# Patient Record
Sex: Male | Born: 1950
Health system: Southern US, Community
[De-identification: ages and names within clinical notes are randomized; demographics above are authoritative.]

## PROBLEM LIST (undated history)

## (undated) DIAGNOSIS — E119 Type 2 diabetes mellitus without complications: Secondary | ICD-10-CM

## (undated) DIAGNOSIS — N200 Calculus of kidney: Secondary | ICD-10-CM

## (undated) DIAGNOSIS — N189 Chronic kidney disease, unspecified: Secondary | ICD-10-CM

## (undated) DIAGNOSIS — K635 Polyp of colon: Secondary | ICD-10-CM

## (undated) DIAGNOSIS — E0590 Thyrotoxicosis, unspecified without thyrotoxic crisis or storm: Secondary | ICD-10-CM

## (undated) DIAGNOSIS — I10 Essential (primary) hypertension: Secondary | ICD-10-CM

## (undated) HISTORY — PX: CYSTOSCOPY: SUR368

## (undated) HISTORY — PX: LITHOTRIPSY: SUR834

## (undated) HISTORY — PX: COLON SURGERY: SHX602

## (undated) HISTORY — DX: Polyp of colon: K63.5

## (undated) HISTORY — DX: Calculus of kidney: N20.0

## (undated) HISTORY — PX: APPENDECTOMY: SHX54

---

## 1998-04-01 ENCOUNTER — Ambulatory Visit (HOSPITAL_COMMUNITY): Admission: RE | Admit: 1998-04-01 | Discharge: 1998-04-01 | Payer: Self-pay | Admitting: Urology

## 1998-04-01 ENCOUNTER — Encounter: Payer: Self-pay | Admitting: Urology

## 1998-04-03 ENCOUNTER — Ambulatory Visit (HOSPITAL_COMMUNITY): Admission: RE | Admit: 1998-04-03 | Discharge: 1998-04-03 | Payer: Self-pay | Admitting: Urology

## 2000-02-08 ENCOUNTER — Ambulatory Visit (HOSPITAL_COMMUNITY): Admission: RE | Admit: 2000-02-08 | Discharge: 2000-02-08 | Payer: Self-pay | Admitting: Endocrinology

## 2000-02-08 ENCOUNTER — Encounter: Payer: Self-pay | Admitting: Endocrinology

## 2000-11-23 ENCOUNTER — Encounter (INDEPENDENT_AMBULATORY_CARE_PROVIDER_SITE_OTHER): Payer: Self-pay | Admitting: Specialist

## 2000-11-23 ENCOUNTER — Ambulatory Visit (HOSPITAL_COMMUNITY): Admission: RE | Admit: 2000-11-23 | Discharge: 2000-11-23 | Payer: Self-pay | Admitting: *Deleted

## 2003-02-21 ENCOUNTER — Encounter: Admission: RE | Admit: 2003-02-21 | Discharge: 2003-05-22 | Payer: Self-pay | Admitting: *Deleted

## 2003-05-24 ENCOUNTER — Emergency Department (HOSPITAL_COMMUNITY): Admission: EM | Admit: 2003-05-24 | Discharge: 2003-05-24 | Payer: Self-pay | Admitting: Emergency Medicine

## 2004-07-13 ENCOUNTER — Ambulatory Visit (HOSPITAL_COMMUNITY): Admission: RE | Admit: 2004-07-13 | Discharge: 2004-07-13 | Payer: Self-pay | Admitting: *Deleted

## 2005-04-23 ENCOUNTER — Encounter: Admission: RE | Admit: 2005-04-23 | Discharge: 2005-06-20 | Payer: Self-pay | Admitting: *Deleted

## 2008-03-14 ENCOUNTER — Ambulatory Visit (HOSPITAL_COMMUNITY): Admission: RE | Admit: 2008-03-14 | Discharge: 2008-03-14 | Payer: Self-pay | Admitting: Urology

## 2008-03-21 ENCOUNTER — Ambulatory Visit (HOSPITAL_COMMUNITY): Admission: RE | Admit: 2008-03-21 | Discharge: 2008-03-21 | Payer: Self-pay | Admitting: Urology

## 2008-07-04 ENCOUNTER — Encounter: Admission: RE | Admit: 2008-07-04 | Discharge: 2008-07-04 | Payer: Self-pay | Admitting: Family Medicine

## 2008-11-21 DIAGNOSIS — E278 Other specified disorders of adrenal gland: Secondary | ICD-10-CM | POA: Insufficient documentation

## 2008-11-21 DIAGNOSIS — R809 Proteinuria, unspecified: Secondary | ICD-10-CM | POA: Insufficient documentation

## 2010-11-26 ENCOUNTER — Other Ambulatory Visit: Payer: Self-pay | Admitting: Internal Medicine

## 2010-11-26 DIAGNOSIS — D3500 Benign neoplasm of unspecified adrenal gland: Secondary | ICD-10-CM

## 2010-12-04 ENCOUNTER — Ambulatory Visit
Admission: RE | Admit: 2010-12-04 | Discharge: 2010-12-04 | Disposition: A | Discharge status: Home / Self Care | Payer: BC Managed Care – PPO | Source: Ambulatory Visit | Attending: Internal Medicine | Admitting: Internal Medicine

## 2010-12-04 DIAGNOSIS — D3500 Benign neoplasm of unspecified adrenal gland: Secondary | ICD-10-CM

## 2012-01-20 ENCOUNTER — Other Ambulatory Visit: Payer: Self-pay | Admitting: Internal Medicine

## 2012-01-20 DIAGNOSIS — IMO0002 Reserved for concepts with insufficient information to code with codable children: Secondary | ICD-10-CM

## 2012-01-25 ENCOUNTER — Other Ambulatory Visit: Payer: BC Managed Care – PPO

## 2012-01-26 ENCOUNTER — Ambulatory Visit
Admission: RE | Admit: 2012-01-26 | Discharge: 2012-01-26 | Disposition: A | Discharge status: Home / Self Care | Payer: BC Managed Care – PPO | Source: Ambulatory Visit | Attending: Internal Medicine | Admitting: Internal Medicine

## 2012-01-26 ENCOUNTER — Ambulatory Visit: Admission: RE | Admit: 2012-01-26 | Payer: BC Managed Care – PPO | Source: Ambulatory Visit

## 2012-01-26 DIAGNOSIS — IMO0002 Reserved for concepts with insufficient information to code with codable children: Secondary | ICD-10-CM

## 2012-03-22 ENCOUNTER — Other Ambulatory Visit: Payer: Self-pay | Admitting: Urology

## 2012-04-10 ENCOUNTER — Encounter (HOSPITAL_COMMUNITY): Payer: Self-pay | Admitting: Pharmacy Technician

## 2012-04-10 ENCOUNTER — Encounter (HOSPITAL_COMMUNITY): Payer: Self-pay | Admitting: *Deleted

## 2012-04-10 NOTE — Pre-Procedure Instructions (Addendum)
Asked to bring blue folder the day of the procedure,insurance card,I.D. driver's license,wear comfortable clothing and have a driver for the day. Asked not to take Advil,Motrin,Ibuprofen,Aleve or any NSAIDS, Aspirin, or Toradol for 72 hours prior to procedure,  No vitamins or herbal medications 7 days prior to procedure. Will hold Metformin the am of procedure Instructed to take laxative per doctor's office instructions and eat a light dinner the evening before procedure.   To arrive at 0645 for lithotripsy procedure.

## 2012-04-17 ENCOUNTER — Encounter (HOSPITAL_COMMUNITY)
Admission: RE | Disposition: A | Discharge status: Home / Self Care | Payer: Self-pay | Source: Ambulatory Visit | Attending: Urology

## 2012-04-17 ENCOUNTER — Ambulatory Visit (HOSPITAL_COMMUNITY): Payer: BC Managed Care – PPO

## 2012-04-17 ENCOUNTER — Encounter (HOSPITAL_COMMUNITY): Payer: Self-pay | Admitting: *Deleted

## 2012-04-17 ENCOUNTER — Ambulatory Visit (HOSPITAL_COMMUNITY)
Admission: RE | Admit: 2012-04-17 | Discharge: 2012-04-17 | Disposition: A | Discharge status: Home / Self Care | Payer: BC Managed Care – PPO | Source: Ambulatory Visit | Attending: Urology | Admitting: Urology

## 2012-04-17 DIAGNOSIS — N189 Chronic kidney disease, unspecified: Secondary | ICD-10-CM | POA: Insufficient documentation

## 2012-04-17 DIAGNOSIS — E119 Type 2 diabetes mellitus without complications: Secondary | ICD-10-CM | POA: Insufficient documentation

## 2012-04-17 DIAGNOSIS — I129 Hypertensive chronic kidney disease with stage 1 through stage 4 chronic kidney disease, or unspecified chronic kidney disease: Secondary | ICD-10-CM | POA: Insufficient documentation

## 2012-04-17 DIAGNOSIS — E0590 Thyrotoxicosis, unspecified without thyrotoxic crisis or storm: Secondary | ICD-10-CM | POA: Insufficient documentation

## 2012-04-17 DIAGNOSIS — Z79899 Other long term (current) drug therapy: Secondary | ICD-10-CM | POA: Insufficient documentation

## 2012-04-17 DIAGNOSIS — N200 Calculus of kidney: Secondary | ICD-10-CM | POA: Insufficient documentation

## 2012-04-17 HISTORY — DX: Type 2 diabetes mellitus without complications: E11.9

## 2012-04-17 HISTORY — DX: Thyrotoxicosis, unspecified without thyrotoxic crisis or storm: E05.90

## 2012-04-17 HISTORY — DX: Chronic kidney disease, unspecified: N18.9

## 2012-04-17 HISTORY — DX: Essential (primary) hypertension: I10

## 2012-04-17 SURGERY — LITHOTRIPSY, ESWL
Anesthesia: LOCAL | Laterality: Right

## 2012-04-17 MED ORDER — DEXTROSE-NACL 5-0.45 % IV SOLN
INTRAVENOUS | Status: DC
  Administered 2012-04-17: 08:00:00 via INTRAVENOUS

## 2012-04-17 MED ORDER — ACETAMINOPHEN 650 MG RE SUPP
650.0000 mg | RECTAL | Status: DC | PRN
  Filled 2012-04-17: qty 1

## 2012-04-17 MED ORDER — ACETAMINOPHEN 325 MG PO TABS: 650.0000 mg | ORAL_TABLET | ORAL | Status: DC | PRN

## 2012-04-17 MED ORDER — ACETAMINOPHEN 10 MG/ML IV SOLN: 1000.0000 mg | Freq: Once | INTRAVENOUS | Status: DC

## 2012-04-17 MED ORDER — ONDANSETRON HCL 4 MG/2ML IJ SOLN: 4.0000 mg | Freq: Four times a day (QID) | INTRAMUSCULAR | Status: DC | PRN

## 2012-04-17 MED ORDER — MORPHINE SULFATE 10 MG/ML IJ SOLN: 2.0000 mg | INTRAMUSCULAR | Status: DC | PRN

## 2012-04-17 MED ORDER — SODIUM CHLORIDE 0.9 % IJ SOLN: 3.0000 mL | Freq: Two times a day (BID) | INTRAMUSCULAR | Status: DC

## 2012-04-17 MED ORDER — SODIUM CHLORIDE 0.9 % IJ SOLN: 3.0000 mL | INTRAMUSCULAR | Status: DC | PRN

## 2012-04-17 MED ORDER — DIPHENHYDRAMINE HCL 25 MG PO CAPS
25.0000 mg | ORAL_CAPSULE | ORAL | Status: AC
  Administered 2012-04-17: 25 mg via ORAL
  Filled 2012-04-17: qty 1

## 2012-04-17 MED ORDER — OXYCODONE HCL 5 MG PO TABS: 5.0000 mg | ORAL_TABLET | ORAL | Status: DC | PRN

## 2012-04-17 MED ORDER — DIAZEPAM 5 MG PO TABS
10.0000 mg | ORAL_TABLET | ORAL | Status: AC
  Administered 2012-04-17: 10 mg via ORAL
  Filled 2012-04-17: qty 2

## 2012-04-17 MED ORDER — SODIUM CHLORIDE 0.9 % IV SOLN: 250.0000 mL | INTRAVENOUS | Status: DC | PRN

## 2012-04-17 NOTE — H&P (Signed)
  Urology History and Physical Exam  CC: bilateral renal calculi  HPI: 61 year old male presents at this time for lithotripsy management of enlarging right renal calculi. The patient recently presented with back pain. He has a prior history of urolithiasis. Compared to old films, his stones are larger bilaterally. As the right side stones are predominant, we will be treating the right-sided calculi first. He has an 11 x 14 and a 4 mm stone on the right, as well as left sided urolithiasis. He does have intermittent back pain which he attributes to the stones. He has had no gross hematuria or dysuria, no nausea or vomiting.  PMH: Past Medical History  Diagnosis Date  . Hypertension   . Chronic kidney disease   . Hyperthyroidism   . Diabetes mellitus without complication     does not check cbg daily    PSH: Past Surgical History  Procedure Date  . Appendectomy   . Cystoscopy   . Lithotripsy     twice  . Lithotripsy     Allergies: No Known Allergies  Medications: Prescriptions prior to admission  Medication Sig Dispense Refill  . atorvastatin (LIPITOR) 20 MG tablet Take 20 mg by mouth daily.      Marland Kitchen levothyroxine (SYNTHROID, LEVOTHROID) 100 MCG tablet Take 100 mcg by mouth daily.      Marland Kitchen lisinopril-hydrochlorothiazide (PRINZIDE,ZESTORETIC) 10-12.5 MG per tablet Take 1 tablet by mouth daily.      . metFORMIN (GLUCOPHAGE) 850 MG tablet Take 850 mg by mouth 2 (two) times daily with a meal.         Social History: History   Social History  . Marital Status: Married    Spouse Name: N/A    Number of Children: N/A  . Years of Education: N/A   Occupational History  . Not on file.   Social History Main Topics  . Smoking status: Current Every Day Smoker -- 1.0 packs/day  . Smokeless tobacco: Never Used  . Alcohol Use: No  . Drug Use: No  . Sexually Active:    Other Topics Concern  . Not on file   Social History Narrative  . No narrative on file    Family  History: History reviewed. No pertinent family history.  Review of Systems: Positive: back pain Negative:   A further 10 point review of systems was negative except what is listed in the HPI.  Physical Exam: @VITALS2 @ General: No acute distress.  Awake. Head:  Normocephalic.  Atraumatic. ENT:  EOMI.  Mucous membranes moist Neck:  Supple.  No lymphadenopathy. CV:  S1 present. S2 present. Regular rate. Pulmonary: Equal effort bilaterally.  Clear to auscultation bilaterally. Abdomen: Soft.  Non tender to palpation. Skin:  Normal turgor.  No visible rash. Extremity: No gross deformity of bilateral upper extremities.  No gross deformity of    bilateral lower extremities. Neurologic: Alert. Appropriate mood.    Studies:  No results found for this basename: HGB:2,WBC:2,PLT:2 in the last 72 hours  No results found for this basename: NA:2,K:2,CL:2,CO2:2,BUN:2,CREATININE:2,CALCIUM:2,MAGNESIUM:2,GFRNONAA:2,GFRAA:2 in the last 72 hours   No results found for this basename: PT:2,INR:2,APTT:2 in the last 72 hours   No components found with this basename: ABG:2    Assessment:  1. Enlarging right renal calculi  2. Enlarging left renal calculi  Plan: Lithotripsy of right renal calculi followed by contralateral procedure in the future.

## 2012-04-18 ENCOUNTER — Encounter (HOSPITAL_COMMUNITY): Payer: Self-pay

## 2012-04-21 ENCOUNTER — Emergency Department (HOSPITAL_COMMUNITY): Payer: BC Managed Care – PPO

## 2012-04-21 ENCOUNTER — Encounter (HOSPITAL_COMMUNITY): Payer: Self-pay | Admitting: *Deleted

## 2012-04-21 ENCOUNTER — Emergency Department (HOSPITAL_COMMUNITY)
Admission: EM | Admit: 2012-04-21 | Discharge: 2012-04-21 | Disposition: A | Discharge status: Home / Self Care | Payer: BC Managed Care – PPO | Attending: Emergency Medicine | Admitting: Emergency Medicine

## 2012-04-21 DIAGNOSIS — E119 Type 2 diabetes mellitus without complications: Secondary | ICD-10-CM | POA: Insufficient documentation

## 2012-04-21 DIAGNOSIS — E0590 Thyrotoxicosis, unspecified without thyrotoxic crisis or storm: Secondary | ICD-10-CM | POA: Insufficient documentation

## 2012-04-21 DIAGNOSIS — F17200 Nicotine dependence, unspecified, uncomplicated: Secondary | ICD-10-CM | POA: Insufficient documentation

## 2012-04-21 DIAGNOSIS — N200 Calculus of kidney: Secondary | ICD-10-CM | POA: Insufficient documentation

## 2012-04-21 DIAGNOSIS — N201 Calculus of ureter: Secondary | ICD-10-CM | POA: Insufficient documentation

## 2012-04-21 DIAGNOSIS — N1330 Unspecified hydronephrosis: Secondary | ICD-10-CM | POA: Insufficient documentation

## 2012-04-21 DIAGNOSIS — Z79899 Other long term (current) drug therapy: Secondary | ICD-10-CM | POA: Insufficient documentation

## 2012-04-21 DIAGNOSIS — I10 Essential (primary) hypertension: Secondary | ICD-10-CM | POA: Insufficient documentation

## 2012-04-21 DIAGNOSIS — N189 Chronic kidney disease, unspecified: Secondary | ICD-10-CM | POA: Insufficient documentation

## 2012-04-21 LAB — POCT I-STAT, CHEM 8
BUN: 19 mg/dL (ref 6–23)
Calcium, Ion: 1.22 mmol/L (ref 1.13–1.30)
Chloride: 100 mEq/L (ref 96–112)
Creatinine, Ser: 1.3 mg/dL (ref 0.50–1.35)
Sodium: 136 mEq/L (ref 135–145)

## 2012-04-21 MED ORDER — KETOROLAC TROMETHAMINE 30 MG/ML IJ SOLN
30.0000 mg | Freq: Once | INTRAMUSCULAR | Status: AC
  Administered 2012-04-21: 30 mg via INTRAVENOUS
  Filled 2012-04-21: qty 1

## 2012-04-21 MED ORDER — HYDROMORPHONE HCL PF 1 MG/ML IJ SOLN
1.0000 mg | Freq: Once | INTRAMUSCULAR | Status: AC
  Administered 2012-04-21: 1 mg via INTRAVENOUS
  Filled 2012-04-21: qty 1

## 2012-04-21 MED ORDER — PROMETHAZINE HCL 25 MG PO TABS: 25.0000 mg | ORAL_TABLET | Freq: Four times a day (QID) | ORAL | Status: DC | PRN

## 2012-04-21 MED ORDER — ONDANSETRON HCL 4 MG/2ML IJ SOLN
4.0000 mg | Freq: Once | INTRAMUSCULAR | Status: AC
  Administered 2012-04-21: 4 mg via INTRAVENOUS
  Filled 2012-04-21: qty 2

## 2012-04-21 MED ORDER — OXYCODONE-ACETAMINOPHEN 10-325 MG PO TABS: 1.0000 | ORAL_TABLET | ORAL | Status: DC | PRN

## 2012-04-21 NOTE — ED Notes (Signed)
Pt had lithotripsy on Monday; tonight since 7pm has had severe right lower abd pain; urinating very little; nausea

## 2012-04-21 NOTE — ED Provider Notes (Signed)
History     CSN: [id number]  Arrival date & time 04/21/12  0107   First MD Initiated Contact with Patient 04/21/12 0133      Chief Complaint  Patient presents with  . Nephrolithiasis    (Consider location/radiation/quality/duration/timing/severity/associated sxs/prior treatment) HPI Comments: 61 year old male with a history of recurrent kidney stones who presents approximately 3 days after undergoing lithotripsy for enlarging right renal calculi. He had an 11 x 14 mm kidney stone at that time. He states it is been pain-free for the last 2 days but approximately 4 hours ago he developed acute onset of right flank pain with radiation to the right lower quadrant which is intermittent, fluctuating, sharp, severe and associated with nausea vomiting and diaphoresis. This is similar to the kidney stones he's had in the past, he took several doses of pain medication Percocet prior to arrival without improvement.  He denies fevers, chills, cough, shortness of breath, rashes, swelling, dysuria, diarrhea, hematuria  The history is provided by the patient, a relative and medical records.    Past Medical History  Diagnosis Date  . Hypertension   . Chronic kidney disease   . Hyperthyroidism   . Diabetes mellitus without complication     does not check cbg daily    Past Surgical History  Procedure Date  . Appendectomy   . Cystoscopy   . Lithotripsy     twice  . Lithotripsy     No family history on file.  History  Substance Use Topics  . Smoking status: Current Every Day Smoker -- 1.0 packs/day  . Smokeless tobacco: Never Used  . Alcohol Use: No      Review of Systems  All other systems reviewed and are negative.    Allergies  Review of patient's allergies indicates no known allergies.  Home Medications   Current Outpatient Rx  Name Route Sig Dispense Refill  . ATORVASTATIN CALCIUM 20 MG PO TABS Oral Take 20 mg by mouth daily.    Marland Kitchen LEVOTHYROXINE SODIUM 100 MCG PO TABS  Oral Take 100 mcg by mouth daily.    Marland Kitchen LISINOPRIL-HYDROCHLOROTHIAZIDE 10-12.5 MG PO TABS Oral Take 1 tablet by mouth daily.    Marland Kitchen METFORMIN HCL 850 MG PO TABS Oral Take 850 mg by mouth 2 (two) times daily with a meal.    . OXYCODONE-ACETAMINOPHEN 5-325 MG PO TABS Oral Take 2 tablets by mouth every 4 (four) hours as needed. pain    . OXYCODONE-ACETAMINOPHEN 10-325 MG PO TABS Oral Take 1 tablet by mouth every 4 (four) hours as needed for pain. 30 tablet 0  . PROMETHAZINE HCL 25 MG PO TABS Oral Take 1 tablet (25 mg total) by mouth every 6 (six) hours as needed for nausea. 12 tablet 0    BP 188/87  Pulse 64  Temp 98.4 F (36.9 C) (Oral)  Resp 18  SpO2 94%  Physical Exam  Nursing note and vitals reviewed. Constitutional: He appears well-developed and well-nourished. No distress.  HENT:  Head: Normocephalic and atraumatic.  Mouth/Throat: Oropharynx is clear and moist. No oropharyngeal exudate.  Eyes: Conjunctivae normal and EOM are normal. Pupils are equal, round, and reactive to light. Right eye exhibits no discharge. Left eye exhibits no discharge. No scleral icterus.  Neck: Normal range of motion. Neck supple. No JVD present. No thyromegaly present.  Cardiovascular: Normal rate, regular rhythm, normal heart sounds and intact distal pulses.  Exam reveals no gallop and no friction rub.   No murmur heard. Pulmonary/Chest: Effort normal  and breath sounds normal. No respiratory distress. He has no wheezes. He has no rales.  Abdominal: Soft. Bowel sounds are normal. He exhibits no distension and no mass. There is no tenderness.       Soft and nontender abdomen without any focal tenderness to palpation. No peritoneal signs, normal bowel sounds, no guarding, no masses palpated. No hernias visualized or palpated  No CVA tenderness  Musculoskeletal: Normal range of motion. He exhibits no edema and no tenderness.  Lymphadenopathy:    He has no cervical adenopathy.  Neurological: He is alert.  Coordination normal.  Skin: Skin is warm and dry. No rash noted. No erythema.  Psychiatric: He has a normal mood and affect. His behavior is normal.    ED Course  Procedures (including critical care time)  Labs Reviewed  POCT I-STAT, CHEM 8 - Abnormal; Notable for the following:    Glucose, Bld 246 (*)     All other components within normal limits  URINALYSIS, ROUTINE W REFLEX MICROSCOPIC   Ct Abdomen Pelvis Wo Contrast  04/21/2012  *RADIOLOGY REPORT*  Clinical Data: Right lower abdominal pain and nausea.  CT ABDOMEN AND PELVIS WITHOUT CONTRAST  Technique:  Multidetector CT imaging of the abdomen and pelvis was performed following the standard protocol without intravenous contrast.  Comparison: KUB 04/17/2012 and CT abdomen 01/26/2012.  Findings: Dependent atelectasis is seen in the lung bases.  No pleural or pericardial effusion.  The patient has moderate right hydronephrosis with stranding about the right kidney and ureter due to a large stone in the distal right ureter.  The stone measures approximately 0.5 cm transverse by 3.0 cm cranial-caudal. Approximately five nonobstructing right renal stones are identified.  The largest is in the lower pole measuring 0.8 cm.  A nonobstructing stone in the upper pole left kidney measures 1.1 cm in diameter.  There is an unchanged left renal cyst.  The liver , gallbladder, spleen, right adrenal gland and pancreas appear normal.  Small left adrenal adenoma is unchanged.  Urinary bladder appears normal.  Seminal vesicles and prostate gland are unremarkable.  The stomach and small and large bowel are unremarkable.  No lymphadenopathy or fluid.  No focal bony abnormality  IMPRESSION: Moderate right hydronephrosis with extensive stranding about the right kidney and ureter due to a large distal ureteral stone.  The stone extends for approximately 3 cm cranial-caudal in the right ureter.  Multiple right and single left nonobstructing renal stones are described above.    Original Report Authenticated By: Holley Dexter, M.D.      1. Hydronephrosis, right   2. Ureterolithiasis       MDM  At this time the patient is well appearing though he does have mild hypertension, will check CT scan to see if he has obstructive uropathy, check renal function and urinalysis.  CT scan shows hydronephrosis and hydroureter on the right, renal function has been preserved and the patient feels better after medications. According to the CT scan there is a stone burden in the distal right ureter and after discussion with the urologist Dr. Vernie Ammons, patient appears stable for discharge to followup in the office. Oxycodone 10 mg with Tylenol given in place of the oxycodone 5 mg as the patient needs increased pain control.     Vida Roller, MD 04/21/12 [phone]

## 2012-05-24 ENCOUNTER — Other Ambulatory Visit: Payer: Self-pay | Admitting: Urology

## 2012-05-25 ENCOUNTER — Encounter (HOSPITAL_COMMUNITY): Payer: Self-pay | Admitting: *Deleted

## 2012-05-25 NOTE — Pre-Procedure Instructions (Signed)
Asked to bring blue folder the day of the procedure,insurance card,I.D. driver's license,wear comfortable clothing and have a driver for the day. Asked not to take Advil,Motrin,Ibuprofen,Aleve or any NSAIDS, Aspirin, or Toradol for 72 hours prior to procedure,  No vitamins or herbal medications 7 days prior to procedure. Instructed to take laxative per doctor's office instructions and eat a light dinner the evening before procedure.   To arrive at 0530  for lithotripsy procedure. 

## 2012-06-05 ENCOUNTER — Ambulatory Visit (HOSPITAL_COMMUNITY)
Admission: RE | Admit: 2012-06-05 | Discharge: 2012-06-05 | Disposition: A | Discharge status: Home / Self Care | Payer: BC Managed Care – PPO | Source: Ambulatory Visit | Attending: Urology | Admitting: Urology

## 2012-06-05 ENCOUNTER — Encounter (HOSPITAL_COMMUNITY)
Admission: RE | Disposition: A | Discharge status: Home / Self Care | Payer: Self-pay | Source: Ambulatory Visit | Attending: Urology

## 2012-06-05 ENCOUNTER — Encounter (HOSPITAL_COMMUNITY): Payer: Self-pay | Admitting: *Deleted

## 2012-06-05 ENCOUNTER — Ambulatory Visit (HOSPITAL_COMMUNITY): Payer: BC Managed Care – PPO

## 2012-06-05 DIAGNOSIS — F17200 Nicotine dependence, unspecified, uncomplicated: Secondary | ICD-10-CM | POA: Insufficient documentation

## 2012-06-05 DIAGNOSIS — E119 Type 2 diabetes mellitus without complications: Secondary | ICD-10-CM | POA: Insufficient documentation

## 2012-06-05 DIAGNOSIS — Z79899 Other long term (current) drug therapy: Secondary | ICD-10-CM | POA: Insufficient documentation

## 2012-06-05 DIAGNOSIS — I10 Essential (primary) hypertension: Secondary | ICD-10-CM | POA: Insufficient documentation

## 2012-06-05 DIAGNOSIS — N200 Calculus of kidney: Secondary | ICD-10-CM | POA: Insufficient documentation

## 2012-06-05 HISTORY — DX: Calculus of kidney: N20.0

## 2012-06-05 SURGERY — LITHOTRIPSY, ESWL
Anesthesia: LOCAL | Laterality: Left

## 2012-06-05 MED ORDER — OXYCODONE-ACETAMINOPHEN 5-325 MG PO TABS: 1.0000 | ORAL_TABLET | ORAL | Status: DC | PRN

## 2012-06-05 MED ORDER — DIPHENHYDRAMINE HCL 25 MG PO CAPS
25.0000 mg | ORAL_CAPSULE | ORAL | Status: AC
  Administered 2012-06-05: 25 mg via ORAL
  Filled 2012-06-05: qty 1

## 2012-06-05 MED ORDER — DEXTROSE-NACL 5-0.45 % IV SOLN
INTRAVENOUS | Status: DC
  Administered 2012-06-05: 07:00:00 via INTRAVENOUS

## 2012-06-05 MED ORDER — DIAZEPAM 5 MG PO TABS
10.0000 mg | ORAL_TABLET | ORAL | Status: AC
  Administered 2012-06-05: 10 mg via ORAL
  Filled 2012-06-05: qty 2

## 2012-06-05 MED ORDER — LEVOFLOXACIN 500 MG PO TABS
500.0000 mg | ORAL_TABLET | ORAL | Status: AC
  Administered 2012-06-05: 500 mg via ORAL
  Filled 2012-06-05: qty 1

## 2012-06-05 NOTE — Progress Notes (Signed)
Returned to short stay from [employer] truck. Pt is very sleepy but arousable. Lt flank bruised. And reddened.

## 2012-06-05 NOTE — H&P (Signed)
Urology History and Physical Exam  CC: Kidney stone  HPI: 61 year old male presents for lithotripsy of an enlarging left renal stone. He has a history of urolitiasis. He presented in September of 2013 with bilateral back pain. KUB revealed bilateral renal calculi which had enlarged since his last XR performed a year earlier. He had ESL of 2 right sided calculi on 10/28. He developed steinstrasse afterwards which was treated conservatively and resolved w/o surgical intervention. On the left, he has an 11 mm stone that has grown, and he desires ESL management of that as well.  PMH: Past Medical History  Diagnosis Date  . Hypertension   . Chronic kidney disease   . Hyperthyroidism   . Diabetes mellitus without complication     does not check cbg daily    PSH: Past Surgical History  Procedure Date  . Appendectomy   . Cystoscopy   . Lithotripsy     twice  . Lithotripsy     Allergies: No Known Allergies  Medications: Prescriptions prior to admission  Medication Sig Dispense Refill  . atorvastatin (LIPITOR) 20 MG tablet Take 20 mg by mouth daily.      Marland Kitchen levothyroxine (SYNTHROID, LEVOTHROID) 100 MCG tablet Take 100 mcg by mouth daily.      Marland Kitchen lisinopril-hydrochlorothiazide (PRINZIDE,ZESTORETIC) 10-12.5 MG per tablet Take 1 tablet by mouth daily.      . metFORMIN (GLUCOPHAGE) 850 MG tablet Take 850 mg by mouth 2 (two) times daily with a meal.      . oxyCODONE-acetaminophen (PERCOCET) 10-325 MG per tablet Take 1 tablet by mouth every 4 (four) hours as needed for pain.  30 tablet  0  . oxyCODONE-acetaminophen (PERCOCET/ROXICET) 5-325 MG per tablet Take 2 tablets by mouth every 4 (four) hours as needed. pain      . promethazine (PHENERGAN) 25 MG tablet Take 1 tablet (25 mg total) by mouth every 6 (six) hours as needed for nausea.  12 tablet  0     Social History: History   Social History  . Marital Status: Married    Spouse Name: N/A    Number of Children: N/A  . Years of  Education: N/A   Occupational History  . Not on file.   Social History Main Topics  . Smoking status: Current Every Day Smoker -- 1.0 packs/day  . Smokeless tobacco: Never Used  . Alcohol Use: No  . Drug Use: No  . Sexually Active:    Other Topics Concern  . Not on file   Social History Narrative  . No narrative on file    Family History: History reviewed. No pertinent family history.  Review of Systems: Positive: Back pain Negative:   A further 10 point review of systems was negative except what is listed in the HPI.  Physical Exam: @VITALS2 @ General: No acute distress.  Awake. Head:  Normocephalic.  Atraumatic. ENT:  EOMI.  Mucous membranes moist Neck:  Supple.  No lymphadenopathy. CV:  S1 present. S2 present. Regular rate. Pulmonary: Equal effort bilaterally.  Clear to auscultation bilaterally. Abdomen: Soft.  Non tender to palpation. Skin:  Normal turgor.  No visible rash. Extremity: No gross deformity of bilateral upper extremities.  No gross deformity of    bilateral lower extremities. Neurologic: Alert. Appropriate mood.   Studies:  No results found for this basename: HGB:2,WBC:2,PLT:2 in the last 72 hours  No results found for this basename: NA:2,K:2,CL:2,CO2:2,BUN:2,CREATININE:2,CALCIUM:2,MAGNESIUM:2,GFRNONAA:2,GFRAA:2 in the last 72 hours   No results found for this basename:  PT:2,INR:2,APTT:2 in the last 72 hours   No components found with this basename: ABG:2    Assessment:  11 mm left renal stone  Plan: ESL of 11 mm left renal stone

## 2012-11-27 ENCOUNTER — Encounter: Payer: BC Managed Care – PPO | Attending: Urology | Admitting: Dietician

## 2012-11-27 ENCOUNTER — Encounter: Payer: Self-pay | Admitting: Dietician

## 2012-11-27 VITALS — Ht 66.25 in | Wt 157.0 lb

## 2012-11-27 DIAGNOSIS — E7800 Pure hypercholesterolemia, unspecified: Secondary | ICD-10-CM | POA: Insufficient documentation

## 2012-11-27 DIAGNOSIS — Z713 Dietary counseling and surveillance: Secondary | ICD-10-CM | POA: Insufficient documentation

## 2012-11-27 DIAGNOSIS — E118 Type 2 diabetes mellitus with unspecified complications: Secondary | ICD-10-CM | POA: Insufficient documentation

## 2012-11-27 DIAGNOSIS — E785 Hyperlipidemia, unspecified: Secondary | ICD-10-CM

## 2012-11-27 DIAGNOSIS — E119 Type 2 diabetes mellitus without complications: Secondary | ICD-10-CM | POA: Insufficient documentation

## 2012-11-27 NOTE — Progress Notes (Signed)
Medical Nutrition Therapy:  Appt start time: 1630 end time:  1730.  Assessment:  Primary concerns today: high cholesterol, type II DM.   MEDICATIONS: see list.   MHx- Hypothyroid, HTN, Kidney stone, Hyperlipidemia   DIETARY INTAKE:  Usual eating pattern includes 3 meals and 2 snacks per day.  Everyday foods include coffee, cereal, jimmy dean sausage biscuit, low sugar cookie.  Avoided foods include high oxalate foods for kidney stone prevention (black tea, nuts and seeds, beets, etc.).    24-hr recall:  B ( AM): cereal (corn flakes) with 2 or 1% milk, coffee (equal and milk)  Snk ( AM): coffee and raisin cookie (low sugar) or jimmy dean sausage biscuit  L ( PM): meat or fish, vegetables, with a starch Snk ( PM): cookie or jimmy dean sausage biscuit, or fruit D ( PM): similar to lunch Snk ( PM): sometimes a dessert Beverages: buttermilk (8 oz.), coffee, diet green tea, diet cranberry juice, water  Usual physical activity: walk the dog sometimes, walks and has non-sedentary job doing maintenance work.   Pt expressed limited education of DM type II diet planning, CHO counting, physiology of DM. Pt wife also very concerned with kidney stones and HTN nutrition therapy.   Pt claims his BG readings are never higher than 160 mg/dL when tested from home.  Progress Towards Goal(s):  In progress.   Nutritional Diagnosis:  NB-1.1 Food and nutrition-related knowledge deficit As related to DM type II, hyperlipidemia, HTN.  As evidenced by pt and wife expressing little to no prior exposure to nutrition education in these areas.    Intervention:  Nutrition education provided regarding pathophysiology of type II DM, CHO counting, physical activity. Consistent CHO meal plan provided to fit current lifestyle of 3 meals and 2 snacks. Education provided regarding improving blood lipid profile with emphasis on limiting high saturated fat foods (fatty animal products, cream-based products), and choosing  more vegetables and whole grain options. HTN discussed briefly, with focus on limiting processed items, such as microwave-able meals, canned soups, canned veg, salty snack foods. Limiting caffeine drinks also discussed, with goal of 2 cups of coffee and tea or less per day.   Handouts given during visit include:  Type II DM basics  The Plate Method  Heart Healthful Eating to Improve Lipid Profile  Monitoring/Evaluation:  Dietary intake, exercise, portion control, and body weight in 3 months (per pt request).

## 2013-03-23 LAB — LIPID PANEL
Cholesterol: 199 mg/dL (ref 0–200)
HDL: 50 mg/dL (ref 35–70)
LDL Cholesterol: 116 mg/dL
Triglycerides: 164 mg/dL — AB (ref 40–160)

## 2013-03-23 LAB — HEMOGLOBIN A1C: HEMOGLOBIN A1C: 6.5 % — AB (ref 4.0–6.0)

## 2013-03-23 LAB — HEPATIC FUNCTION PANEL
ALT: 16 U/L (ref 10–40)
AST: 17 U/L (ref 14–40)
Alkaline Phosphatase: 64 U/L (ref 25–125)
Bilirubin, Total: 0.5 mg/dL

## 2013-03-23 LAB — BASIC METABOLIC PANEL
BUN: 15 mg/dL (ref 4–21)
CREATININE: 0.9 mg/dL (ref 0.6–1.3)
GLUCOSE: 97 mg/dL
POTASSIUM: 4.4 mmol/L (ref 3.4–5.3)
SODIUM: 140 mmol/L (ref 137–147)

## 2013-03-23 LAB — TSH: TSH: 6.58 u[IU]/mL — AB (ref 0.41–5.90)

## 2013-04-10 DIAGNOSIS — B351 Tinea unguium: Secondary | ICD-10-CM

## 2013-06-08 ENCOUNTER — Ambulatory Visit (INDEPENDENT_AMBULATORY_CARE_PROVIDER_SITE_OTHER): Payer: BC Managed Care – PPO | Admitting: Internal Medicine

## 2013-06-08 VITALS — BP 130/80 | HR 81 | Temp 102.1°F | Resp 18 | Ht 66.5 in | Wt 157.0 lb

## 2013-06-08 DIAGNOSIS — R109 Unspecified abdominal pain: Secondary | ICD-10-CM

## 2013-06-08 DIAGNOSIS — R05 Cough: Secondary | ICD-10-CM

## 2013-06-08 DIAGNOSIS — R319 Hematuria, unspecified: Secondary | ICD-10-CM

## 2013-06-08 DIAGNOSIS — R509 Fever, unspecified: Secondary | ICD-10-CM

## 2013-06-08 LAB — POCT UA - MICROSCOPIC ONLY
Casts, Ur, LPF, POC: NEGATIVE
Yeast, UA: NEGATIVE

## 2013-06-08 LAB — POCT CBC
MCH, POC: 30.4 pg (ref 27–31.2)
MID (cbc): 0.5 (ref 0–0.9)
MPV: 8.6 fL (ref 0–99.8)
POC MID %: 9.3 %M (ref 0–12)
Platelet Count, POC: 165 10*3/uL (ref 142–424)
RBC: 4.64 M/uL — AB (ref 4.69–6.13)
WBC: 5.7 10*3/uL (ref 4.6–10.2)

## 2013-06-08 LAB — POCT URINALYSIS DIPSTICK
Glucose, UA: 100
Leukocytes, UA: NEGATIVE
Nitrite, UA: NEGATIVE
Spec Grav, UA: 1.025
Urobilinogen, UA: 0.2

## 2013-06-08 MED ORDER — TRAMADOL HCL 50 MG PO TABS: 50.0000 mg | ORAL_TABLET | Freq: Three times a day (TID) | ORAL | Status: DC | PRN

## 2013-06-08 MED ORDER — CIPROFLOXACIN HCL 500 MG PO TABS: 500.0000 mg | ORAL_TABLET | Freq: Two times a day (BID) | ORAL | Status: DC

## 2013-06-08 NOTE — Progress Notes (Signed)
Subjective:    Patient ID: Ryan Ferguson, male    DOB: 12-18-1950, 62 y.o.   MRN: [id number] This chart was scribed for Ethelda Chick, MD by Clydene Laming, ED Scribe. This patient was seen in room 2 and the patient's care was started at 12:17 PM. HPI HPI Comments: Ryan Ferguson is a 62 y.o. male who presents to the [facility] complaining of a fever with associated flank pain, cough, and headache onset yesterday.  Pt denies frequency or dysuria. He had a kidney stone last year with similar symptoms and worries about this again.   His diabetes has been stable with Metformin. Followed at [facility] physicians but has had 3 different doctors in the last 5 years as his doctors continued to move away. He is very frustrated by this and would like new primary care.     Patient Active Problem List   Diagnosis Date Noted  . Type II or unspecified type diabetes mellitus without mention of complication, not stated as uncontrolled 11/27/2012   Past Medical History  Diagnosis Date  . Hypertension   . Chronic kidney disease   . Hyperthyroidism   . Diabetes mellitus without complication     does not check cbg daily  . Renal calculi    Past Surgical History  Procedure Laterality Date  . Appendectomy    . Cystoscopy    . Lithotripsy      twice  . Lithotripsy     No Known Allergies Prior to Admission medications   Medication Sig Start Date End Date Taking? Authorizing Provider  atorvastatin (LIPITOR) 20 MG tablet Take 20 mg by mouth every morning.    Yes Historical Provider, MD  levothyroxine (SYNTHROID, LEVOTHROID) 100 MCG tablet Take 100 mcg by mouth every morning.    Yes Historical Provider, MD  metFORMIN (GLUCOPHAGE) 850 MG tablet Take 850 mg by mouth 2 (two) times daily with a meal.   Yes Historical Provider, MD  oxyCODONE-acetaminophen (ROXICET) 5-325 MG per tablet Take 1 tablet by mouth every 4 (four) hours as needed for pain. 06/05/12  Yes Marcine Matar, MD    tamsulosin (FLOMAX) 0.4 MG CAPS Take by mouth.   Yes Historical Provider, MD  lisinopril-hydrochlorothiazide (PRINZIDE,ZESTORETIC) 10-12.5 MG per tablet Take 1 tablet by mouth every morning.     Historical Provider, MD       Review of Systems  Constitutional: Positive for fever. Negative for fatigue and unexpected weight change.  Respiratory: Negative for cough, chest tightness and shortness of breath.   Cardiovascular: Negative for chest pain, palpitations and leg swelling.  Genitourinary: Positive for flank pain. Negative for dysuria, frequency and difficulty urinating.       Objective:   Physical Exam  Constitutional: He is oriented to person, place, and time. He appears well-developed and well-nourished.  Appears uncomfortable  HENT:  Head: Normocephalic.  Right Ear: External ear normal.  Left Ear: External ear normal.  Nose: Nose normal.  Mouth/Throat: Oropharynx is clear and moist.  Copious clear rhinorrhea  Eyes: Conjunctivae and EOM are normal. Pupils are equal, round, and reactive to light.  Cardiovascular: Normal rate, regular rhythm and normal heart sounds.   Pulmonary/Chest: Effort normal and breath sounds normal. He has no wheezes. He has no rales.  Abdominal:  No flank pain to percussion  Lymphadenopathy:    He has no cervical adenopathy.  Neurological: He is alert and oriented to person, place, and time.  His wife helps with language barrier  Filed Vitals:   06/08/13 1055  BP: 130/80  Pulse: 81  Temp: 102.1 F (38.9 C)  TempSrc: Oral  Resp: 18  Height: 5' 6.5" (1.689 m)  Weight: 157 lb (71.215 kg)  SpO2: 99%   Results for orders placed in visit on 06/08/13  POCT CBC      Result Value Range   WBC 5.7  4.6 - 10.2 K/uL   Lymph, poc 1.0  0.6 - 3.4   POC LYMPH PERCENT 17.2  10 - 50 %L   MID (cbc) 0.5  0 - 0.9   POC MID % 9.3  0 - 12 %M   POC Granulocyte 4.2  2 - 6.9   Granulocyte percent 73.5  37 - 80 %G   RBC 4.64 (*) 4.69 - 6.13 M/uL    Hemoglobin 14.1  14.1 - 18.1 g/dL   HCT, POC 57.8  46.9 - 53.7 %   MCV 98.0 (*) 80 - 97 fL   MCH, POC 30.4  27 - 31.2 pg   MCHC 31.0 (*) 31.8 - 35.4 g/dL   RDW, POC 62.9     Platelet Count, POC 165  142 - 424 K/uL   MPV 8.6  0 - 99.8 fL  POCT UA - MICROSCOPIC ONLY      Result Value Range   WBC, Ur, HPF, POC 2-3     RBC, urine, microscopic 12-23     Bacteria, U Microscopic 1+     Mucus, UA pos     Epithelial cells, urine per micros 0-2     Crystals, Ur, HPF, POC neg     Casts, Ur, LPF, POC neg     Yeast, UA neg    POCT URINALYSIS DIPSTICK      Result Value Range   Color, UA yellow     Clarity, UA clear     Glucose, UA 100     Bilirubin, UA neg     Ketones, UA neg     Spec Grav, UA 1.025     Blood, UA small     pH, UA 5.5     Protein, UA 100     Urobilinogen, UA 0.2     Nitrite, UA neg     Leukocytes, UA Negative            Assessment & Plan:  Problem #1 fever secondary to influenza versus urinary tract infection Problem #2 persistent or recurrent hematuria without clinical evidence of nephrolithiasis   We'll culture  urine and cover with Cipro awaiting results  Meds ordered this encounter  Medications  . ciprofloxacin (CIPRO) 500 MG tablet    Sig: Take 1 tablet (500 mg total) by mouth 2 (two) times daily.    Dispense:  6 tablet    Refill:  0  . traMADol (ULTRAM) 50 MG tablet    Sig: Take 1-2 tablets (50-100 mg total) by mouth every 8 (eight) hours as needed. For pain of kidney stone    Dispense:  20 tablet    Refill:  0   He will call for appointment for primary care provider at 104   12:28 PM- Discussed treatment plan with pt at bedside. Pt verbalized understanding and agreement with plan.  I personally performed the services described in this documentation, which was scribed in my presence. The recorded information has been reviewed and is accurate.

## 2013-06-10 LAB — URINE CULTURE
Colony Count: NO GROWTH
Organism ID, Bacteria: NO GROWTH

## 2013-06-11 ENCOUNTER — Encounter: Payer: Self-pay | Admitting: Radiology

## 2013-06-20 NOTE — Progress Notes (Signed)
Left message for patient regarding scheduling appointment with Dr Audria Nine.

## 2013-06-28 ENCOUNTER — Telehealth: Payer: Self-pay | Admitting: Family Medicine

## 2013-06-28 NOTE — Telephone Encounter (Signed)
Message copied by Chinita Pester on Thu Jun 28, 2013  3:44 PM ------      Message from: Leandrew Koyanagi      Created: Fri Jun 08, 2013  1:25 PM       Needs routine appt to start primary care here at 104 for rx DM and HTN---? Dr Carlota Raspberry or Dr Leward Quan in [place] or feb ------

## 2013-06-28 NOTE — Telephone Encounter (Signed)
Pt's wife stated that he had been having CP for a couple days. I urged her to take him to the ER or to the urgent care. She understood

## 2013-07-09 ENCOUNTER — Ambulatory Visit (INDEPENDENT_AMBULATORY_CARE_PROVIDER_SITE_OTHER): Payer: BC Managed Care – PPO | Admitting: Family Medicine

## 2013-07-09 ENCOUNTER — Ambulatory Visit: Payer: BC Managed Care – PPO

## 2013-07-09 ENCOUNTER — Encounter: Payer: Self-pay | Admitting: Physician Assistant

## 2013-07-09 VITALS — BP 122/72 | HR 55 | Temp 98.0°F | Resp 16 | Ht 65.0 in | Wt 153.2 lb

## 2013-07-09 DIAGNOSIS — Z23 Encounter for immunization: Secondary | ICD-10-CM

## 2013-07-09 DIAGNOSIS — E119 Type 2 diabetes mellitus without complications: Secondary | ICD-10-CM

## 2013-07-09 DIAGNOSIS — I10 Essential (primary) hypertension: Secondary | ICD-10-CM

## 2013-07-09 DIAGNOSIS — E039 Hypothyroidism, unspecified: Secondary | ICD-10-CM

## 2013-07-09 DIAGNOSIS — R079 Chest pain, unspecified: Secondary | ICD-10-CM

## 2013-07-09 DIAGNOSIS — Z0000 Encounter for general adult medical examination without abnormal findings: Secondary | ICD-10-CM

## 2013-07-09 DIAGNOSIS — Z87442 Personal history of urinary calculi: Secondary | ICD-10-CM

## 2013-07-09 LAB — CBC
HEMATOCRIT: 40.3 % (ref 39.0–52.0)
HEMOGLOBIN: 14 g/dL (ref 13.0–17.0)
MCH: 30.2 pg (ref 26.0–34.0)
MCHC: 34.7 g/dL (ref 30.0–36.0)
MCV: 86.9 fL (ref 78.0–100.0)
Platelets: 247 10*3/uL (ref 150–400)
RBC: 4.64 MIL/uL (ref 4.22–5.81)
RDW: 13.7 % (ref 11.5–15.5)
WBC: 7.2 10*3/uL (ref 4.0–10.5)

## 2013-07-09 LAB — POCT URINALYSIS DIPSTICK
BILIRUBIN UA: NEGATIVE
Glucose, UA: NEGATIVE
KETONES UA: NEGATIVE
LEUKOCYTES UA: NEGATIVE
Nitrite, UA: NEGATIVE
PH UA: 5.5
SPEC GRAV UA: 1.025
Urobilinogen, UA: 0.2

## 2013-07-09 LAB — POCT UA - MICROSCOPIC ONLY
BACTERIA, U MICROSCOPIC: NEGATIVE
Casts, Ur, LPF, POC: NEGATIVE
Crystals, Ur, HPF, POC: NEGATIVE
Mucus, UA: NEGATIVE
Yeast, UA: NEGATIVE

## 2013-07-09 LAB — HEMOGLOBIN A1C
Hgb A1c MFr Bld: 7.5 % — ABNORMAL HIGH (ref <5.7)
Mean Plasma Glucose: 169 mg/dL — ABNORMAL HIGH (ref <117)

## 2013-07-09 MED ORDER — ZOSTER VACCINE LIVE 19400 UNT/0.65ML ~~LOC~~ SOLR: 0.6500 mL | Freq: Once | SUBCUTANEOUS | Status: DC

## 2013-07-09 NOTE — Progress Notes (Signed)
   Subjective:    Patient ID: Ryan Ferguson, male    DOB: [birth year], 63 y.o.   MRN: [id number]  HPI    Review of Systems  Constitutional: Negative.   HENT: Negative.   Eyes: Negative.   Respiratory: Negative.   Cardiovascular: Positive for chest pain.  Gastrointestinal: Positive for abdominal pain.  Endocrine: Negative.   Genitourinary: Negative.   Musculoskeletal: Negative.   Skin: Negative.   Allergic/Immunologic: Negative.   Neurological: Negative.   Hematological: Negative.   Psychiatric/Behavioral: Negative.        Objective:   Physical Exam        Assessment & Plan:

## 2013-07-09 NOTE — Progress Notes (Signed)
Patient ID: Ryan Ferguson MRN: [id number], DOB: [birth year] 63 y.o. Date of Encounter: 07/09/2013, 1:59 PM  Primary Physician: No primary provider on file.  Chief Complaint: Physical (CPE)  HPI: 63 y.o. male with history noted below here for CPE. Doing well. Last physical was "several years ago." He does have several items that he would like to discuss this afternoon in addition to his complete physical exam.   1) Diabetes mellitus: Followed by his endocrinologist with [facility], Dr. Tod Persia. He last saw Dr. Tod Persia in October 2014 and has follow up for October 2015. His current diabetes mellitus medication regimen includes metformin 850 mg bid and diet. His last A1C was 6.5% on 03/23/13. Urine microalbumin was 46.5, microalbumin/creatinine ratio 47.9, and urine creatinine of 97.1. He denies any polydipsia, polyphagia, polyuria, or nocturia. He does not need any refills at this time. He sometimes takes ASA 81 mg daily, and sometimes does not.   2) Hypothyroid: States he was originally diagnosed with this around 20 years ago. He has been on his current dose of levothyroxine 100 mcg for as long as he can remember. His last TSH on 03/23/13 was 6.580 uIU/mL (normal range 0.450-4.500). He remained on his current dose with this TSH level.   3) Hypertension: Currently on lisinopril 20 mg daily. Previously on lisinopril/HCTZ combo, however the HCTZ was stopped by his endocrinologist secondary to what they said as an elevated calcium, however upon review of the labs his calcium was found to be 26 mmol/L (normal range 18-29). This was stopped secondary to his predisposition to renal stones. His blood pressure has withstood this change.    4) Chest pain: Patient complains of 1 year history of chest pain that is increasing in frequency.He notes the pain comes on when he is working outside, especially when it is cold. The pain is substernal and does not radiate laterally, to his neck, shoulders, or arms. It  has never been associated with nausea, vomiting, or diaphoresis. The pain will last about 20 minutes when it comes on and self resolve. It does not come on when he is at rest. There is no associated SOB, wheezing, chest tightness, palpitations, or edema. He describes it as "pain." His paternal great grandfather did have 3 MI's, otherwise there is no family history of cardiac disease.   5) History of kidney stones: He has had 5 renal stones throughout his life time. Most recently he had 2 in 2013. One in October 2013, and one in November 2013. He is followed by [facility] and has a follow up Appointment with them in March 2015. He currently takes Flomax 0.4 mg qhs and is wondering if he still needs to take this. He has been on this since those 2 renal stones in 2013. He denies any decreased urine, difficulty urinating, or hematuria.   6) CPE: Last colonoscopy 2013. Patient and his wife report the findings to be some diverticula and 2 benign polyps. She is not certain if his next follow up is in 5 or 10 years. The patient and his wife state his last tetanus vaccine was over 20 years ago and they both request a tetanus vaccine for him.   Review of Systems: Consitutional: No fever, chills, fatigue, night sweats, lymphadenopathy, or weight changes. Eyes: No visual changes, eye redness, or discharge. ENT/Mouth: Ears: No otalgia, tinnitus, hearing loss, discharge. Nose: No congestion, rhinorrhea, sinus pain, or epistaxis. Throat: No sore throat, post nasal drip, or teeth pain. Cardiovascular: Positive for CP.No  palpitations, diaphoresis, DOE, edema, orthopnea, PND. Respiratory: No cough, hemoptysis, SOB, or wheezing. Gastrointestinal: Positive for abdominal pain. No anorexia, dysphagia, reflux, nausea, vomiting, hematemesis, diarrhea, constipation, BRBPR, or melena. Genitourinary: No dysuria, frequency, urgency, hematuria, incontinence, nocturia, decreased urinary stream, discharge, impotence, or  testicular pain/masses. Musculoskeletal: No decreased ROM, myalgias, stiffness, joint swelling, or weakness. Skin: No rash, erythema, lesion changes, pain, warmth, jaundice, or pruritis. Neurological: No headache, dizziness, syncope, seizures, tremors, memory loss, coordination problems, or paresthesias. Psychological: No anxiety, depression, hallucinations, SI/HI. Endocrine: No fatigue, polydipsia, polyphagia, polyuria, or known diabetes.   Past Medical History  Diagnosis Date  . Hypertension   . Chronic kidney disease   . Hyperthyroidism   . Diabetes mellitus without complication     does not check cbg daily  . Renal calculi      Past Surgical History  Procedure Laterality Date  . Appendectomy    . Cystoscopy    . Lithotripsy      twice  . Lithotripsy    . Colon surgery      Home Meds:  Prior to Admission medications   Medication Sig Start Date End Date Taking? Authorizing Provider  atorvastatin (LIPITOR) 20 MG tablet Take 20 mg by mouth every morning.    Yes Historical Provider, MD  cholecalciferol (VITAMIN D) 1000 UNITS tablet Take 1,000 Units by mouth daily.   Yes Historical Provider, MD  levothyroxine (SYNTHROID, LEVOTHROID) 100 MCG tablet Take 100 mcg by mouth every morning.    Yes Historical Provider, MD  lisinopril (PRINIVIL,ZESTRIL) 20 MG tablet Take 20 mg by mouth daily.   Yes Historical Provider, MD  metFORMIN (GLUCOPHAGE) 850 MG tablet Take 850 mg by mouth 2 (two) times daily with a meal.   Yes Historical Provider, MD  tamsulosin (FLOMAX) 0.4 MG CAPS Take by mouth.   Yes Historical Provider, MD    Allergies: No Known Allergies  History   Social History  . Marital Status: Married    Spouse Name: N/A    Number of Children: N/A  . Years of Education: N/A   Occupational History  . Not on file.   Social History Main Topics  . Smoking status: Current Every Day Smoker -- 1.00 packs/day  . Smokeless tobacco: Never Used  . Alcohol Use: No  . Drug Use: No    . Sexual Activity: Not on file   Other Topics Concern  . Not on file   Social History Narrative  . No narrative on file    Family History  Problem Relation Age of Onset  . Cancer Father   . High Cholesterol Father   . Diabetes Maternal Grandmother   . Cancer Maternal Grandfather   . Heart disease Maternal Grandfather   . Heart disease Paternal Grandmother     Physical Exam: Blood pressure 122/72, pulse 55, temperature 98 F (36.7 C), temperature source Oral, resp. rate 16, height 5\' 5"  (1.651 m), weight 153 lb 3.2 oz (69.491 kg), SpO2 96.00%.  General: Well developed, well nourished, in no acute distress. HEENT: Normocephalic, atraumatic. Conjunctiva pink, sclera non-icteric. Pupils 2 mm constricting to 1 mm, round, regular, and equally reactive to light and accomodation. EOMI. Internal auditory canal clear. TMs with good cone of light and without pathology. Nasal mucosa pink. Nares are without discharge. No sinus tenderness. Oral mucosa pink. Dentition normal. Pharynx without exudate.   Neck: Supple. Trachea midline. No thyromegaly. Full ROM. No lymphadenopathy. Lungs: Clear to auscultation bilaterally without wheezes, rales, or rhonchi. Breathing is of normal  effort and unlabored. Cardiovascular: RRR with S1 S2. No murmurs, rubs, or gallops appreciated. Distal pulses 2+ symmetrically. No carotid or abdominal bruits. Chest pain is not reproducible to palpation.  Abdomen: Soft, non-tender, non-distended with normoactive bowel sounds. No hepatosplenomegaly or masses. No rebound/guarding. No CVA tenderness. Without hernias. No midline pulsations.  Rectal: Patient declined.  Genitourinary: Patient declined.   Musculoskeletal: Full range of motion and 5/5 strength throughout. Without swelling, atrophy, tenderness, crepitus, or warmth. Extremities without clubbing, cyanosis, or edema. Calves supple. Skin: Warm and moist without erythema, ecchymosis, wounds, or rash. Neuro: A+Ox3. CN  II-XII grossly intact. Moves all extremities spontaneously. Full sensation throughout. Normal gait. DTR 2+ throughout upper and lower extremities. Finger to nose intact. Psych:  Responds to questions appropriately with a normal affect.   Studies:  Results for orders placed in visit on 07/09/13  POCT UA - MICROSCOPIC ONLY      Result Value Range   WBC, Ur, HPF, POC 0-1     RBC, urine, microscopic 0-3     Bacteria, U Microscopic neg     Mucus, UA neg     Epithelial cells, urine per micros 0-1     Crystals, Ur, HPF, POC neg     Casts, Ur, LPF, POC neg     Yeast, UA neg    POCT URINALYSIS DIPSTICK      Result Value Range   Color, UA yellow     Clarity, UA clear     Glucose, UA neg     Bilirubin, UA neg     Ketones, UA neg     Spec Grav, UA 1.025     Blood, UA trace     pH, UA 5.5     Protein, UA trace     Urobilinogen, UA 0.2     Nitrite, UA neg     Leukocytes, UA Negative       CBC, CMET, Lipid, PSA, A1C, and TSH all pending. Patient is not fasting. Patient's wife still insists that I run fasting labs.   EKG: Sinus bradycardia, no acute findings  CXR:  [facility] reading (PRIMARY) by  Dr. Carlota Raspberry. negative  Assessment/Plan:  63 y.o. male here for CPE with history of diabetes mellitus, hypothyroid, hypertension, history of kidney stones, now with chest pain, and language barrier   1) Chest pain -Cardiology referral  -Risk stratification  -ASA 81 mg daily -Avoid any strenuous activity until evaluation by cardiology   2) Diabetes mellitus -ASA 81 mg daily -Await labs -Followed by Dr. Tod Persia with [facility] -He sees his endocrinologist 1 time per year -Healthy diet and exercise -See an eye MD and DDS yearly  3) Hypertension  -Well controlled -Continue lisinopril 20 mg daily  -Healthy diet and exercise -Weight loss  4) Hypothyroid -Await labs -Labs from [facility] drawn in October 2014 indicate slightly elevated TSH of   5) History of kidney stones -Has appointment  with Urology in March 2015 -No kidney stones since 2013 -Discuss with Urology if they would like for him to stay on the Flomax -Continue to stay off the HCTZ at this time -He states his Urologist checks his prostate and performs his testicular exam, this he declined these today -Follow up with Urology regarding microscopic hematuria   6) CPE -TDaP -Zostavax As directed #1 no RF -Healthy diet and exercise -Weight loss -Age appropriate anticipatory guidance   7) Language barrier  -Patient's wife had to interpret several topics during the office visit  8) Greater than  45 minutes was spent face-to-face with the patient today with the above   Signed, Christell Faith, MHS, PA-C [facility], [place] [address] [address] [phone] 07/09/2013 1:59 PM

## 2013-07-10 ENCOUNTER — Encounter: Payer: Self-pay | Admitting: Family Medicine

## 2013-07-10 LAB — COMPREHENSIVE METABOLIC PANEL
ALT: 15 U/L (ref 0–53)
AST: 13 U/L (ref 0–37)
Albumin: 4.4 g/dL (ref 3.5–5.2)
Alkaline Phosphatase: 65 U/L (ref 39–117)
BILIRUBIN TOTAL: 0.4 mg/dL (ref 0.3–1.2)
BUN: 21 mg/dL (ref 6–23)
CALCIUM: 9.6 mg/dL (ref 8.4–10.5)
CHLORIDE: 104 meq/L (ref 96–112)
CO2: 24 meq/L (ref 19–32)
CREATININE: 0.89 mg/dL (ref 0.50–1.35)
GLUCOSE: 114 mg/dL — AB (ref 70–99)
Potassium: 4.1 mEq/L (ref 3.5–5.3)
Sodium: 140 mEq/L (ref 135–145)
Total Protein: 7.1 g/dL (ref 6.0–8.3)

## 2013-07-10 LAB — PSA: PSA: 2.49 ng/mL (ref <=4.00)

## 2013-07-10 LAB — TSH: TSH: 2.471 u[IU]/mL (ref 0.350–4.500)

## 2013-07-10 LAB — LIPID PANEL
CHOLESTEROL: 193 mg/dL (ref 0–200)
HDL: 44 mg/dL (ref >39)
LDL CALC: 118 mg/dL — AB (ref 0–99)
TRIGLYCERIDES: 154 mg/dL — AB (ref <150)
Total CHOL/HDL Ratio: 4.4 Ratio
VLDL: 31 mg/dL (ref 0–40)

## 2013-07-11 NOTE — Progress Notes (Signed)
EKG read and patient discussed with Christell Faith, PA-C. Agree with assessment and plan of care per his note.

## 2013-07-12 ENCOUNTER — Encounter: Payer: Self-pay | Admitting: Family Medicine

## 2013-07-30 ENCOUNTER — Institutional Professional Consult (permissible substitution): Payer: BC Managed Care – PPO | Admitting: Cardiology

## 2013-08-11 ENCOUNTER — Encounter: Payer: Self-pay | Admitting: Cardiology

## 2013-08-11 DIAGNOSIS — N189 Chronic kidney disease, unspecified: Secondary | ICD-10-CM | POA: Insufficient documentation

## 2013-08-11 DIAGNOSIS — I10 Essential (primary) hypertension: Secondary | ICD-10-CM | POA: Insufficient documentation

## 2013-08-11 DIAGNOSIS — E0590 Thyrotoxicosis, unspecified without thyrotoxic crisis or storm: Secondary | ICD-10-CM | POA: Insufficient documentation

## 2013-08-11 DIAGNOSIS — N182 Chronic kidney disease, stage 2 (mild): Secondary | ICD-10-CM | POA: Insufficient documentation

## 2013-08-11 DIAGNOSIS — N200 Calculus of kidney: Secondary | ICD-10-CM | POA: Insufficient documentation

## 2013-08-11 DIAGNOSIS — E119 Type 2 diabetes mellitus without complications: Secondary | ICD-10-CM | POA: Insufficient documentation

## 2013-08-15 ENCOUNTER — Institutional Professional Consult (permissible substitution): Payer: BC Managed Care – PPO | Admitting: Cardiology

## 2013-09-03 ENCOUNTER — Ambulatory Visit (INDEPENDENT_AMBULATORY_CARE_PROVIDER_SITE_OTHER): Payer: BC Managed Care – PPO | Admitting: Cardiology

## 2013-09-03 ENCOUNTER — Encounter: Payer: Self-pay | Admitting: Cardiology

## 2013-09-03 VITALS — BP 158/91 | HR 63 | Ht 65.0 in | Wt 154.0 lb

## 2013-09-03 DIAGNOSIS — R002 Palpitations: Secondary | ICD-10-CM

## 2013-09-03 DIAGNOSIS — E119 Type 2 diabetes mellitus without complications: Secondary | ICD-10-CM

## 2013-09-03 DIAGNOSIS — R079 Chest pain, unspecified: Secondary | ICD-10-CM

## 2013-09-03 NOTE — Patient Instructions (Signed)
Your physician recommends that you continue on your current medications as directed. Please refer to the Current Medication list given to you today.  Your physician has requested that you have en exercise stress myoview. For further information please visit [email or web address]. Please follow instruction sheet, as given.  Your physician recommends that you follow-up as needed.  

## 2013-09-03 NOTE — Progress Notes (Signed)
[facility] [address] [place], [place]  [address] Phone: [phone] Fax:  [phone]  Date:  09/03/2013   ID:  Ryan Ferguson, DOB [birth year], MRN [id number]  PCP:  No primary provider on file.   History of Present Illness: Ryan Ferguson is a 63 y.o. male with diabetes, hypertension, smoker, recently quit here for the evaluation of chest pain. Over the past year or so he has been having increasing chest discomfort. Usually comes on when it is cold outside and he is working. Pain is substernal, and sometimes radiates up to his neck, shoulders, arms. No associated nausea, vomiting or diaphoresis. Sometimes there is in cessation as though, "pumping up" is occurring and sometimes has sensation after eating. It seems to last approximately 2-20 minutes and then resolves on its own with rest. He does not seem to get it during rest. No other associated edema, wheezing, palpitations, shortness of breath. His paternal great-grandfather had 3 heart attacks otherwise no family history.   Wt Readings from Last 3 Encounters:  09/03/13 154 lb (69.854 kg)  07/09/13 153 lb 3.2 oz (69.491 kg)  06/08/13 157 lb (71.215 kg)     Past Medical History  Diagnosis Date  . Hypertension   . Chronic kidney disease   . Hyperthyroidism   . Diabetes mellitus without complication     does not check cbg daily  . Renal calculi     Past Surgical History  Procedure Laterality Date  . Appendectomy    . Cystoscopy    . Lithotripsy      twice  . Lithotripsy    . Colon surgery      Current Outpatient Prescriptions  Medication Sig Dispense Refill  . atorvastatin (LIPITOR) 20 MG tablet Take 20 mg by mouth every morning.       . cholecalciferol (VITAMIN D) 1000 UNITS tablet Take 1,000 Units by mouth daily.      Marland Kitchen levothyroxine (SYNTHROID, LEVOTHROID) 100 MCG tablet Take 100 mcg by mouth every morning.       Marland Kitchen lisinopril (PRINIVIL,ZESTRIL) 20 MG tablet Take 20 mg by mouth daily.      .  metFORMIN (GLUCOPHAGE) 850 MG tablet Take 850 mg by mouth 2 (two) times daily with a meal.      . zoster vaccine live, PF, (ZOSTAVAX) 69629 UNT/0.65ML injection Inject 19,400 Units into the skin once.  1 vial  0   No current facility-administered medications for this visit.    Allergies:   No Known Allergies  Social History:  The patient  reports that he has been smoking.  He has never used smokeless tobacco. He reports that he does not drink alcohol or use illicit drugs. Past 6 months to year not smoking.  Family History  Problem Relation Age of Onset  . Cancer Father     Pancreatic  . High Cholesterol Father   . Diabetes Maternal Grandmother   . Cancer Maternal Grandfather     Lung  . Heart disease Maternal Grandfather   . Heart disease Paternal Grandmother     ROS:  Please see the history of present illness.   Denies any fevers, chills, orthopnea, PND, syncope, bleeding   All other systems reviewed and negative.   PHYSICAL EXAM: VS:  BP 158/91  Pulse 63  Ht 5\' 5"  (1.651 m)  Wt 154 lb (69.854 kg)  BMI 25.63 kg/m2 Well nourished, well developed, in no acute distress HEENT: normal, [place]/AT, EOMI Neck: no  JVD, normal carotid upstroke, no bruit Cardiac:  normal S1, S2; RRR; no murmur Lungs:  clear to auscultation bilaterally, no wheezing, rhonchi or rales Abd: soft, nontender, no hepatomegaly, no bruits Ext: no edema, 2+ distal pulses Skin: warm and drySmall excoriation noted left posterior calf, upper back. Wife was concerned that this was correlating with shingles vaccination. GU: deferred Neuro: no focal abnormalities noted, AAO x 3  EKG:  07/09/13-sinus rhythm with no other abnormalities.    Labs: LDL 118, HDL 44, creatinine 0.89, hemoglobin 14  ASSESSMENT AND PLAN:  1. Chest pain-given his multiple risk factors of diabetes, hypertension, long-standing tobacco history (recently quit) I would like to proceed with nuclear stress test. His differential diagnosis includes  GERD, heartburn. 2. Palpitations-occasional skipped beat, separate from his chest discomfort, burning. Most likely PVCs or PACs. EKG reassuring. No high-risk symptoms such as syncope. Continue to monitor. Could consider beta blocker in the future. 3. Hypertension-continue with ACE inhibitor. May need further antihypertensives if blood pressure remains elevated. 4. Tobacco use-excellent cessation. 5. Diabetes-per primary team. ACE inhibitor for renal protection.  Signed, Candee Furbish, MD [facility]  09/03/2013 12:25 PM

## 2013-09-06 ENCOUNTER — Encounter: Payer: Self-pay | Admitting: Family Medicine

## 2013-09-17 ENCOUNTER — Ambulatory Visit (HOSPITAL_COMMUNITY): Payer: BC Managed Care – PPO | Attending: Cardiology | Admitting: Radiology

## 2013-09-17 ENCOUNTER — Encounter: Payer: Self-pay | Admitting: Cardiology

## 2013-09-17 VITALS — BP 170/97 | HR 55 | Ht 65.0 in | Wt 151.0 lb

## 2013-09-17 DIAGNOSIS — R079 Chest pain, unspecified: Secondary | ICD-10-CM | POA: Insufficient documentation

## 2013-09-17 MED ORDER — TECHNETIUM TC 99M SESTAMIBI GENERIC - CARDIOLITE
33.0000 | Freq: Once | INTRAVENOUS | Status: AC | PRN
  Administered 2013-09-17: 33 via INTRAVENOUS

## 2013-09-17 MED ORDER — TECHNETIUM TC 99M SESTAMIBI GENERIC - CARDIOLITE
10.8000 | Freq: Once | INTRAVENOUS | Status: AC | PRN
  Administered 2013-09-17: 11 via INTRAVENOUS

## 2013-09-17 NOTE — Progress Notes (Signed)
  [facility] NUCLEAR [facility] [address] [place], [place] [address] [phone]    Cardiology Nuclear Med Study  Ryan Ferguson is a 63 y.o. male     MRN : [id number]     DOB: 01-Sep-1950  Procedure Date: 09/17/2013  Nuclear Med Background Indication for Stress Test:  Evaluation for Ischemia History:  No known CAD, CKD Cardiac Risk Factors: History of Smoking, Hypertension, Lipids and NIDDM  Symptoms:  Chest Pain with Exertion (last date of chest discomfort was two days ago) and Palpitations   Nuclear Pre-Procedure Caffeine/Decaff Intake:  None NPO After: 8:00pm   Lungs:  clear O2 Sat: 95% on room air. IV 0.9% NS with Angio Cath:  20g  IV Site: R Hand  IV Started by:  Matilde Haymaker, RN  Chest Size (in):  38 Cup Size: n/a  Height: 5\' 5"  (1.651 m)  Weight:  151 lb (68.493 kg)  BMI:  Body mass index is 25.13 kg/(m^2). Tech Comments:  n/a    Nuclear Med Study 1 or 2 day study: 1 day  Stress Test Type:  Stress  Reading MD: n/a  Order Authorizing Provider:  Wallene Huh  Resting Radionuclide: Technetium 79m Sestamibi  Resting Radionuclide Dose: 11.0 mCi   Stress Radionuclide:  Technetium 66m Sestamibi  Stress Radionuclide Dose: 33.0 mCi           Stress Protocol Rest HR: 55 Stress HR: 136  Rest BP: 170/97 Stress BP: 219/90  Exercise Time (min): 11:00 METS: 13.4           Dose of Adenosine (mg):  n/a Dose of Lexiscan: n/a mg  Dose of Atropine (mg): n/a Dose of Dobutamine: n/a mcg/kg/min (at max HR)  Stress Test Technologist: Glade Lloyd, BS-ES  Nuclear Technologist:  Charlton Amor, CNMT     Rest Procedure:  Myocardial perfusion imaging was performed at rest 45 minutes following the intravenous administration of Technetium 37m Sestamibi. Rest ECG: NSR-LVH with NSSTW changes  Stress Procedure:  The patient exercised on the treadmill utilizing the Bruce Protocol for 11:00 minutes. The patient stopped due to fatigue and denied any chest pain.   Technetium 14m Sestamibi was injected at peak exercise and myocardial perfusion imaging was performed after a brief delay. Stress ECG: No significant ST segment change suggestive of ischemia. 79mm upsloping ST depression lateral leads. Brief paroxysmal atrial tachycardia  QPS Raw Data Images:  Normal; no motion artifact; normal heart/lung ratio. Stress Images:  Normal homogeneous uptake in all areas of the myocardium. Rest Images:  Normal homogeneous uptake in all areas of the myocardium. Subtraction (SDS):  No evidence of ischemia. Transient Ischemic Dilatation (Normal <1.22):  0.93 Lung/Heart Ratio (Normal <0.45):  0.33  Quantitative Gated Spect Images QGS EDV:  95 ml QGS ESV:  43 ml  Impression Exercise Capacity:  Excellent exercise capacity. BP Response:  Normal blood pressure response. Clinical Symptoms:  No significant symptoms noted. ECG Impression:  Insignificant upsloping ST segment depression. Comparison with Prior Nuclear Study: No previous nuclear study performed  Overall Impression:  Low risk stress nuclear study with no ischemia. .  LV Ejection Fraction: 55%.  LV Wall Motion:  NL LV Function; NL Wall Motion  Candee Furbish, MD

## 2013-10-31 ENCOUNTER — Telehealth: Payer: Self-pay

## 2013-10-31 NOTE — Telephone Encounter (Signed)
Call patient wants explaniation of the results of the tests.  [phone]  I tried to explain to her we need the medical release form completed in order to release the copies of her husbands records.   [phone]

## 2013-10-31 NOTE — Telephone Encounter (Signed)
Not exactly sure what this phone message is asking. Pt last had labs 06/2013. Left message on machine to call back

## 2013-11-03 NOTE — Telephone Encounter (Signed)
Pt wife is calling about pt's stress test. I don't see where we have a copy of the report. Advised wife that she should call cardio and have them fax [place] the report.

## 2013-11-16 DIAGNOSIS — B351 Tinea unguium: Secondary | ICD-10-CM

## 2015-05-12 ENCOUNTER — Ambulatory Visit (INDEPENDENT_AMBULATORY_CARE_PROVIDER_SITE_OTHER): Payer: BLUE CROSS/BLUE SHIELD

## 2015-05-12 ENCOUNTER — Encounter: Payer: Self-pay | Admitting: Podiatry

## 2015-05-12 ENCOUNTER — Ambulatory Visit (INDEPENDENT_AMBULATORY_CARE_PROVIDER_SITE_OTHER): Payer: BLUE CROSS/BLUE SHIELD | Admitting: Podiatry

## 2015-05-12 VITALS — BP 161/82 | HR 63 | Resp 16

## 2015-05-12 DIAGNOSIS — M722 Plantar fascial fibromatosis: Secondary | ICD-10-CM

## 2015-05-12 DIAGNOSIS — L600 Ingrowing nail: Secondary | ICD-10-CM | POA: Diagnosis not present

## 2015-05-12 DIAGNOSIS — B351 Tinea unguium: Secondary | ICD-10-CM

## 2015-05-12 DIAGNOSIS — M79671 Pain in right foot: Secondary | ICD-10-CM | POA: Diagnosis not present

## 2015-05-12 MED ORDER — TRIAMCINOLONE ACETONIDE 10 MG/ML IJ SUSP
10.0000 mg | Freq: Once | INTRAMUSCULAR | Status: AC
  Administered 2015-05-12: 10 mg

## 2015-05-12 MED ORDER — DICLOFENAC SODIUM 75 MG PO TBEC: 75.0000 mg | DELAYED_RELEASE_TABLET | Freq: Two times a day (BID) | ORAL | Status: DC

## 2015-05-12 MED ORDER — DICLOFENAC SODIUM 75 MG PO TBEC
75.0000 mg | DELAYED_RELEASE_TABLET | Freq: Two times a day (BID) | ORAL | Status: DC
Start: 2015-05-12 — End: 2015-05-12

## 2015-05-12 NOTE — Patient Instructions (Signed)

## 2015-05-12 NOTE — Progress Notes (Signed)
   Subjective:    Patient ID: Ryan Ferguson, male    DOB: 10-May-1951, 64 y.o.   MRN: [id number]  HPI Comments: "My heel hurts"  Patient presents with: Foot Pain: Plantar heel right - aching for 2-3 months, AM pain, no treatment.    Foot Pain      Review of Systems  All other systems reviewed and are negative.      Objective:   Physical Exam        Assessment & Plan:

## 2015-05-12 NOTE — Progress Notes (Signed)
Subjective:     Patient ID: Ryan Ferguson, male   DOB: [birth year], 65 y.o.   MRN: [id number]  HPI patient presents stating I'm having pain in my right heel 3 months duration and I do have trouble with ingrown toenail my left big toe with thickness and yellow numbness of all nailbeds   Review of Systems     Objective:   Physical Exam Neurovascular status found to be intact muscle strength adequate with discomfort of a exquisite nature plantar aspect right heel insertional point tendon the calcaneus and also incurvated lateral border left hallux with thickness of the nailbed itself    Assessment:     Acute plantar fasciitis right and ingrown toenail left    Plan:     H&P and both conditions discussed. Today I injected the right plantar fascia 3 Mill grams Kenalog 5 mill grams Xylocaine and applied fascial brace and gave instructions on physical therapy and placed on anti-inflammatory. Discussed correction of the left buttock and hold off on this current

## 2015-05-26 ENCOUNTER — Ambulatory Visit: Payer: BLUE CROSS/BLUE SHIELD | Admitting: Podiatry

## 2016-03-01 ENCOUNTER — Emergency Department (HOSPITAL_COMMUNITY)
Admission: EM | Admit: 2016-03-01 | Discharge: 2016-03-01 | Disposition: A | Discharge status: Home / Self Care | Payer: BLUE CROSS/BLUE SHIELD | Attending: Emergency Medicine | Admitting: Emergency Medicine

## 2016-03-01 ENCOUNTER — Encounter (HOSPITAL_COMMUNITY): Payer: Self-pay | Admitting: *Deleted

## 2016-03-01 DIAGNOSIS — F17200 Nicotine dependence, unspecified, uncomplicated: Secondary | ICD-10-CM | POA: Insufficient documentation

## 2016-03-01 DIAGNOSIS — T6591XA Toxic effect of unspecified substance, accidental (unintentional), initial encounter: Secondary | ICD-10-CM

## 2016-03-01 DIAGNOSIS — E039 Hypothyroidism, unspecified: Secondary | ICD-10-CM | POA: Insufficient documentation

## 2016-03-01 DIAGNOSIS — N189 Chronic kidney disease, unspecified: Secondary | ICD-10-CM | POA: Insufficient documentation

## 2016-03-01 DIAGNOSIS — Z7982 Long term (current) use of aspirin: Secondary | ICD-10-CM | POA: Diagnosis not present

## 2016-03-01 DIAGNOSIS — T781XXA Other adverse food reactions, not elsewhere classified, initial encounter: Secondary | ICD-10-CM | POA: Insufficient documentation

## 2016-03-01 DIAGNOSIS — I1310 Hypertensive heart and chronic kidney disease without heart failure, with stage 1 through stage 4 chronic kidney disease, or unspecified chronic kidney disease: Secondary | ICD-10-CM | POA: Diagnosis not present

## 2016-03-01 DIAGNOSIS — Z79899 Other long term (current) drug therapy: Secondary | ICD-10-CM | POA: Insufficient documentation

## 2016-03-01 DIAGNOSIS — E1122 Type 2 diabetes mellitus with diabetic chronic kidney disease: Secondary | ICD-10-CM | POA: Insufficient documentation

## 2016-03-01 DIAGNOSIS — Z7984 Long term (current) use of oral hypoglycemic drugs: Secondary | ICD-10-CM | POA: Insufficient documentation

## 2016-03-01 DIAGNOSIS — R112 Nausea with vomiting, unspecified: Secondary | ICD-10-CM | POA: Diagnosis present

## 2016-03-01 LAB — COMPREHENSIVE METABOLIC PANEL
ALBUMIN: 5.3 g/dL — AB (ref 3.5–5.0)
ALT: 36 U/L (ref 17–63)
ANION GAP: 14 (ref 5–15)
AST: 25 U/L (ref 15–41)
Alkaline Phosphatase: 73 U/L (ref 38–126)
BUN: 25 mg/dL — AB (ref 6–20)
CHLORIDE: 102 mmol/L (ref 101–111)
CO2: 22 mmol/L (ref 22–32)
Calcium: 10.5 mg/dL — ABNORMAL HIGH (ref 8.9–10.3)
Creatinine, Ser: 1.17 mg/dL (ref 0.61–1.24)
GFR calc Af Amer: >60 mL/min (ref >60)
Glucose, Bld: 149 mg/dL — ABNORMAL HIGH (ref 65–99)
POTASSIUM: 3.6 mmol/L (ref 3.5–5.1)
Sodium: 138 mmol/L (ref 135–145)
Total Bilirubin: 1 mg/dL (ref 0.3–1.2)
Total Protein: 8.9 g/dL — ABNORMAL HIGH (ref 6.5–8.1)

## 2016-03-01 LAB — CBC WITH DIFFERENTIAL/PLATELET
BASOS ABS: 0 10*3/uL (ref 0.0–0.1)
Basophils Relative: 0 %
EOS PCT: 0 %
Eosinophils Absolute: 0 10*3/uL (ref 0.0–0.7)
HEMATOCRIT: 51.9 % (ref 39.0–52.0)
HEMOGLOBIN: 17.5 g/dL — AB (ref 13.0–17.0)
LYMPHS ABS: 0.3 10*3/uL — AB (ref 0.7–4.0)
LYMPHS PCT: 4 %
MCH: 31.3 pg (ref 26.0–34.0)
MCHC: 33.7 g/dL (ref 30.0–36.0)
MCV: 92.8 fL (ref 78.0–100.0)
Monocytes Absolute: 0.3 10*3/uL (ref 0.1–1.0)
Monocytes Relative: 3 %
NEUTROS ABS: 8.1 10*3/uL — AB (ref 1.7–7.7)
Neutrophils Relative %: 93 %
Platelets: 138 10*3/uL — ABNORMAL LOW (ref 150–400)
RBC: 5.59 MIL/uL (ref 4.22–5.81)
RDW: 12.8 % (ref 11.5–15.5)
WBC: 8.6 10*3/uL (ref 4.0–10.5)

## 2016-03-01 LAB — LIPASE, BLOOD: Lipase: 29 U/L (ref 11–51)

## 2016-03-01 MED ORDER — METOCLOPRAMIDE HCL 5 MG/ML IJ SOLN
10.0000 mg | Freq: Once | INTRAMUSCULAR | Status: AC
  Administered 2016-03-01: 10 mg via INTRAVENOUS
  Filled 2016-03-01: qty 2

## 2016-03-01 MED ORDER — ONDANSETRON 4 MG PO TBDP
4.0000 mg | ORAL_TABLET | Freq: Once | ORAL | Status: AC | PRN
  Administered 2016-03-01: 4 mg via ORAL
  Filled 2016-03-01: qty 1

## 2016-03-01 MED ORDER — SODIUM CHLORIDE 0.9 % IV BOLUS (SEPSIS)
2000.0000 mL | Freq: Once | INTRAVENOUS | Status: AC
  Administered 2016-03-01: 2000 mL via INTRAVENOUS

## 2016-03-01 NOTE — ED Notes (Signed)
I attempted to collect labs twice and was unsuccessful 

## 2016-03-01 NOTE — ED Triage Notes (Signed)
Pt complains of n/v/abdominal pain/diaphoresis since he ate a mushroom from his backyard. Pt ate the mushroom at ~11AM, states he began feeling nauseas at ~1PM. Pt states he fried the mushroom. Pt denies diarrhea

## 2016-03-01 NOTE — Discharge Instructions (Signed)
Return if concern for any reason or contact your primary care physician

## 2016-03-01 NOTE — ED Provider Notes (Signed)
[facility] DEPT Provider Note   CSN: [id number] Arrival date & time: 03/01/16  1359     History   Chief Complaint Chief Complaint  Patient presents with  . Ingestion  . Emesis    HPI Ryan Ferguson is a 65 y.o. male.  HPI patient ate mushrooms from his yard 11:30 AM today since the event he is vomited multiple times and complains of mild epigastric discomfort. No other associated symptoms. No treatment prior to coming here. Nothing makes symptoms better or worse.  Past Medical History:  Diagnosis Date  . Chronic kidney disease   . Diabetes mellitus without complication (HCC)    does not check cbg daily  . Hypertension   . Hyperthyroidism   . Renal calculi     Patient Active Problem List   Diagnosis Date Noted  . Hypertension   . Chronic kidney disease   . Hyperthyroidism   . Diabetes mellitus without complication ([place])   . Renal calculi   . Type II or unspecified type diabetes mellitus without mention of complication, not stated as uncontrolled 11/27/2012    Past Surgical History:  Procedure Laterality Date  . APPENDECTOMY    . COLON SURGERY    . CYSTOSCOPY    . LITHOTRIPSY     twice  . LITHOTRIPSY         Home Medications    Prior to Admission medications   Medication Sig Start Date End Date Taking? Authorizing Provider  aspirin EC 81 MG tablet Take 81 mg by mouth.   Yes Historical Provider, MD  atorvastatin (LIPITOR) 20 MG tablet Take 20 mg by mouth every morning.    Yes Historical Provider, MD  cholecalciferol (VITAMIN D) 1000 UNITS tablet Take 1,000 Units by mouth daily.   Yes Historical Provider, MD  levothyroxine (SYNTHROID, LEVOTHROID) 100 MCG tablet Take 100 mcg by mouth every morning.    Yes Historical Provider, MD  lisinopril (PRINIVIL,ZESTRIL) 40 MG tablet Take 40 mg by mouth daily.   Yes Historical Provider, MD  metFORMIN (GLUCOPHAGE) 850 MG tablet Take 850 mg by mouth 2 (two) times daily with a meal.   Yes Historical Provider, MD    diclofenac (VOLTAREN) 75 MG EC tablet Take 1 tablet (75 mg total) by mouth 2 (two) times daily. Patient not taking: Reported on 03/01/2016 05/12/15   Wallene Huh, DPM  ONE TOUCH ULTRA TEST test strip USE TEST STRIPS TO TEST BLOOD SUGAR ONCE DAILY 03/07/15   Historical Provider, MD  zoster vaccine live, PF, (ZOSTAVAX) 96295 UNT/0.65ML injection Inject 19,400 Units into the skin once. 07/09/13   Rise Mu, PA-C    Family History Family History  Problem Relation Age of Onset  . Cancer Father     Pancreatic  . High Cholesterol Father   . Diabetes Maternal Grandmother   . Cancer Maternal Grandfather     Lung  . Heart disease Maternal Grandfather   . Heart disease Paternal Grandmother     Social History Social History  Substance Use Topics  . Smoking status: Current Every Day Smoker    Packs/day: 1.00  . Smokeless tobacco: Never Used  . Alcohol use No   Rare Alcohol. No illicit drug use  Allergies   Lisinopril   Review of Systems Review of Systems  Constitutional: Negative.   HENT: Negative.   Respiratory: Negative.   Cardiovascular: Positive for chest pain.       Syncope  Gastrointestinal: Positive for abdominal pain, nausea and vomiting.  Musculoskeletal:  Negative.   Skin: Negative.   Allergic/Immunologic: Positive for immunocompromised state.       Diabetic  Neurological: Negative.   Psychiatric/Behavioral: Negative.   All other systems reviewed and are negative.    Physical Exam Updated Vital Signs BP 132/72 (BP Location: Right Arm)   Pulse (!) 56   Temp 98.3 F (36.8 C) (Oral)   Resp 18   SpO2 100%   Physical Exam  Constitutional: He appears well-developed and well-nourished.  Appears mildly uncomfortable  HENT:  Head: Normocephalic and atraumatic.  Eyes: Conjunctivae are normal. Pupils are equal, round, and reactive to light.  Neck: Neck supple. No tracheal deviation present. No thyromegaly present.  Cardiovascular: Normal rate and regular  rhythm.   No murmur heard. Pulmonary/Chest: Effort normal and breath sounds normal.  Abdominal: Soft. Bowel sounds are normal. He exhibits no distension. There is no tenderness.  Musculoskeletal: Normal range of motion. He exhibits no edema or tenderness.  Neurological: He is alert. Coordination normal.  Skin: Skin is warm and dry. No rash noted.  Psychiatric: He has a normal mood and affect.  Nursing note and vitals reviewed.    ED Treatments / Results  Labs (all labs ordered are listed, but only abnormal results are displayed) Labs Reviewed  LIPASE, BLOOD  COMPREHENSIVE METABOLIC PANEL  CBC  URINALYSIS, ROUTINE W REFLEX MICROSCOPIC (NOT AT [facility])  CBC WITH DIFFERENTIAL/PLATELET  LIPASE, BLOOD    EKG  EKG Interpretation None       Radiology No results found.  Procedures Procedures (including critical care time)  Medications Ordered in ED Medications  sodium chloride 0.9 % bolus 2,000 mL (not administered)  ondansetron (ZOFRAN-ODT) disintegrating tablet 4 mg (4 mg Oral Given 03/01/16 1423)    Results for orders placed or performed during the hospital encounter of 03/01/16  Comprehensive metabolic panel  Result Value Ref Range   Sodium 138 135 - 145 mmol/L   Potassium 3.6 3.5 - 5.1 mmol/L   Chloride 102 101 - 111 mmol/L   CO2 22 22 - 32 mmol/L   Glucose, Bld 149 (H) 65 - 99 mg/dL   BUN 25 (H) 6 - 20 mg/dL   Creatinine, Ser 1.17 0.61 - 1.24 mg/dL   Calcium 10.5 (H) 8.9 - 10.3 mg/dL   Total Protein 8.9 (H) 6.5 - 8.1 g/dL   Albumin 5.3 (H) 3.5 - 5.0 g/dL   AST 25 15 - 41 U/L   ALT 36 17 - 63 U/L   Alkaline Phosphatase 73 38 - 126 U/L   Total Bilirubin 1.0 0.3 - 1.2 mg/dL   GFR calc non Af Amer >60 >60 mL/min   GFR calc Af Amer >60 >60 mL/min   Anion gap 14 5 - 15  CBC with Differential/Platelet  Result Value Ref Range   WBC 8.6 4.0 - 10.5 K/uL   RBC 5.59 4.22 - 5.81 MIL/uL   Hemoglobin 17.5 (H) 13.0 - 17.0 g/dL   HCT 51.9 39.0 - 52.0 %   MCV 92.8 78.0 -  100.0 fL   MCH 31.3 26.0 - 34.0 pg   MCHC 33.7 30.0 - 36.0 g/dL   RDW 12.8 11.5 - 15.5 %   Platelets 138 (L) 150 - 400 K/uL   Neutrophils Relative % 93 %   Neutro Abs 8.1 (H) 1.7 - 7.7 K/uL   Lymphocytes Relative 4 %   Lymphs Abs 0.3 (L) 0.7 - 4.0 K/uL   Monocytes Relative 3 %   Monocytes Absolute 0.3 0.1 - 1.0  K/uL   Eosinophils Relative 0 %   Eosinophils Absolute 0.0 0.0 - 0.7 K/uL   Basophils Relative 0 %   Basophils Absolute 0.0 0.0 - 0.1 K/uL  Lipase, blood  Result Value Ref Range   Lipase 29 11 - 51 U/L   No results found. Initial Impression / Assessment and Plan / ED Course  I have reviewed the triage vital signs and the nursing notes.  Pertinent labs & imaging results that were available during my care of the patient were reviewed by me and considered in my medical decision making (see chart for details).  Clinical Course    Poison control contacted. Suggest symptomatic care. Observe patient 6 hours postingestion or until asymptomatic 6:40 PM patient asymptomatic able to drink water without difficulty after treatment with intravenous hydration and IV Reglan. Feels ready to go home. Final Clinical Impressions(s) / ED Diagnoses  Plan home observation Diagnosis nontoxic ingestion Final diagnoses:  None    New Prescriptions New Prescriptions   No medications on file     Orlie Dakin, MD 03/01/16 1845

## 2016-04-05 DIAGNOSIS — M722 Plantar fascial fibromatosis: Secondary | ICD-10-CM

## 2017-03-25 ENCOUNTER — Encounter (HOSPITAL_COMMUNITY): Payer: Self-pay | Admitting: Emergency Medicine

## 2017-03-25 ENCOUNTER — Emergency Department (HOSPITAL_COMMUNITY)
Admission: EM | Admit: 2017-03-25 | Discharge: 2017-03-25 | Disposition: A | Discharge status: Home / Self Care | Payer: BLUE CROSS/BLUE SHIELD | Attending: Emergency Medicine | Admitting: Emergency Medicine

## 2017-03-25 DIAGNOSIS — N189 Chronic kidney disease, unspecified: Secondary | ICD-10-CM | POA: Diagnosis not present

## 2017-03-25 DIAGNOSIS — R55 Syncope and collapse: Secondary | ICD-10-CM | POA: Diagnosis present

## 2017-03-25 DIAGNOSIS — E1122 Type 2 diabetes mellitus with diabetic chronic kidney disease: Secondary | ICD-10-CM | POA: Insufficient documentation

## 2017-03-25 DIAGNOSIS — F17200 Nicotine dependence, unspecified, uncomplicated: Secondary | ICD-10-CM | POA: Insufficient documentation

## 2017-03-25 DIAGNOSIS — I129 Hypertensive chronic kidney disease with stage 1 through stage 4 chronic kidney disease, or unspecified chronic kidney disease: Secondary | ICD-10-CM | POA: Insufficient documentation

## 2017-03-25 LAB — URINALYSIS, ROUTINE W REFLEX MICROSCOPIC
BILIRUBIN URINE: NEGATIVE
GLUCOSE, UA: NEGATIVE mg/dL
Hgb urine dipstick: NEGATIVE
KETONES UR: NEGATIVE mg/dL
LEUKOCYTES UA: NEGATIVE
Nitrite: NEGATIVE
PH: 5 (ref 5.0–8.0)
Protein, ur: NEGATIVE mg/dL
Specific Gravity, Urine: 1.016 (ref 1.005–1.030)

## 2017-03-25 LAB — BASIC METABOLIC PANEL
Anion gap: 18 — ABNORMAL HIGH (ref 5–15)
BUN: 39 mg/dL — AB (ref 6–20)
CHLORIDE: 97 mmol/L — AB (ref 101–111)
CO2: 22 mmol/L (ref 22–32)
Calcium: 10.1 mg/dL (ref 8.9–10.3)
Creatinine, Ser: 1.16 mg/dL (ref 0.61–1.24)
GFR calc Af Amer: >60 mL/min (ref >60)
GFR calc non Af Amer: >60 mL/min (ref >60)
GLUCOSE: 130 mg/dL — AB (ref 65–99)
POTASSIUM: 3.9 mmol/L (ref 3.5–5.1)
Sodium: 137 mmol/L (ref 135–145)

## 2017-03-25 LAB — CBC
HEMATOCRIT: 40.5 % (ref 39.0–52.0)
Hemoglobin: 13.8 g/dL (ref 13.0–17.0)
MCH: 31.1 pg (ref 26.0–34.0)
MCHC: 34.1 g/dL (ref 30.0–36.0)
MCV: 91.2 fL (ref 78.0–100.0)
Platelets: 232 10*3/uL (ref 150–400)
RBC: 4.44 MIL/uL (ref 4.22–5.81)
RDW: 12.7 % (ref 11.5–15.5)
WBC: 7 10*3/uL (ref 4.0–10.5)

## 2017-03-25 LAB — CBG MONITORING, ED: Glucose-Capillary: 162 mg/dL — ABNORMAL HIGH (ref 65–99)

## 2017-03-25 MED ORDER — ONDANSETRON HCL 4 MG/2ML IJ SOLN
4.0000 mg | Freq: Once | INTRAMUSCULAR | Status: AC
  Administered 2017-03-25: 4 mg via INTRAVENOUS
  Filled 2017-03-25: qty 2

## 2017-03-25 MED ORDER — SODIUM CHLORIDE 0.9 % IV BOLUS (SEPSIS)
1000.0000 mL | Freq: Once | INTRAVENOUS | Status: AC
  Administered 2017-03-25: 1000 mL via INTRAVENOUS

## 2017-03-25 MED ORDER — LISINOPRIL-HYDROCHLOROTHIAZIDE 20-12.5 MG PO TABS: 1.0000 | ORAL_TABLET | Freq: Every evening | ORAL | 0 refills | Status: DC

## 2017-03-25 NOTE — ED Triage Notes (Signed)
Pt had a unwitnessed syncopal episode this am. Pt thinks he was out for 20 minutes. Was not doing any strenuous activity before syncopal episode. Pt has recently been put on HTN medication, and thinks his syncopal episode is related to this as a similar thing happened last time he was put on this medication.

## 2017-03-25 NOTE — ED Notes (Signed)
Bed: WA03 Expected date:  Expected time:  Means of arrival:  Comments: 

## 2017-03-25 NOTE — ED Provider Notes (Signed)
Emergency Department Provider Note   I have reviewed the triage vital signs and the nursing notes.   HISTORY  Chief Complaint Loss of Consciousness   HPI Ryan Ferguson is a 66 y.o. male with a reported history of chronic kidney disease, diabetes and hypertension he takes and HCTZ/lisinopril combination pill. Patient states he woke up this morning was walking to the bathroom and had a syncopal episode of unknown duration. After he woke up he got his wife out of bed he took his blood pressure and at that time after that were just 87/53. He had some nausea at that time but did not have any pain anywhere. Denies any palpitations. No history of syncope. States she's been to a nephrologist and urologist recently and both a everything looks okay. No recent changes medications. States he's been eating and drinking normally with increased water intake recently. No fevers or other illnesses. No associated modifying symptoms.   Past Medical History:  Diagnosis Date  . Chronic kidney disease   . Diabetes mellitus without complication (HCC)    does not check cbg daily  . Hypertension   . Hyperthyroidism   . Renal calculi     Patient Active Problem List   Diagnosis Date Noted  . Hypertension   . Chronic kidney disease   . Hyperthyroidism   . Diabetes mellitus without complication ([place])   . Renal calculi   . Type II or unspecified type diabetes mellitus without mention of complication, not stated as uncontrolled 11/27/2012    Past Surgical History:  Procedure Laterality Date  . APPENDECTOMY    . COLON SURGERY    . CYSTOSCOPY    . LITHOTRIPSY     twice  . LITHOTRIPSY      Current Outpatient Rx  . Order #: [id number] Class: Historical Med  . Order #: [id number] Class: Historical Med  . Order #: [id number] Class: Historical Med  . Order #: [id number] Class: Historical Med  . Order #: [id number] Class: Historical Med  . Order #: [id number] Class: Historical Med  . Order #: [id number] Class:  Print  . Order #: [id number] Class: Historical Med  . Order #: [id number] Class: Historical Med    Allergies Lisinopril  Family History  Problem Relation Age of Onset  . Cancer Father        Pancreatic  . High Cholesterol Father   . Diabetes Maternal Grandmother   . Cancer Maternal Grandfather        Lung  . Heart disease Maternal Grandfather   . Heart disease Paternal Grandmother     Social History Social History  Substance Use Topics  . Smoking status: Current Every Day Smoker    Packs/day: 1.00  . Smokeless tobacco: Never Used  . Alcohol use No    Review of Systems  All other systems negative except as documented in the HPI. All pertinent positives and negatives as reviewed in the HPI. ____________________________________________   PHYSICAL EXAM:  VITAL SIGNS: ED Triage Vitals [03/25/17 1326]  Enc Vitals Group     BP 108/63     Pulse Rate 71     Resp 16     Temp 97.8 F (36.6 C)     Temp Source Oral     SpO2 98 %    Constitutional: Alert and oriented. Well appearing and in no acute distress. Eyes: Conjunctivae are normal. PERRL. EOMI. Head: Atraumatic. Nose: No congestion/rhinnorhea. Mouth/Throat: Mucous membranes are moist.  Oropharynx non-erythematous. Neck: No stridor.  No meningeal signs.   Cardiovascular: Normal rate, regular  rhythm. Good peripheral circulation. Grossly normal heart sounds.   Respiratory: Normal respiratory effort.  No retractions. Lungs CTAB. Gastrointestinal: Soft and nontender. No distention.  Musculoskeletal: No lower extremity tenderness nor edema. No gross deformities of extremities. Neurologic:  Normal speech and language. No gross focal neurologic deficits are appreciated.  Skin:  Skin is warm, dry and intact. No rash noted.  ____________________________________________   LABS (all labs ordered are listed, but only abnormal results are displayed)  Labs Reviewed  BASIC METABOLIC PANEL - Abnormal; Notable for the  following:       Result Value   Chloride 97 (*)    Glucose, Bld 130 (*)    BUN 39 (*)    Anion gap 18 (*)    All other components within normal limits  CBG MONITORING, ED - Abnormal; Notable for the following:    Glucose-Capillary 162 (*)    All other components within normal limits  CBC  URINALYSIS, ROUTINE W REFLEX MICROSCOPIC   ____________________________________________  EKG   EKG Interpretation  Date/Time:  Friday March 25 2017 13:31:39 EDT Ventricular Rate:  70 PR Interval:    QRS Duration: 92 QT Interval:  389 QTC Calculation: 420 R Axis:   67 Text Interpretation:  Sinus rhythm Borderline T wave abnormalities improved ST changes since september 2017 Confirmed by Merrily Pew [phone]) on 03/25/2017 4:34:25 PM       ____________________________________________  RADIOLOGY  No results found.  ____________________________________________   PROCEDURES  Procedure(s) performed:   Procedures   ____________________________________________   INITIAL IMPRESSION / ASSESSMENT AND PLAN / ED COURSE  Pertinent labs & imaging results that were available during my care of the patient were reviewed by me and considered in my medical decision making (see chart for details).  Low risk for cardiac or neurogenic causes of syncope. Likely orthostatic in nature. Will check orthostatic vitals. Zofran/fluids as well.   Significantly improved symptoms here. Ambulates without difficulty. Will continue follow up with PCP, decrease BP meds for now.   ____________________________________________  FINAL CLINICAL IMPRESSION(S) / ED DIAGNOSES  Final diagnoses:  Syncope, unspecified syncope type    MEDICATIONS GIVEN DURING THIS VISIT:  Medications  sodium chloride 0.9 % bolus 1,000 mL (0 mLs Intravenous Stopped 03/25/17 1926)  ondansetron (ZOFRAN) injection 4 mg (4 mg Intravenous Given 03/25/17 1719)     NEW OUTPATIENT MEDICATIONS STARTED DURING THIS VISIT:  Discharge  Medication List as of 03/25/2017  7:19 PM      Note:  This document was prepared using Dragon voice recognition software and may include unintentional dictation errors.   Merrily Pew, MD 03/25/17 2256

## 2017-03-25 NOTE — ED Notes (Signed)
Pt ambulated in hall with no problems.

## 2017-04-29 ENCOUNTER — Encounter: Payer: BLUE CROSS/BLUE SHIELD | Attending: Internal Medicine | Admitting: Registered"

## 2017-04-29 ENCOUNTER — Encounter: Payer: Self-pay | Admitting: Registered"

## 2017-04-29 DIAGNOSIS — E119 Type 2 diabetes mellitus without complications: Secondary | ICD-10-CM

## 2017-04-29 DIAGNOSIS — Z713 Dietary counseling and surveillance: Secondary | ICD-10-CM | POA: Insufficient documentation

## 2017-04-29 NOTE — Patient Instructions (Signed)
Plan:  Aim for 4-5 Carb Choices per meal (60-75 grams) Aim for 0-2 Carbs per snack if hungry  Include protein with your meals and snacks Consider reading food labels for Total Carbohydrate  Continue your activity level daily as tolerated Consider checking BG at alternate times per day as directed by MD  Continue taking medication as directed by MD

## 2017-04-29 NOTE — Progress Notes (Signed)
Diabetes Self-Management Education  Visit Type: First/Initial  Appt. Start Time: 0835 Appt. End Time: 7408  04/29/2017  Mr. Ryan Ferguson, identified by name and date of birth, is a 66 y.o. male with a diagnosis of Diabetes: Type 2.   ASSESSMENT Pt states he is here because his morning (fasting) blood sugar has jumped from an average of 120 to 150 even when he eats only vegetables at night. Pt states he wants to know what an acceptable number is. RD did not have time this visit to explain details of the purpose and meaning of the values when checking BG, but encouraged pt to return to learn more about the lab values that are monitored in diabetes.  Pt states he has been eating very low sugar diet and misses eating things like spaghetti.   Sleep: Pt states he goes to bed 10 or 11 pm and gets up 4:30 am = 5-6 hours. Pt states he thinks this is enough sleep and is not tired during the day.  Diabetes Self-Management Education - 04/29/17 0851      Visit Information   Visit Type  First/Initial      Initial Visit   Diabetes Type  Type 2    Are you currently following a meal plan?  Yes    What type of meal plan do you follow?  low sugar    Are you taking your medications as prescribed?  Yes metformin 850 mg, BID   metformin 850 mg, BID   Date Diagnosed  2008      Psychosocial Assessment   Patient Belief/Attitude about Diabetes  Motivated to manage diabetes    What is the last grade level you completed in school?  --      Complications   Last HgB A1C per patient/outside source  6.6 % per referral labs 07/2016   per referral labs 07/2016   How often do you check your blood sugar?  1-2 times/day    Fasting Blood glucose range (mg/dL)  70-129 150 average was 125   150 average was 125   Number of hyperglycemic episodes per week  0 a few times a month   a few times a month   Have you had a dilated eye exam in the past 12 months?  Yes    Have you had a dental exam in the past 12 months?   Yes    Are you checking your feet?  No      Dietary Intake   Breakfast  oats, dry fruit, coffee and milk 5:30 am   5:30 am   Snack (morning)  cottage type cheese, cinnamon, diet green tea    Lunch  soup, cabbage, potato sometimes meat OR steak, vegetable, sometimes potato or pasta    Snack (afternoon)  cheese oR meat or NOne    Dinner  vegetable OR pancakes OR grain (lower carb), apple OR  after 8 pm   after 8 pm   Snack (evening)  none    Beverage(s)  2 x week beer, water, coffee, diet green tea      Exercise   Exercise Type  Light (walking / raking leaves)    How many days per week to you exercise?  7    How many minutes per day do you exercise?  20    Total minutes per week of exercise  140      Patient Education   Previous Diabetes Education  No    Nutrition management  Role of diet in the treatment of diabetes and the relationship between the three main macronutrients and blood glucose level;Food label reading, portion sizes and measuring food.;Carbohydrate counting    Physical activity and exercise   Role of exercise on diabetes management, blood pressure control and cardiac health.    Monitoring  Purpose and frequency of SMBG.      Individualized Goals (developed by patient)   Nutrition  General guidelines for healthy choices and portions discussed      Outcomes   Expected Outcomes  Demonstrated interest in learning. Expect positive outcomes    Future DMSE  PRN    Program Status  Completed     Individualized Plan for Diabetes Self-Management Training:   Learning Objective:  Patient will have a greater understanding of diabetes self-management. Patient education plan is to attend individual and/or group sessions per assessed needs and concerns.  Patient Instructions  Plan:  Aim for 4-5 Carb Choices per meal (60-75 grams) Aim for 0-2 Carbs per snack if hungry  Include protein with your meals and snacks Consider reading food labels for Total Carbohydrate  Continue  your activity level daily as tolerated Consider checking BG at alternate times per day as directed by MD  Continue taking medication as directed by MD  Expected Outcomes:  Demonstrated interest in learning. Expect positive outcomes  Education material provided: My Plate, Snack sheet and Carbohydrate counting sheet, Carb Counting and Meal Planning [employer])  If problems or questions, patient to contact team via:  Phone  Future DSME appointment: PRN

## 2018-07-20 DIAGNOSIS — E039 Hypothyroidism, unspecified: Secondary | ICD-10-CM | POA: Diagnosis not present

## 2018-07-20 DIAGNOSIS — Z5181 Encounter for therapeutic drug level monitoring: Secondary | ICD-10-CM | POA: Diagnosis not present

## 2018-07-20 DIAGNOSIS — M791 Myalgia, unspecified site: Secondary | ICD-10-CM | POA: Diagnosis not present

## 2018-07-20 DIAGNOSIS — E1165 Type 2 diabetes mellitus with hyperglycemia: Secondary | ICD-10-CM | POA: Diagnosis not present

## 2018-07-20 DIAGNOSIS — I10 Essential (primary) hypertension: Secondary | ICD-10-CM | POA: Diagnosis not present

## 2018-08-03 ENCOUNTER — Other Ambulatory Visit: Payer: Self-pay

## 2018-08-03 NOTE — Patient Outreach (Addendum)
  [facility]) Care Management Chronic Special Needs Program   08/03/2018  Name: Ryan Ferguson, DOB: 12/28/1950  MRN: [id number]  The client was discussed in 07/28/2018 interdisciplinary care team meeting.The client's individualized care plan was developed based on completed Health Risk Assessment    The following issues were discussed:  Client's needs, Key risk triggers/risk stratification and Care Plan  Participants present  Mahlon Gammon, MSN, RN, CCM, CNS    Thea Silversmith, MSN, RN, CCM   Elpidio Thielen RN,BSN,CCM, CDE   Recommendations:  Send Advanced Directive packet, education HTN  Plan:  Plan to send copy of individualized care plan to client Plan to send individualized care plan to provider. Chronic Care Management Coordinator will outreach in 2-4 months  Follow-up:  Chronic Care Management Coordinator will outreach in 2-4 months  Peter Garter RN, [facility], [place] Management Coordinator [place] Management [phone]

## 2018-09-01 DIAGNOSIS — E119 Type 2 diabetes mellitus without complications: Secondary | ICD-10-CM | POA: Diagnosis not present

## 2018-09-01 DIAGNOSIS — Z79899 Other long term (current) drug therapy: Secondary | ICD-10-CM | POA: Diagnosis not present

## 2018-09-01 DIAGNOSIS — M791 Myalgia, unspecified site: Secondary | ICD-10-CM | POA: Diagnosis not present

## 2018-09-01 DIAGNOSIS — R350 Frequency of micturition: Secondary | ICD-10-CM | POA: Diagnosis not present

## 2018-09-01 DIAGNOSIS — E039 Hypothyroidism, unspecified: Secondary | ICD-10-CM | POA: Diagnosis not present

## 2018-09-01 DIAGNOSIS — Z125 Encounter for screening for malignant neoplasm of prostate: Secondary | ICD-10-CM | POA: Diagnosis not present

## 2018-09-01 DIAGNOSIS — M6281 Muscle weakness (generalized): Secondary | ICD-10-CM | POA: Diagnosis not present

## 2018-11-02 ENCOUNTER — Other Ambulatory Visit: Payer: Self-pay

## 2018-11-02 NOTE — Patient Outreach (Signed)
  [facility]) Care Management Chronic Special Needs Program  11/02/2018  Name: Ryan Ferguson DOB: 1950/07/24  MRN: [id number]  Mr. Ryan Ferguson is enrolled in a chronic special needs plan for Diabetes.   HIPAA compliant message left left with family member. Plan for 2nd outreach call in one week Chronic care management coordinator will attempt outreach in one week.   Peter Garter RN, Jackquline Denmark, CDE Chronic Care Management Coordinator [facility] [phone]

## 2018-11-10 ENCOUNTER — Other Ambulatory Visit: Payer: Self-pay

## 2018-11-10 NOTE — Patient Outreach (Signed)
  [facility]) Care Management Chronic Special Needs Program  11/10/2018  Name: Ryan Ferguson DOB: [birth year]  MRN: [id number]  Mr. Ryan Ferguson is enrolled in a chronic special needs plan for Diabetes.  HIPAA compliant message left with family member. 2nd attempt Plan for 3rd outreach call in one week Chronic care management coordinator will attempt outreach in one week.   Peter Garter RN, Jackquline Denmark, CDE Chronic Care Management Coordinator [place] [facility] [phone]

## 2018-11-17 ENCOUNTER — Other Ambulatory Visit: Payer: Self-pay

## 2018-11-17 NOTE — Patient Outreach (Signed)
  [facility]) Care Management Chronic Special Needs Program  11/17/2018  Name: Ryan Ferguson DOB: 1950-10-28  MRN: [id number]  Mr. Ryan Ferguson is enrolled in a chronic special needs plan for Diabetes.  HIPAA compliant message left with family member.  3rd attempt Plan to review and update  Individualized Care Plan in 3-4 days if call not returned Chronic care management coordinator will attempt outreach in 6 Months.   Peter Garter RN, Jackquline Denmark, CDE Chronic Care Management Coordinator [place] [facility] [phone]

## 2018-11-29 ENCOUNTER — Other Ambulatory Visit: Payer: Self-pay

## 2018-11-29 NOTE — Patient Outreach (Signed)
  [facility]) Care Management Chronic Special Needs Program  11/29/2018  Name: Ryan Ferguson DOB: 10/14/1950  MRN: [id number]  Mr. [name] is enrolled in a chronic special needs plan for Diabetes. Reviewed and updated care plan. Client has not responded to 3 outreach attempts by [facility] to update individualized care plan  The client's individualized care plan was updated based on available data.    Goals Addressed            This Visit's Progress   .  Acknowledge receipt of Advanced Directive package   On track   . Advanced Care Planning complete by next 9 months   On track   . Client understands the importance of follow-up with providers by attending scheduled visits   On track   . Client will use Assistive Devices as needed and verbalize understanding of device use   On track   . Client will verbalize knowledge of self management of Hypertension as evidences by BP reading of 140/90 or less; or as defined by provider   On track   . HEMOGLOBIN A1C < 7.0       Diabetes self management actions:  Glucose monitoring per provider recommendations  Eat Healthy  Check feet daily  Visit provider every 3-6 months as directed  Hbg A1C level every 3-6 months.  Eye Exam yearly    . Maintain timely refills of diabetic medication as prescribed within the year .   On track   . COMPLETED: Obtain annual  Lipid Profile, LDL-C       Completed 09/01/18     . Obtain Annual Eye (retinal)  Exam    On track   . Obtain Annual Foot Exam   On track   . COMPLETED: Obtain annual screen for micro albuminuria (urine) , nephropathy (kidney problems)       Completed 09/01/18    . Obtain Hemoglobin A1C at least 2 times per year   On track    Checked 09/01/18     . Visit Primary Care Provider or Endocrinologist at least 2 times per year    On track    09/01/18 endocrinologist      Client is not meeting diabetes self management goal of hemoglobin A1C of <7% with last reading of  7% Plan:  Send unsuccessful outreach letter with a copy of their individualized care plan and Send individual care plan to provider  Chronic care management coordinator will outreach in:  6 Months      Peter Garter RN, [facility], [place] Management Coordinator [place] Management [phone]

## 2018-12-19 ENCOUNTER — Other Ambulatory Visit: Payer: Self-pay

## 2018-12-19 DIAGNOSIS — E785 Hyperlipidemia, unspecified: Secondary | ICD-10-CM

## 2018-12-19 NOTE — Progress Notes (Signed)
Spoke with CT, the patient needed a calcium score, Dr. Johnsie Cancel to sign since the patient does not have a [facility] cardiologist.

## 2018-12-19 NOTE — Telephone Encounter (Signed)
error 

## 2019-01-13 DIAGNOSIS — L255 Unspecified contact dermatitis due to plants, except food: Secondary | ICD-10-CM | POA: Diagnosis not present

## 2019-01-18 ENCOUNTER — Other Ambulatory Visit: Payer: Self-pay

## 2019-01-18 ENCOUNTER — Ambulatory Visit (INDEPENDENT_AMBULATORY_CARE_PROVIDER_SITE_OTHER)
Admission: RE | Admit: 2019-01-18 | Discharge: 2019-01-18 | Disposition: A | Discharge status: Home / Self Care | Payer: Self-pay | Source: Ambulatory Visit | Attending: Cardiovascular Disease | Admitting: Cardiovascular Disease

## 2019-01-18 DIAGNOSIS — E785 Hyperlipidemia, unspecified: Secondary | ICD-10-CM

## 2019-01-22 DIAGNOSIS — I2510 Atherosclerotic heart disease of native coronary artery without angina pectoris: Secondary | ICD-10-CM | POA: Diagnosis not present

## 2019-02-27 DIAGNOSIS — E119 Type 2 diabetes mellitus without complications: Secondary | ICD-10-CM | POA: Diagnosis not present

## 2019-02-27 DIAGNOSIS — Z5181 Encounter for therapeutic drug level monitoring: Secondary | ICD-10-CM | POA: Diagnosis not present

## 2019-02-27 DIAGNOSIS — E039 Hypothyroidism, unspecified: Secondary | ICD-10-CM | POA: Diagnosis not present

## 2019-03-08 DIAGNOSIS — E119 Type 2 diabetes mellitus without complications: Secondary | ICD-10-CM | POA: Diagnosis not present

## 2019-03-08 DIAGNOSIS — E039 Hypothyroidism, unspecified: Secondary | ICD-10-CM | POA: Diagnosis not present

## 2019-04-04 DIAGNOSIS — R350 Frequency of micturition: Secondary | ICD-10-CM | POA: Diagnosis not present

## 2019-04-04 DIAGNOSIS — N401 Enlarged prostate with lower urinary tract symptoms: Secondary | ICD-10-CM | POA: Diagnosis not present

## 2019-04-04 DIAGNOSIS — N200 Calculus of kidney: Secondary | ICD-10-CM | POA: Diagnosis not present

## 2019-04-23 ENCOUNTER — Ambulatory Visit: Payer: HMO | Admitting: Cardiology

## 2019-05-08 ENCOUNTER — Other Ambulatory Visit: Payer: Self-pay

## 2019-05-08 NOTE — Patient Outreach (Signed)
  [facility]) Care Management Chronic Special Needs Program  05/08/2019  Name: NYZIER REMMICK DOB: Feb 08, 1951  MRN: [id number]  Mr. Domonick Filter is enrolled in a chronic special needs plan for Diabetes. Reviewed and updated care plan. Client returned call and gave permission to speak to his wife Lyudmilla Dmitiyeva. HIPAA verified Subjective: Wife states that client is controlling his diabetes.  States she cooks all of their meals and packs his lunch for when he is at work.  States he works from 7 to after 7 most weekdays.  States he walks a lot at work and he walks 7-10 miles on the days he is off.  States he has his eye appt scheduled next month.  States his blood sugars range from 107-160 most days.  States his blood pressure is under control with his medication.     Goals Addressed            This Visit's Progress   . COMPLETED:  Acknowledge receipt of Advanced Directive package       Received forms    . Client understands the importance of follow-up with providers by attending scheduled visits   On track   . Client will use Assistive Devices as needed and verbalize understanding of device use   On track   . Client will verbalize knowledge of self management of Hypertension as evidences by BP reading of 140/90 or less; or as defined by provider   On track   . HEMOGLOBIN A1C < 7.0        Diabetes self management actions:  Glucose monitoring per provider recommendations  Eat Healthy  Check feet daily  Visit provider every 3-6 months as directed  Hbg A1C level every 3-6 months.  Eye Exam yearly    . Maintain timely refills of diabetic medication as prescribed within the year .   On track   . Obtain Annual Eye (retinal)  Exam    On track   . Obtain Annual Foot Exam   On track   . COMPLETED: Obtain Hemoglobin A1C at least 2 times per year       Checked 09/01/18, 03/08/19     . COMPLETED: Visit Primary Care Provider or Endocrinologist at least 2 times per  year        09/01/18, 03/08/19 endocrinologist     Client is not meeting diabetes self management goal of hemoglobin A1C of <7% with last reading of 7.7% Reviewed low CHO low sodium diet Encouraged to continue to get regular exercise Reinforced to keep eye exam  Reviewed number for 24-hour nurse Line Reviewed COVID 19 precautions  Plan:  Send successful outreach letter with a copy of their individualized care plan and Send individual care plan to provider  Chronic care management coordinator will outreach in:  6-9 Months per tier level     Peter Garter RN, Jackquline Denmark, [place] Management Coordinator [facility] [phone]

## 2019-05-08 NOTE — Patient Outreach (Signed)
  [facility]) Care Management Chronic Special Needs Program  05/08/2019  Name: Ryan Ferguson DOB: 07-10-1950  MRN: [id number]  Mr. Rocket Wolfe is enrolled in a chronic special needs plan for Diabetes. Client called with no answer No answer and HIPAA compliant message left with family member Plan for 2nd outreach call in 1-2 weeks Chronic care management coordinator will attempt outreach in 1-2 weeks.   Peter Garter RN, Jackquline Denmark, CDE Chronic Care Management Coordinator [facility] [phone]

## 2019-05-24 ENCOUNTER — Ambulatory Visit (INDEPENDENT_AMBULATORY_CARE_PROVIDER_SITE_OTHER): Payer: HMO | Admitting: Cardiology

## 2019-05-24 ENCOUNTER — Encounter: Payer: Self-pay | Admitting: *Deleted

## 2019-05-24 ENCOUNTER — Encounter: Payer: Self-pay | Admitting: Cardiology

## 2019-05-24 ENCOUNTER — Other Ambulatory Visit: Payer: Self-pay

## 2019-05-24 VITALS — BP 148/70 | HR 60 | Ht 65.0 in | Wt 149.0 lb

## 2019-05-24 DIAGNOSIS — M79606 Pain in leg, unspecified: Secondary | ICD-10-CM | POA: Diagnosis not present

## 2019-05-24 DIAGNOSIS — E785 Hyperlipidemia, unspecified: Secondary | ICD-10-CM

## 2019-05-24 DIAGNOSIS — R931 Abnormal findings on diagnostic imaging of heart and coronary circulation: Secondary | ICD-10-CM

## 2019-05-24 DIAGNOSIS — I10 Essential (primary) hypertension: Secondary | ICD-10-CM

## 2019-05-24 MED ORDER — ASPIRIN EC 81 MG PO TBEC: 81.0000 mg | DELAYED_RELEASE_TABLET | Freq: Every day | ORAL | 3 refills | Status: AC

## 2019-05-24 NOTE — Progress Notes (Signed)
Cardiology Office Note:    Date:  05/24/2019   ID:  CHAWN PIECH, DOB [birth year], MRN [id number]  PCP:  Vernie Shanks, MD  Cardiologist:  Candee Furbish, MD  Electrophysiologist:  None   Referring MD: Vernie Shanks, MD     History of Present Illness:    Ryan Ferguson is a 68 y.o. male here for the evaluation of coronary artery disease at the request of Dr. Ronalee Red.  He had a CT scan done that showed coronary calcifications.  Not having any symptoms of chest pain fevers chills nausea vomiting syncope shortness of breath.  Has a history of diabetes hypothyroidism hyperlipidemia and hypertension.  He was not taking atorvastatin 40 mg.  Former smoker 1 pack/day for 50 years. He is also feeling some right calf pain with walking, claudication. His wife also had a calcium score and has a couple risk factors and her score was 0.  Past Medical History:  Diagnosis Date  . Chronic kidney disease   . Diabetes mellitus without complication (HCC)    does not check cbg daily  . Hypertension   . Hyperthyroidism   . Renal calculi     Past Surgical History:  Procedure Laterality Date  . APPENDECTOMY    . COLON SURGERY    . CYSTOSCOPY    . LITHOTRIPSY     twice  . LITHOTRIPSY      Current Medications: Current Meds  Medication Sig  . atorvastatin (LIPITOR) 40 MG tablet Take 40 mg by mouth daily.  . cholecalciferol (VITAMIN D) 1000 UNITS tablet Take 1,000 Units by mouth daily as needed (nutrition).   . Chromium 200 MCG CAPS Take 1 capsule by mouth daily.  . hydrochlorothiazide (MICROZIDE) 12.5 MG capsule Take 12.5 mg by mouth daily.  Marland Kitchen levothyroxine (SYNTHROID) 112 MCG tablet Take 112 mcg by mouth every morning.  Marland Kitchen lisinopril (ZESTRIL) 40 MG tablet Take 40 mg by mouth daily.  . metFORMIN (GLUCOPHAGE) 1000 MG tablet Take 1,000 mg by mouth 2 (two) times daily.  . ONE TOUCH ULTRA TEST test strip USE TEST STRIPS TO TEST BLOOD SUGAR ONCE DAILY  . [DISCONTINUED] aspirin EC 81 MG tablet  Take 81 mg by mouth daily as needed for moderate pain.      Allergies:   Lisinopril   Social History   Socioeconomic History  . Marital status: Married    Spouse name: Not on file  . Number of children: Not on file  . Years of education: Not on file  . Highest education level: Not on file  Occupational History  . Not on file  Social Needs  . Financial resource strain: Not on file  . Food insecurity    Worry: Never true    Inability: Never true  . Transportation needs    Medical: No    Non-medical: No  Tobacco Use  . Smoking status: Current Every Day Smoker    Packs/day: 1.00  . Smokeless tobacco: Never Used  Substance and Sexual Activity  . Alcohol use: No  . Drug use: No  . Sexual activity: Not on file  Lifestyle  . Physical activity    Days per week: Not on file    Minutes per session: Not on file  . Stress: Not on file  Relationships  . Social Herbalist on phone: Not on file    Gets together: Not on file    Attends religious service: Not on file    Active member  of club or organization: Not on file    Attends meetings of clubs or organizations: Not on file    Relationship status: Not on file  Other Topics Concern  . Not on file  Social History Narrative  . Not on file     Family History: The patient's family history includes Cancer in his father and maternal grandfather; Diabetes in his maternal grandmother; Heart disease in his maternal grandfather and paternal grandmother; High Cholesterol in his father.  ROS:   Please see the history of present illness.     All other systems reviewed and are negative.  EKGs/Labs/Other Studies Reviewed:    The following studies were reviewed today:  Cor CT 01/18/19: Coronary arteries: Dense calcium noted in LM, LAD, D1 and circumflex  IMPRESSION: Coronary calcium score of 1031. This was 88% percentile for age and sex matched control.  Consider f/u perfusion study given how high the score is.  EKG:   EKG is  ordered today.  The ekg ordered today demonstrates 05/24/2019-sinus bradycardia 59 with nonspecific T wave flattening.  Recent Labs: No results found for requested labs within last 8760 hours.  Recent Lipid Panel    Component Value Date/Time   CHOL 193 07/09/2013 1446   TRIG 154 (H) 07/09/2013 1446   HDL 44 07/09/2013 1446   CHOLHDL 4.4 07/09/2013 1446   VLDL 31 07/09/2013 1446   LDLCALC 118 (H) 07/09/2013 1446    Physical Exam:    VS:  BP (!) 148/70   Pulse 60   Ht 5\' 5"  (1.651 m)   Wt 149 lb (67.6 kg)   SpO2 98%   BMI 24.79 kg/m     Wt Readings from Last 3 Encounters:  05/24/19 149 lb (67.6 kg)  09/17/13 151 lb (68.5 kg)  09/03/13 154 lb (69.9 kg)     GEN:  Well nourished, well developed in no acute distress HEENT: Normal NECK: No JVD; No carotid bruits LYMPHATICS: No lymphadenopathy CARDIAC: RRR, soft systolic right upper sternal border murmur, no rubs, gallops, harder to palpate posterior tibialis pulse right leg. RESPIRATORY:  Clear to auscultation without rales, wheezing or rhonchi  ABDOMEN: Soft, non-tender, non-distended MUSCULOSKELETAL:  No edema; No deformity  SKIN: Warm and dry NEUROLOGIC:  Alert and oriented x 3 PSYCHIATRIC:  Normal affect   ASSESSMENT:    1. Elevated coronary artery calcium score   2. Essential hypertension   3. Hyperlipidemia, unspecified hyperlipidemia type   4. Pain of lower extremity, unspecified laterality    PLAN:    In order of problems listed above:  Highly elevated coronary calcium score in multiple vessels/coronary artery disease/aortic sclerosis -1031. -Aspirin 81 mg, atorvastatin 40 mg once a day.  Good blood pressure control.  He is stop smoking after 50 years.  Diet, exercise. -We will check a pharmacologic stress test.  If high risk, we will proceed with cardiac catheterization.  If not, we will continue with aggressive medical therapy.  Questions answered.  His wife was on FaceTime.  Hyperlipidemia  -Continue with high intensity statin.  Goal LDL less than 70.  Diabetes with essential hypertension -Continue with current control through Dr. Laverta Baltimore.  Claudication -Right calf pain, somewhat challenging to feel posterior tibialis pulse.  We will go ahead and order bilateral lower extremity ABIs/ultrasound assessment  Medication Adjustments/Labs and Tests Ordered: Current medicines are reviewed at length with the patient today.  Concerns regarding medicines are outlined above.  Orders Placed This Encounter  Procedures  . Myocardial Perfusion Imaging  .  EKG 12-Lead  . VAS [place] LOWER EXTREMITY ARTERIAL DUPLEX   Meds ordered this encounter  Medications  . aspirin EC 81 MG tablet    Sig: Take 1 tablet (81 mg total) by mouth daily.    Dispense:  90 tablet    Refill:  3    Patient Instructions  Medication Instructions:  Please start Asprin 81 mg daily. Continue all other medications as listed.  *If you need a refill on your cardiac medications before your next appointment, please call your pharmacy*  Testing/Procedures: Your physician has requested that you have a lexiscan myoview. For further information please visit [email or web address]. Please follow instruction sheet, as given.  Your physician has requested that you have a lower extremity arterial exercise duplex. During this test, exercise and ultrasound are used to evaluate arterial blood flow in the legs. Allow one hour for this exam. There are no restrictions or special instructions.  Follow-Up: Follow up will be determined after the above testing.  Thank you for choosing [facility]!!        Signed, Candee Furbish, MD  05/24/2019 12:29 PM    [place]

## 2019-05-24 NOTE — Patient Instructions (Signed)
Medication Instructions:  Please start Asprin 81 mg daily. Continue all other medications as listed.  *If you need a refill on your cardiac medications before your next appointment, please call your pharmacy*  Testing/Procedures: Your physician has requested that you have a lexiscan myoview. For further information please visit [email or web address]. Please follow instruction sheet, as given.  Your physician has requested that you have a lower extremity arterial exercise duplex. During this test, exercise and ultrasound are used to evaluate arterial blood flow in the legs. Allow one hour for this exam. There are no restrictions or special instructions.  Follow-Up: Follow up will be determined after the above testing.  Thank you for choosing [facility]!!

## 2019-05-25 ENCOUNTER — Ambulatory Visit: Payer: Self-pay

## 2019-06-11 DIAGNOSIS — N200 Calculus of kidney: Secondary | ICD-10-CM | POA: Diagnosis not present

## 2019-06-11 DIAGNOSIS — M546 Pain in thoracic spine: Secondary | ICD-10-CM | POA: Diagnosis not present

## 2019-06-11 DIAGNOSIS — R1012 Left upper quadrant pain: Secondary | ICD-10-CM | POA: Diagnosis not present

## 2019-06-11 DIAGNOSIS — E119 Type 2 diabetes mellitus without complications: Secondary | ICD-10-CM | POA: Diagnosis not present

## 2019-06-19 DIAGNOSIS — R21 Rash and other nonspecific skin eruption: Secondary | ICD-10-CM | POA: Diagnosis not present

## 2019-06-19 DIAGNOSIS — M549 Dorsalgia, unspecified: Secondary | ICD-10-CM | POA: Diagnosis not present

## 2019-06-29 ENCOUNTER — Ambulatory Visit (HOSPITAL_COMMUNITY): Payer: HMO | Attending: Internal Medicine

## 2019-06-29 ENCOUNTER — Other Ambulatory Visit: Payer: Self-pay

## 2019-06-29 DIAGNOSIS — E119 Type 2 diabetes mellitus without complications: Secondary | ICD-10-CM | POA: Insufficient documentation

## 2019-06-29 DIAGNOSIS — I10 Essential (primary) hypertension: Secondary | ICD-10-CM | POA: Diagnosis not present

## 2019-06-29 DIAGNOSIS — E785 Hyperlipidemia, unspecified: Secondary | ICD-10-CM | POA: Insufficient documentation

## 2019-06-29 DIAGNOSIS — R931 Abnormal findings on diagnostic imaging of heart and coronary circulation: Secondary | ICD-10-CM | POA: Insufficient documentation

## 2019-06-29 DIAGNOSIS — I2510 Atherosclerotic heart disease of native coronary artery without angina pectoris: Secondary | ICD-10-CM | POA: Diagnosis not present

## 2019-06-29 LAB — MYOCARDIAL PERFUSION IMAGING
LV dias vol: 85 mL (ref 62–150)
LV sys vol: 32 mL
Peak HR: 90 {beats}/min
Rest HR: 52 {beats}/min
SDS: 0
SRS: 0
SSS: 0
TID: 0.98

## 2019-06-29 MED ORDER — TECHNETIUM TC 99M TETROFOSMIN IV KIT
31.2000 | PACK | Freq: Once | INTRAVENOUS | Status: AC | PRN
  Administered 2019-06-29: 31.2 via INTRAVENOUS
  Filled 2019-06-29: qty 32

## 2019-06-29 MED ORDER — REGADENOSON 0.4 MG/5ML IV SOLN
0.4000 mg | Freq: Once | INTRAVENOUS | Status: AC
  Administered 2019-06-29: 0.4 mg via INTRAVENOUS

## 2019-06-29 MED ORDER — TECHNETIUM TC 99M TETROFOSMIN IV KIT
10.9000 | PACK | Freq: Once | INTRAVENOUS | Status: AC | PRN
  Administered 2019-06-29: 10.9 via INTRAVENOUS
  Filled 2019-06-29: qty 11

## 2019-07-03 ENCOUNTER — Other Ambulatory Visit: Payer: Self-pay | Admitting: Cardiology

## 2019-07-03 DIAGNOSIS — M79606 Pain in leg, unspecified: Secondary | ICD-10-CM

## 2019-07-04 ENCOUNTER — Other Ambulatory Visit: Payer: Self-pay

## 2019-07-04 ENCOUNTER — Ambulatory Visit (HOSPITAL_COMMUNITY)
Admission: RE | Admit: 2019-07-04 | Discharge: 2019-07-04 | Disposition: A | Discharge status: Home / Self Care | Payer: HMO | Source: Ambulatory Visit | Attending: Cardiology | Admitting: Cardiology

## 2019-07-04 DIAGNOSIS — R350 Frequency of micturition: Secondary | ICD-10-CM | POA: Diagnosis not present

## 2019-07-04 DIAGNOSIS — M79606 Pain in leg, unspecified: Secondary | ICD-10-CM | POA: Diagnosis not present

## 2019-07-04 DIAGNOSIS — N200 Calculus of kidney: Secondary | ICD-10-CM | POA: Diagnosis not present

## 2019-07-04 DIAGNOSIS — N281 Cyst of kidney, acquired: Secondary | ICD-10-CM | POA: Diagnosis not present

## 2019-07-04 DIAGNOSIS — N401 Enlarged prostate with lower urinary tract symptoms: Secondary | ICD-10-CM | POA: Diagnosis not present

## 2019-07-05 ENCOUNTER — Other Ambulatory Visit (HOSPITAL_COMMUNITY): Payer: Self-pay | Admitting: Urology

## 2019-07-05 ENCOUNTER — Other Ambulatory Visit: Payer: Self-pay | Admitting: Nurse Practitioner

## 2019-07-05 ENCOUNTER — Other Ambulatory Visit: Payer: Self-pay | Admitting: Urology

## 2019-07-05 DIAGNOSIS — N281 Cyst of kidney, acquired: Secondary | ICD-10-CM

## 2019-07-11 ENCOUNTER — Encounter: Payer: Self-pay | Admitting: *Deleted

## 2019-07-13 ENCOUNTER — Ambulatory Visit (HOSPITAL_COMMUNITY)
Admission: RE | Admit: 2019-07-13 | Discharge: 2019-07-13 | Disposition: A | Discharge status: Home / Self Care | Payer: HMO | Source: Ambulatory Visit | Attending: Urology | Admitting: Urology

## 2019-07-13 ENCOUNTER — Other Ambulatory Visit: Payer: Self-pay

## 2019-07-13 DIAGNOSIS — N281 Cyst of kidney, acquired: Secondary | ICD-10-CM

## 2019-07-13 DIAGNOSIS — N2889 Other specified disorders of kidney and ureter: Secondary | ICD-10-CM | POA: Diagnosis not present

## 2019-07-13 MED ORDER — GADOBUTROL 1 MMOL/ML IV SOLN
7.0000 mL | Freq: Once | INTRAVENOUS | Status: AC | PRN
  Administered 2019-07-13: 7 mL via INTRAVENOUS

## 2019-07-24 DIAGNOSIS — M5416 Radiculopathy, lumbar region: Secondary | ICD-10-CM | POA: Diagnosis not present

## 2019-07-27 DIAGNOSIS — H2513 Age-related nuclear cataract, bilateral: Secondary | ICD-10-CM | POA: Diagnosis not present

## 2019-07-27 DIAGNOSIS — E119 Type 2 diabetes mellitus without complications: Secondary | ICD-10-CM | POA: Diagnosis not present

## 2019-07-27 DIAGNOSIS — D3102 Benign neoplasm of left conjunctiva: Secondary | ICD-10-CM | POA: Diagnosis not present

## 2019-08-16 ENCOUNTER — Ambulatory Visit: Payer: Self-pay

## 2019-08-20 ENCOUNTER — Ambulatory Visit: Payer: HMO | Attending: Internal Medicine

## 2019-08-20 DIAGNOSIS — Z23 Encounter for immunization: Secondary | ICD-10-CM | POA: Insufficient documentation

## 2019-08-20 NOTE — Progress Notes (Signed)
   Covid-19 Vaccination Clinic  Name:  Ryan Ferguson    MRN: [id number] DOB: 20-Sep-1950  08/20/2019  [name] was observed post Covid-19 immunization for 15 minutes without incidence. He was provided with Vaccine Information Sheet and instruction to access the V-Safe system.   [name] was instructed to call 911 with any severe reactions post vaccine: Marland Kitchen Difficulty breathing  . Swelling of your face and throat  . A fast heartbeat  . A bad rash all over your body  . Dizziness and weakness    Immunizations Administered    Name Date Dose VIS Date Route   Pfizer COVID-19 Vaccine 08/20/2019  8:31 AM 0.3 mL 06/01/2019 Intramuscular   Manufacturer: [place]   Lot: [id number]   [place]: [id number]

## 2019-08-21 DIAGNOSIS — M5416 Radiculopathy, lumbar region: Secondary | ICD-10-CM | POA: Diagnosis not present

## 2019-09-03 DIAGNOSIS — E119 Type 2 diabetes mellitus without complications: Secondary | ICD-10-CM | POA: Diagnosis not present

## 2019-09-03 DIAGNOSIS — Z5181 Encounter for therapeutic drug level monitoring: Secondary | ICD-10-CM | POA: Diagnosis not present

## 2019-09-03 DIAGNOSIS — Z79899 Other long term (current) drug therapy: Secondary | ICD-10-CM | POA: Diagnosis not present

## 2019-09-03 DIAGNOSIS — H1131 Conjunctival hemorrhage, right eye: Secondary | ICD-10-CM | POA: Diagnosis not present

## 2019-09-03 DIAGNOSIS — E039 Hypothyroidism, unspecified: Secondary | ICD-10-CM | POA: Diagnosis not present

## 2019-09-18 ENCOUNTER — Ambulatory Visit: Payer: HMO | Attending: Internal Medicine

## 2019-09-18 DIAGNOSIS — Z23 Encounter for immunization: Secondary | ICD-10-CM

## 2019-09-18 NOTE — Progress Notes (Signed)
   Covid-19 Vaccination Clinic  Name:  Ryan Ferguson    MRN: [id number] DOB: December 20, 1950  09/18/2019  Ryan Ferguson was observed post Covid-19 immunization for 15 minutes without incident. He was provided with Vaccine Information Sheet and instruction to access the V-Safe system.   Ryan Ferguson was instructed to call 911 with any severe reactions post vaccine: Marland Kitchen Difficulty breathing  . Swelling of face and throat  . A fast heartbeat  . A bad rash all over body  . Dizziness and weakness   Immunizations Administered    Name Date Dose VIS Date Route   Pfizer COVID-19 Vaccine 09/18/2019  8:42 AM 0.3 mL 06/01/2019 Intramuscular   Manufacturer: [place]   Lot: [id number]   [place]: [id number]

## 2019-09-26 DIAGNOSIS — M9902 Segmental and somatic dysfunction of thoracic region: Secondary | ICD-10-CM | POA: Diagnosis not present

## 2019-09-26 DIAGNOSIS — M5137 Other intervertebral disc degeneration, lumbosacral region: Secondary | ICD-10-CM | POA: Diagnosis not present

## 2019-09-26 DIAGNOSIS — M9904 Segmental and somatic dysfunction of sacral region: Secondary | ICD-10-CM | POA: Diagnosis not present

## 2019-09-26 DIAGNOSIS — M9903 Segmental and somatic dysfunction of lumbar region: Secondary | ICD-10-CM | POA: Diagnosis not present

## 2019-09-26 DIAGNOSIS — M5415 Radiculopathy, thoracolumbar region: Secondary | ICD-10-CM | POA: Diagnosis not present

## 2019-09-26 DIAGNOSIS — M5416 Radiculopathy, lumbar region: Secondary | ICD-10-CM | POA: Diagnosis not present

## 2019-10-01 DIAGNOSIS — M5415 Radiculopathy, thoracolumbar region: Secondary | ICD-10-CM | POA: Diagnosis not present

## 2019-10-01 DIAGNOSIS — M9904 Segmental and somatic dysfunction of sacral region: Secondary | ICD-10-CM | POA: Diagnosis not present

## 2019-10-01 DIAGNOSIS — M5137 Other intervertebral disc degeneration, lumbosacral region: Secondary | ICD-10-CM | POA: Diagnosis not present

## 2019-10-01 DIAGNOSIS — M9902 Segmental and somatic dysfunction of thoracic region: Secondary | ICD-10-CM | POA: Diagnosis not present

## 2019-10-01 DIAGNOSIS — M5416 Radiculopathy, lumbar region: Secondary | ICD-10-CM | POA: Diagnosis not present

## 2019-10-01 DIAGNOSIS — M9903 Segmental and somatic dysfunction of lumbar region: Secondary | ICD-10-CM | POA: Diagnosis not present

## 2019-10-02 DIAGNOSIS — M5137 Other intervertebral disc degeneration, lumbosacral region: Secondary | ICD-10-CM | POA: Diagnosis not present

## 2019-10-02 DIAGNOSIS — M9903 Segmental and somatic dysfunction of lumbar region: Secondary | ICD-10-CM | POA: Diagnosis not present

## 2019-10-02 DIAGNOSIS — M5416 Radiculopathy, lumbar region: Secondary | ICD-10-CM | POA: Diagnosis not present

## 2019-10-02 DIAGNOSIS — M9904 Segmental and somatic dysfunction of sacral region: Secondary | ICD-10-CM | POA: Diagnosis not present

## 2019-10-02 DIAGNOSIS — M5415 Radiculopathy, thoracolumbar region: Secondary | ICD-10-CM | POA: Diagnosis not present

## 2019-10-02 DIAGNOSIS — M9902 Segmental and somatic dysfunction of thoracic region: Secondary | ICD-10-CM | POA: Diagnosis not present

## 2019-10-05 DIAGNOSIS — M5415 Radiculopathy, thoracolumbar region: Secondary | ICD-10-CM | POA: Diagnosis not present

## 2019-10-05 DIAGNOSIS — M5416 Radiculopathy, lumbar region: Secondary | ICD-10-CM | POA: Diagnosis not present

## 2019-10-05 DIAGNOSIS — M9904 Segmental and somatic dysfunction of sacral region: Secondary | ICD-10-CM | POA: Diagnosis not present

## 2019-10-05 DIAGNOSIS — M9902 Segmental and somatic dysfunction of thoracic region: Secondary | ICD-10-CM | POA: Diagnosis not present

## 2019-10-05 DIAGNOSIS — M9903 Segmental and somatic dysfunction of lumbar region: Secondary | ICD-10-CM | POA: Diagnosis not present

## 2019-10-05 DIAGNOSIS — M5137 Other intervertebral disc degeneration, lumbosacral region: Secondary | ICD-10-CM | POA: Diagnosis not present

## 2019-10-10 DIAGNOSIS — M9904 Segmental and somatic dysfunction of sacral region: Secondary | ICD-10-CM | POA: Diagnosis not present

## 2019-10-10 DIAGNOSIS — M5137 Other intervertebral disc degeneration, lumbosacral region: Secondary | ICD-10-CM | POA: Diagnosis not present

## 2019-10-10 DIAGNOSIS — M9903 Segmental and somatic dysfunction of lumbar region: Secondary | ICD-10-CM | POA: Diagnosis not present

## 2019-10-10 DIAGNOSIS — M9902 Segmental and somatic dysfunction of thoracic region: Secondary | ICD-10-CM | POA: Diagnosis not present

## 2019-10-10 DIAGNOSIS — M5416 Radiculopathy, lumbar region: Secondary | ICD-10-CM | POA: Diagnosis not present

## 2019-10-10 DIAGNOSIS — M5415 Radiculopathy, thoracolumbar region: Secondary | ICD-10-CM | POA: Diagnosis not present

## 2019-10-11 DIAGNOSIS — M9902 Segmental and somatic dysfunction of thoracic region: Secondary | ICD-10-CM | POA: Diagnosis not present

## 2019-10-11 DIAGNOSIS — M5137 Other intervertebral disc degeneration, lumbosacral region: Secondary | ICD-10-CM | POA: Diagnosis not present

## 2019-10-11 DIAGNOSIS — M9904 Segmental and somatic dysfunction of sacral region: Secondary | ICD-10-CM | POA: Diagnosis not present

## 2019-10-11 DIAGNOSIS — M5416 Radiculopathy, lumbar region: Secondary | ICD-10-CM | POA: Diagnosis not present

## 2019-10-11 DIAGNOSIS — M5415 Radiculopathy, thoracolumbar region: Secondary | ICD-10-CM | POA: Diagnosis not present

## 2019-10-11 DIAGNOSIS — M9903 Segmental and somatic dysfunction of lumbar region: Secondary | ICD-10-CM | POA: Diagnosis not present

## 2019-10-22 ENCOUNTER — Ambulatory Visit: Payer: Self-pay

## 2019-10-25 DIAGNOSIS — M9903 Segmental and somatic dysfunction of lumbar region: Secondary | ICD-10-CM | POA: Diagnosis not present

## 2019-10-25 DIAGNOSIS — M5416 Radiculopathy, lumbar region: Secondary | ICD-10-CM | POA: Diagnosis not present

## 2019-10-25 DIAGNOSIS — M9902 Segmental and somatic dysfunction of thoracic region: Secondary | ICD-10-CM | POA: Diagnosis not present

## 2019-10-25 DIAGNOSIS — M5415 Radiculopathy, thoracolumbar region: Secondary | ICD-10-CM | POA: Diagnosis not present

## 2019-10-25 DIAGNOSIS — M9904 Segmental and somatic dysfunction of sacral region: Secondary | ICD-10-CM | POA: Diagnosis not present

## 2019-10-25 DIAGNOSIS — M5137 Other intervertebral disc degeneration, lumbosacral region: Secondary | ICD-10-CM | POA: Diagnosis not present

## 2019-10-29 DIAGNOSIS — M5416 Radiculopathy, lumbar region: Secondary | ICD-10-CM | POA: Diagnosis not present

## 2019-10-29 DIAGNOSIS — M9903 Segmental and somatic dysfunction of lumbar region: Secondary | ICD-10-CM | POA: Diagnosis not present

## 2019-10-29 DIAGNOSIS — M5415 Radiculopathy, thoracolumbar region: Secondary | ICD-10-CM | POA: Diagnosis not present

## 2019-10-29 DIAGNOSIS — M5137 Other intervertebral disc degeneration, lumbosacral region: Secondary | ICD-10-CM | POA: Diagnosis not present

## 2019-10-29 DIAGNOSIS — M9902 Segmental and somatic dysfunction of thoracic region: Secondary | ICD-10-CM | POA: Diagnosis not present

## 2019-10-29 DIAGNOSIS — M9904 Segmental and somatic dysfunction of sacral region: Secondary | ICD-10-CM | POA: Diagnosis not present

## 2019-10-30 DIAGNOSIS — M9903 Segmental and somatic dysfunction of lumbar region: Secondary | ICD-10-CM | POA: Diagnosis not present

## 2019-10-30 DIAGNOSIS — M9902 Segmental and somatic dysfunction of thoracic region: Secondary | ICD-10-CM | POA: Diagnosis not present

## 2019-10-30 DIAGNOSIS — M5416 Radiculopathy, lumbar region: Secondary | ICD-10-CM | POA: Diagnosis not present

## 2019-10-30 DIAGNOSIS — M5137 Other intervertebral disc degeneration, lumbosacral region: Secondary | ICD-10-CM | POA: Diagnosis not present

## 2019-10-30 DIAGNOSIS — M9904 Segmental and somatic dysfunction of sacral region: Secondary | ICD-10-CM | POA: Diagnosis not present

## 2019-10-30 DIAGNOSIS — M5415 Radiculopathy, thoracolumbar region: Secondary | ICD-10-CM | POA: Diagnosis not present

## 2019-10-31 ENCOUNTER — Ambulatory Visit: Payer: Self-pay

## 2019-11-06 ENCOUNTER — Ambulatory Visit: Payer: Self-pay

## 2019-11-07 DIAGNOSIS — M9902 Segmental and somatic dysfunction of thoracic region: Secondary | ICD-10-CM | POA: Diagnosis not present

## 2019-11-07 DIAGNOSIS — M5416 Radiculopathy, lumbar region: Secondary | ICD-10-CM | POA: Diagnosis not present

## 2019-11-07 DIAGNOSIS — M5415 Radiculopathy, thoracolumbar region: Secondary | ICD-10-CM | POA: Diagnosis not present

## 2019-11-07 DIAGNOSIS — M5137 Other intervertebral disc degeneration, lumbosacral region: Secondary | ICD-10-CM | POA: Diagnosis not present

## 2019-11-07 DIAGNOSIS — M9904 Segmental and somatic dysfunction of sacral region: Secondary | ICD-10-CM | POA: Diagnosis not present

## 2019-11-07 DIAGNOSIS — M9903 Segmental and somatic dysfunction of lumbar region: Secondary | ICD-10-CM | POA: Diagnosis not present

## 2019-11-08 ENCOUNTER — Ambulatory Visit: Payer: HMO | Attending: Family Medicine | Admitting: Physical Therapy

## 2019-11-08 ENCOUNTER — Other Ambulatory Visit: Payer: Self-pay

## 2019-11-08 ENCOUNTER — Encounter: Payer: Self-pay | Admitting: Physical Therapy

## 2019-11-08 DIAGNOSIS — M9904 Segmental and somatic dysfunction of sacral region: Secondary | ICD-10-CM | POA: Diagnosis not present

## 2019-11-08 DIAGNOSIS — M9903 Segmental and somatic dysfunction of lumbar region: Secondary | ICD-10-CM | POA: Diagnosis not present

## 2019-11-08 DIAGNOSIS — M5137 Other intervertebral disc degeneration, lumbosacral region: Secondary | ICD-10-CM | POA: Diagnosis not present

## 2019-11-08 DIAGNOSIS — G8929 Other chronic pain: Secondary | ICD-10-CM | POA: Insufficient documentation

## 2019-11-08 DIAGNOSIS — M545 Low back pain, unspecified: Secondary | ICD-10-CM

## 2019-11-08 DIAGNOSIS — R2689 Other abnormalities of gait and mobility: Secondary | ICD-10-CM | POA: Insufficient documentation

## 2019-11-08 DIAGNOSIS — M5415 Radiculopathy, thoracolumbar region: Secondary | ICD-10-CM | POA: Diagnosis not present

## 2019-11-08 DIAGNOSIS — M9902 Segmental and somatic dysfunction of thoracic region: Secondary | ICD-10-CM | POA: Diagnosis not present

## 2019-11-08 DIAGNOSIS — M5416 Radiculopathy, lumbar region: Secondary | ICD-10-CM | POA: Diagnosis not present

## 2019-11-08 NOTE — Therapy (Signed)
[place], [place], [address] Phone: [phone]   Fax:  [phone]  Physical Therapy Evaluation  Patient Details  Name: Ryan Ferguson MRN: [id number] Date of Birth: 1951-02-26 Referring Provider (PT): Vernie Shanks, MD    Encounter Date: 11/08/2019  PT End of Session - 11/08/19 0817    Visit Number  1    Number of Visits  13    Date for PT Re-Evaluation  12/20/19    Authorization Type  [employer]: Kx mod at 15th visit    Progress Note Due on Visit  10    PT Start Time  0800    PT Stop Time  0842    PT Time Calculation (min)  42 min    Activity Tolerance  Patient tolerated treatment well    Behavior During Therapy  [facility] for tasks assessed/performed       Past Medical History:  Diagnosis Date  . Chronic kidney disease   . Diabetes mellitus without complication (HCC)    does not check cbg daily  . Hypertension   . Hyperthyroidism   . Renal calculi     Past Surgical History:  Procedure Laterality Date  . APPENDECTOMY    . COLON SURGERY    . CYSTOSCOPY    . LITHOTRIPSY     twice  . LITHOTRIPSY      There were no vitals filed for this visit.   Subjective Assessment - 11/08/19 0804    Subjective  pt is a 69 y.o M with CC of bil LE pain that started about a month ago. he reports the pain goes down the legs to the knee. He denies any N/T. he denies any red flags. since onset he notes symtpoms have been worsening. He reports its worse in the morning, and improves in the middle of the day and gets worse again at the end of the day.    Limitations  Walking    How long can you sit comfortably?  unsure    How long can you stand comfortably?  unlimited    How long can you walk comfortably?  5-10 min    Diagnostic tests  x-ray at [facility] office    Currently in Pain?  Yes    Pain Score  3     Pain Location  Back    Pain Radiating Towards  down bil legs to the knee    Aggravating Factors    stairs going up. lifting the leg    Pain Relieving Factors  pulling knee to chest, walking, sitting    Effect of Pain on Daily Activities  limited walking/ standing endurnace. difficulty with stairs.         [facility] PT Assessment - 11/08/19 0811      Assessment   Medical Diagnosis  Radiculopathy, lumbar region M54.16    Referring Provider (PT)  Vernie Shanks, MD     Onset Date/Surgical Date  --   1 month ago   Hand Dominance  Right    Next MD Visit  make one PRN    Prior Therapy  no      Precautions   Precautions  None      Restrictions   Weight Bearing Restrictions  No      Balance Screen   Has the patient fallen in the past 6 months  No    Has the patient had a decrease in activity level because of a fear of  falling?   No    Is the patient reluctant to leave their home because of a fear of falling?   No      Home Film/video editor residence      Prior Function   Level of Independence  Independent with basic ADLs    Vocation  Full time employment   maintenance   Vocation Requirements  standing, walking, lifting and carrying      Cognition   Overall Cognitive Status  Within Functional Limits for tasks assessed      Observation/Other Assessments   Focus on Therapeutic Outcomes (FOTO)   52% limited   predicted 37% limited     Sensation   Light Touch  Appears Intact      Posture/Postural Control   Posture/Postural Control  Postural limitations    Postural Limitations  Rounded Shoulders;Forward head;Decreased lumbar lordosis      ROM / Strength   AROM / PROM / Strength  AROM;Strength      AROM   AROM Assessment Site  Lumbar    Lumbar Flexion  92    Lumbar Extension  20    Lumbar - Right Side Bend  30    Lumbar - Left Side Bend  30    Lumbar - Right Rotation  50%    Lumbar - Left Rotation  50%      Strength   Strength Assessment Site  Hip;Knee    Right/Left Hip  Right;Left    Right Hip Flexion  4+/5    Right Hip Extension  3/5     Right Hip ABduction  3+/5    Right Hip ADduction  5/5    Left Hip Flexion  4+/5    Left Hip Extension  3/5    Left Hip ABduction  3+/5    Left Hip ADduction  5/5    Right/Left Knee  Left;Right    Right Knee Flexion  5/5    Right Knee Extension  5/5    Left Knee Flexion  5/5    Left Knee Extension  5/5      Palpation   Spinal mobility  hypomobility of L1-L5 PA PAIVM    Palpation comment  TTP along L2 and L3 spinous process and increased stiffness noted in bil lumbar paraspinals.       Special Tests    Special Tests  Lumbar      Ambulation/Gait   Gait Pattern  Step-through pattern;Decreased stride length;Antalgic;Trunk flexed;Wide base of support    Gait Comments  gait improved after                   Objective measurements completed on examination: See above findings.      [facility] PT Treatment/Exercise - 11/08/19 0811      Exercises   Exercises  Lumbar      Lumbar Exercises: Stretches   Hip Flexor Stretch  2 reps;30 seconds   standing   Hip Flexor Stretch Limitations  cues to only go to where he feels a stretch and not pain, if pain to back off stretch to prevent aggrivation             PT Education - 11/08/19 0816    Education Details  evaluation findings, POC, goals, HEP with proper form.    Person(s) Educated  Patient    Methods  Explanation;Verbal cues;Handout    Comprehension  Verbalized understanding;Verbal cues required       PT Short  Term Goals - 11/08/19 0914      PT SHORT TERM GOAL #1   Title  pt to be I with inital HEP    Time  3    Period  Weeks    Status  New    Target Date  11/29/19        PT Long Term Goals - 11/08/19 0929      PT LONG TERM GOAL #1   Title  pt to verbalize/ demo efficient posture and lifting mechanics to reduce and prevent low back and referred LE pain.    Time  6    Period  Weeks    Status  New    Target Date  12/20/19      PT LONG TERM GOAL #2   Title  increase hip abductor/ extensor  strength to >/= 4+/5 to promote efficient lifting mechanics    Time  6    Period  Weeks    Status  New    Target Date  12/20/19      PT LONG TERM GOAL #3   Title  pt be able to walk/ stand >/= 45 min and navigate up/ down >/= 12 steps reciprocally with </= 2/10 referred pain for functional mobility    Time  6    Period  Weeks    Status  New    Target Date  12/20/19      PT LONG TERM GOAL #4   Title  increase FOTO score to </= 37% limited to demo improvement in function    Time  6    Period  Weeks    Status  New    Target Date  12/20/19      PT LONG TERM GOAL #5   Title  pt to be I with all HEP given  to maintain and progress current level of function    Time  6    Period  Weeks    Status  New    Target Date  12/20/19             Plan - 11/08/19 0818    Clinical Impression Statement  pt presents to OPPT with dx for lumbar radiculopahty and reports CC as pain that radiates to bil knees starting a month ago with no specific onset. He demonstrates functional trunk mobilty with report of concordant pain with extension, and weakness in hip abductors/ extensors. based on examination and pt subjective hx he exhibits a flexion bias. He would benefit from physicla therapy to reduce referred pain the legs, improve gross hip strength, and maximize funciton by addressing the deficits listed.    Personal Factors and Comorbidities  Comorbidity 1;Age    Comorbidities  hx of DM    Examination-Activity Limitations  Stand;Locomotion Level;Stairs    Stability/Clinical Decision Making  Evolving/Moderate complexity    Clinical Decision Making  Moderate    Rehab Potential  Good    PT Frequency  2x / week    PT Duration  6 weeks    PT Treatment/Interventions  ADLs/Self Care Home Management;Cryotherapy;Electrical Stimulation;Iontophoresis 4mg /ml Dexamethasone;Moist Heat;Ultrasound;Traction;Therapeutic activities;Therapeutic exercise;Balance training;Neuromuscular re-education;Manual  techniques;Dry needling;Taping;Passive range of motion;Spinal Manipulations;Patient/family education    PT Next Visit Plan  review/ update HEP PRN, provide pt FOTO handout, flexion biased exercises, hip flexor stretching, hip abductor/ extensor strengthening.    PT Home Exercise Plan  [id number] -  hip flexor stretching, LTR, single knee to chest stretch, supine marching    Consulted and Agree with Plan  of Care  Patient       Patient will benefit from skilled therapeutic intervention in order to improve the following deficits and impairments:  Improper body mechanics, Increased muscle spasms, Decreased strength, Abnormal gait, Pain, Decreased activity tolerance, Decreased balance, Decreased endurance, Postural dysfunction  Visit Diagnosis: Chronic low back pain, unspecified back pain laterality, unspecified whether sciatica present  Other abnormalities of gait and mobility     Problem List Patient Active Problem List   Diagnosis Date Noted  . Hypertension   . Chronic kidney disease   . Hyperthyroidism   . Diabetes mellitus without complication ([place])   . Renal calculi   . Type II or unspecified type diabetes mellitus without mention of complication, not stated as uncontrolled 11/27/2012   Starr Lake PT, DPT, LAT, ATC  11/08/19  9:38 AM      [facility] [address] [place], [place], [address] Phone: [phone]   Fax:  [phone]  Name: Ryan Ferguson MRN: [id number] Date of Birth: 1951-05-09

## 2019-11-12 DIAGNOSIS — M9902 Segmental and somatic dysfunction of thoracic region: Secondary | ICD-10-CM | POA: Diagnosis not present

## 2019-11-12 DIAGNOSIS — M5416 Radiculopathy, lumbar region: Secondary | ICD-10-CM | POA: Diagnosis not present

## 2019-11-12 DIAGNOSIS — M9903 Segmental and somatic dysfunction of lumbar region: Secondary | ICD-10-CM | POA: Diagnosis not present

## 2019-11-12 DIAGNOSIS — M5137 Other intervertebral disc degeneration, lumbosacral region: Secondary | ICD-10-CM | POA: Diagnosis not present

## 2019-11-12 DIAGNOSIS — M5415 Radiculopathy, thoracolumbar region: Secondary | ICD-10-CM | POA: Diagnosis not present

## 2019-11-12 DIAGNOSIS — M9904 Segmental and somatic dysfunction of sacral region: Secondary | ICD-10-CM | POA: Diagnosis not present

## 2019-11-13 ENCOUNTER — Other Ambulatory Visit: Payer: Self-pay

## 2019-11-13 NOTE — Patient Outreach (Signed)
[place] [facility]) Care Management Chronic Special Needs Program  11/13/2019  Name: Ryan Ferguson DOB: [birth year]  MRN: [id number]  [name] is enrolled in a chronic special needs plan for Diabetes. Chronic Care Management Coordinator telephoned client to review health risk assessment and to develop individualized care plan.  Introduced the chronic care management program, importance of client participation, and taking their care plan to all provider appointments and inpatient facilities.  Reviewed the transition of care process and possible referral to community care management. Client gives permission to speak with wife Ryan Ferguson HIPAA verified  Subjective: States that he saw his endocrinologist a few months ago and his diabetes is doing good.  States his blood sugars range from 100-135 Wife states she cooks them healthy meals.  Denies any low blood sugars.  States his B/P is good with his medication.  States he walks for exercise.  States he has had both of his COVID shots.  States he has the Advanced Directives forms but does not wish to complete them at this time.  States he had his eye exam in February. Client states his only goal is to keep his diabetes in control   Goals Addressed            This Visit's Progress   . COMPLETED: Client understands the importance of follow-up with providers by attending scheduled visits   On track    Reports keeping scheduled appointments Reinforced to keep scheduled appointments with providers Goal completed 11/13/19     . COMPLETED: Client will use Assistive Devices as needed and verbalize understanding of device use   On track    Reports no issues with use of glucometer Goal completed 11/13/19    . Client will verbalize knowledge of self management of Hypertension as evidences by BP reading of 140/90 or less; or as defined by provider   On track    Reports B/P under 140/90 when checking and at providers Plan to  check B/P regularly Take B/P medications as ordered Plan to follow a low salt diet  Increase activity as tolerated Sent EMMI: High Blood Pressure (Hypertension) What you can do     . Diabetes Patient stated goal to keep hemoglobin A1C 7 or below for the next 12 months (pt-stated)       Reviewed fasting blood sugar goals of 80-130 and less than 180 1 1/2-2 hours after meals Reinforced to follow a low carbohydrate low salt diet and to watch portion sizes Reviewed use and possible side effects of diabetes medications  Reviewed signs and symptoms of hypoglycemia and actions to take Please review diabetes action plan in [place] calendar    . HEMOGLOBIN A1C < 7 (pt-stated)        Diabetes self management stated goal is to keep Hemoglobin A1C 7 % or below Last Hemoglobin A1C 6.9% on 09/03/19    . COMPLETED: Maintain timely refills of diabetic medication as prescribed within the year .   On track    Maintaining timely refills of medications per dispense report Reinforced importance of getting medications refilled on time Goal completed 11/13/19    . COMPLETED: Obtain annual  Lipid Profile, LDL-C       Completed 09/03/19 LDL 98 The goal for LDL is less than 70 mg/dL as you are at high risk for complications Try to avoid saturated fats, trans-fats and eat more fiber Plan to take statin as ordered  Goal completed 11/13/19     .  Obtain Annual Eye (retinal)  Exam    On track    Completed 07/27/19 Plan to have annual dilated eye exam Goal completed 11/13/19    . COMPLETED: Obtain Annual Foot Exam   On track    Completed 09/03/19 Check your skin and feet every day for cuts, bruises, redness, blisters, or sores. Report to provider any problems with your feet Schedule a foot exam with your health care provider once every year Goal completed 11/13/19    . Obtain annual screen for micro albuminuria (urine) , nephropathy (kidney problems)   On track    Completed 09/01/18 It is important for  your doctor to check your urine for protein at least every year    . Obtain Hemoglobin A1C at least 2 times per year   On track    Completed 09/03/19 It is important to have your Hemoglobin A1C checked every 6 months if you are at goal and every 3 months if you are not at goal    . Visit Primary Care Provider or Endocrinologist at least 2 times per year    On track    Primary care 06/19/19 Endocrinologist 09/03/19 Please schedule your annual wellness visit     Client is  meeting diabetes self management goal of hemoglobin A1C of <7% with last reading of 6.9%  Plan:  Send successful outreach letter with a copy of their individualized care plan, Send individual care plan to provider and Send educational material-EMMI-High Blood Pressure(Hypertension): What you can do  Chronic care management coordination will outreach in:  12 months per tier level     [place], [facility], [place] Management [phone]

## 2019-11-22 ENCOUNTER — Ambulatory Visit: Payer: HMO | Attending: Family Medicine | Admitting: Physical Therapy

## 2019-11-22 ENCOUNTER — Encounter: Payer: Self-pay | Admitting: Physical Therapy

## 2019-11-22 ENCOUNTER — Other Ambulatory Visit: Payer: Self-pay

## 2019-11-22 DIAGNOSIS — R2689 Other abnormalities of gait and mobility: Secondary | ICD-10-CM | POA: Insufficient documentation

## 2019-11-22 DIAGNOSIS — G8929 Other chronic pain: Secondary | ICD-10-CM | POA: Diagnosis not present

## 2019-11-22 DIAGNOSIS — M545 Low back pain, unspecified: Secondary | ICD-10-CM

## 2019-11-23 NOTE — Therapy (Signed)
[place], [place], [address] Phone: [phone]   Fax:  [phone]  Physical Therapy Treatment  Patient Details  Name: Ryan Ferguson MRN: [id number] Date of Birth: 03-18-1951 Referring Provider (PT): Vernie Shanks, MD    Encounter Date: 11/22/2019  PT End of Session - 11/22/19 1625    Visit Number  2    Number of Visits  13    Date for PT Re-Evaluation  12/20/19    Authorization Type  healthteam advantage: Kx mod at 15th visit    PT Start Time  1100    PT Stop Time  1140    PT Time Calculation (min)  40 min    Activity Tolerance  Patient tolerated treatment well    Behavior During Therapy  [facility] for tasks assessed/performed       Past Medical History:  Diagnosis Date  . Chronic kidney disease   . Diabetes mellitus without complication (HCC)    does not check cbg daily  . Hypertension   . Hyperthyroidism   . Renal calculi     Past Surgical History:  Procedure Laterality Date  . APPENDECTOMY    . COLON SURGERY    . CYSTOSCOPY    . LITHOTRIPSY     twice  . LITHOTRIPSY      There were no vitals filed for this visit.  Subjective Assessment - 11/22/19 1110    Subjective  Patient reports that his pain has changed It is now in his buttock and groin but it is worse. He reports since he went to the chiropracter he can not longer exercise. His pain is limiting his ability to walk.    Currently in Pain?  Yes   could not get a number from the pain scale but patient said his pain level is higher but diffierent   Pain Location  Back    Pain Orientation  Left    Pain Descriptors / Indicators  Aching    Pain Type  Chronic pain    Pain Radiating Towards  down legs and in the froin. Into the buttocks    Pain Onset  More than a month ago    Pain Frequency  Intermittent    Aggravating Factors   stairs going up/ lifting the leg    Pain Relieving Factors  pulling the knee to the chest    Effect of  Pain on Daily Activities  limits walking and enduance                        [facility] Adult PT Treatment/Exercise - 11/23/19 0001      Lumbar Exercises: Stretches   Active Hamstring Stretch Limitations  shown in sitting  2x30 sec bilateral     Piriformis Stretch Limitations  3x20 sec hold       Lumbar Exercises: Standing   Row Limitations  x20 green with cuing for breathing       Lumbar Exercises: Supine   AB Set Limitations  reviewed abdominal  breathing but will require further instruction     Clam Limitations  x20 green with cuing for breathing     Bent Knee Raise Limitations  supine march with abdominal breathing x20 each leg     Bridge Limitations  x20       Manual Therapy   Manual Therapy  Joint mobilization;Manual Traction;Soft tissue mobilization    Joint Mobilization  PA glides to L3-L5  Soft tissue mobilization  to QL and lumbar praspinals    Manual Traction  bilateral 3x 1 min each leg              PT Education - 11/22/19 1625    Education Details  reviewed the benefits of therapy    Person(s) Educated  Patient    Methods  Explanation;Verbal cues;Demonstration;Tactile cues    Comprehension  Verbalized understanding;Returned demonstration;Verbal cues required;Tactile cues required       PT Short Term Goals - 11/08/19 0914      PT SHORT TERM GOAL #1   Title  pt to be I with inital HEP    Time  3    Period  Weeks    Status  New    Target Date  11/29/19        PT Long Term Goals - 11/08/19 0929      PT LONG TERM GOAL #1   Title  pt to verbalize/ demo efficient posture and lifting mechanics to reduce and prevent low back and referred LE pain.    Time  6    Period  Weeks    Status  New    Target Date  12/20/19      PT LONG TERM GOAL #2   Title  increase hip abductor/ extensor strength to >/= 4+/5 to promote efficient lifting mechanics    Time  6    Period  Weeks    Status  New    Target Date  12/20/19      PT LONG TERM GOAL #3    Title  pt be able to walk/ stand >/= 45 min and navigate up/ down >/= 12 steps reciprocally with </= 2/10 referred pain for functional mobility    Time  6    Period  Weeks    Status  New    Target Date  12/20/19      PT LONG TERM GOAL #4   Title  increase FOTO score to </= 37% limited to demo improvement in function    Time  6    Period  Weeks    Status  New    Target Date  12/20/19      PT LONG TERM GOAL #5   Title  pt to be I with all HEP given  to maintain and progress current level of function    Time  6    Period  Weeks    Status  New    Target Date  12/20/19            Plan - 11/23/19 0751    Clinical Impression Statement  It was difficult to tell through patients description of symptoms if he is having centraliztion of lower back pain or just more intense symptoms in other areas. Therapy focused on manual therapy to reduce symptoms and comporession on the spine. He does not have any other visits scheduled. He was concerned that his pain is not better. he was Lynnell Grain he has only had 1 treatment. He reports he will schedule 1 more visit. He was advised to look for some improvement either in intensity of pain, frequency, or how far it is radiating down his leg. He voiced understanding but there is some concernt that the langauge barrier may ean issue with education.    Personal Factors and Comorbidities  Comorbidity 1;Age    Comorbidities  hx of DM    Examination-Activity Limitations  Stand;Locomotion Level;Stairs    Stability/Clinical Decision Making  Evolving/Moderate complexity    Clinical Decision Making  Moderate    Rehab Potential  Good    PT Frequency  2x / week    PT Duration  6 weeks    PT Treatment/Interventions  ADLs/Self Care Home Management;Cryotherapy;Electrical Stimulation;Iontophoresis 4mg /ml Dexamethasone;Moist Heat;Ultrasound;Traction;Therapeutic activities;Therapeutic exercise;Balance training;Neuromuscular re-education;Manual techniques;Dry  needling;Taping;Passive range of motion;Spinal Manipulations;Patient/family education    PT Next Visit Plan  review/ update HEP PRN, provide pt FOTO handout, flexion biased exercises, hip flexor stretching, hip abductor/ extensor strengthening.    PT Home Exercise Plan  [id number] -  hip flexor stretching, LTR, single knee to chest stretch, supine marching    Consulted and Agree with Plan of Care  Patient       Patient will benefit from skilled therapeutic intervention in order to improve the following deficits and impairments:  Improper body mechanics, Increased muscle spasms, Decreased strength, Abnormal gait, Pain, Decreased activity tolerance, Decreased balance, Decreased endurance, Postural dysfunction  Visit Diagnosis: Chronic low back pain, unspecified back pain laterality, unspecified whether sciatica present  Other abnormalities of gait and mobility     Problem List Patient Active Problem List   Diagnosis Date Noted  . Hypertension   . Chronic kidney disease   . Hyperthyroidism   . Diabetes mellitus without complication ([place])   . Renal calculi   . Type II or unspecified type diabetes mellitus without mention of complication, not stated as uncontrolled 11/27/2012    Carney Living PT DPT  11/23/2019, 7:55 AM  [facility] [address] [place], [place], [address] Phone: [phone]   Fax:  [phone]  Name: Ryan Ferguson MRN: [id number] Date of Birth: [birth year]

## 2019-12-19 ENCOUNTER — Ambulatory Visit: Payer: HMO | Admitting: Physical Therapy

## 2019-12-19 ENCOUNTER — Other Ambulatory Visit: Payer: Self-pay

## 2019-12-19 DIAGNOSIS — M545 Low back pain, unspecified: Secondary | ICD-10-CM

## 2019-12-19 DIAGNOSIS — R2689 Other abnormalities of gait and mobility: Secondary | ICD-10-CM

## 2019-12-19 NOTE — Therapy (Signed)
[place], [place], [address] Phone: [phone]   Fax:  [phone]  Physical Therapy Treatment / Discharge  Patient Details  Name: Ryan Ferguson MRN: [id number] Date of Birth: 1951-04-20 Referring Provider (PT): Vernie Shanks, MD    Encounter Date: 12/19/2019   PT End of Session - 12/19/19 0757    Visit Number 3    Number of Visits 13    Date for PT Re-Evaluation 12/20/19    Authorization Type healthteam advantage: Kx mod at 15th visit    Progress Note Due on Visit 10    PT Start Time 0757    PT Stop Time 0835    PT Time Calculation (min) 38 min    Activity Tolerance Patient tolerated treatment well    Behavior During Therapy [facility] for tasks assessed/performed           Past Medical History:  Diagnosis Date  . Chronic kidney disease   . Diabetes mellitus without complication (HCC)    does not check cbg daily  . Hypertension   . Hyperthyroidism   . Renal calculi     Past Surgical History:  Procedure Laterality Date  . APPENDECTOMY    . COLON SURGERY    . CYSTOSCOPY    . LITHOTRIPSY     twice  . LITHOTRIPSY      There were no vitals filed for this visit.   Subjective Assessment - 12/19/19 0758    Subjective "I've been doing the exercises. on Sunday I was drilling hole in the ground on my property and I felt pain in inthe back that came up my legs to the right  low back. I have no pain today but some thing doesn't feel right.    Currently in Pain? Yes    Pain Score 0-No pain    Pain Orientation Right              [facility] PT Assessment - 12/19/19 0001      Assessment   Medical Diagnosis Radiculopathy, lumbar region M54.16    Referring Provider (PT) Vernie Shanks, MD       Observation/Other Assessments   Focus on Therapeutic Outcomes (FOTO)  35% limited      Strength   Right Hip Flexion 4+/5    Right Hip Extension 4+/5    Right Hip ABduction 4+/5    Right Hip ADduction  5/5    Left Hip Flexion 4+/5    Left Hip Extension 4+/5    Left Hip ABduction 4+/5    Left Hip ADduction 5/5    Right Knee Flexion 5/5    Right Knee Extension 5/5    Left Knee Flexion 5/5    Left Knee Extension 5/5                         [facility] Adult PT Treatment/Exercise - 12/19/19 0001      Self-Care   Self-Care Other Self-Care Comments;Posture    Posture maintaining efficient posture while performing  lifting using hips/ knees versus only the back.    Other Self-Care Comments  how to perform self trigger point release along the quadratus lumborum      Lumbar Exercises: Seated   Sit to Stand --   2 x 10 reps with arms crossed     Manual Therapy   Manual therapy comments MTPR along the L QL x 3    Joint Mobilization PA  glides to L3-L5                   PT Education - 12/19/19 0839    Education Details Reviewed and updated HEP today for sit to stand strengtheing and functional lifting mechanics. reviewed how to maintain and progress strength with increased reps/ sets and resistance. reivewed final assessment/ FOTO reassessment.    Person(s) Educated Patient    Methods Explanation;Verbal cues;Handout    Comprehension Verbalized understanding;Verbal cues required            PT Short Term Goals - 12/19/19 0837      PT SHORT TERM GOAL #1   Title pt to be I with inital HEP    Period Weeks    Status Achieved             PT Long Term Goals - 12/19/19 8502      PT LONG TERM GOAL #1   Title pt to verbalize/ demo efficient posture and lifting mechanics to reduce and prevent low back and referred LE pain.    Period Weeks    Status Achieved      PT LONG TERM GOAL #2   Title increase hip abductor/ extensor strength to >/= 4+/5 to promote efficient lifting mechanics    Period Weeks    Status Achieved      PT LONG TERM GOAL #3   Title pt be able to walk/ stand >/= 45 min and navigate up/ down >/= 12 steps reciprocally with </= 2/10 referred pain  for functional mobility    Period Weeks    Status Achieved      PT LONG TERM GOAL #4   Title increase FOTO score to </= 37% limited to demo improvement in function    Period Weeks    Status Achieved      PT LONG TERM GOAL #5   Title pt to be I with all HEP given  to maintain and progress current level of function    Status Achieved                 Plan - 12/19/19 0840    Clinical Impression Statement Ryan Ferguson has made great progress with physical theray increased gross LE strength and additionally reported reduce pain/ soreness in the back and down bil LE. 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 He reported decreaesd pain following trigger point release and QL stretching. reivewed lifting mechancis and updated HEP today. He met all goals today and is able to maintain and progress his current level of function independently and will be discharged from PT today.    PT Treatment/Interventions ADLs/Self Care Home Management;Cryotherapy;Electrical Stimulation;Iontophoresis '4mg'$ /ml Dexamethasone;Moist Heat;Ultrasound;Traction;Therapeutic activities;Therapeutic exercise;Balance training;Neuromuscular re-education;Manual techniques;Dry needling;Taping;Passive range of motion;Spinal Manipulations;Patient/family education    PT Next Visit Plan D/C    PT Home Exercise Plan [id number] -  hip flexor stretching, LTR, single knee to chest stretch, supine marching, sit to stand, and proper lifting mechanics    Consulted and Agree with Plan of Care Patient           Patient will benefit from skilled therapeutic intervention in order to improve the following deficits and impairments:  Improper body mechanics, Increased muscle spasms, Decreased strength, Abnormal gait, Pain, Decreased activity tolerance, Decreased balance, Decreased endurance, Postural dysfunction  Visit Diagnosis: Chronic low back pain,  unspecified back pain laterality, unspecified whether sciatica  present  Other abnormalities of gait and mobility     Problem List Patient Active Problem List   Diagnosis Date Noted  . Hypertension   . Chronic kidney disease   . Hyperthyroidism   . Diabetes mellitus without complication ([place])   . Renal calculi   . Type II or unspecified type diabetes mellitus without mention of complication, not stated as uncontrolled 11/27/2012    Ryan Ferguson 12/19/2019, 8:43 AM  [facility] [address] [place], [place], [address] Phone: [phone]   Fax:  [phone]  Name: Ryan Ferguson MRN: [id number] Date of Birth: Nov 21, 1950       PHYSICAL THERAPY DISCHARGE SUMMARY  Visits from Start of Care: 3  Current functional level related to goals / functional outcomes: See goals, FOTO 35% limited   Remaining deficits: See assessment in note   Education / Equipment: HEP, theraband, posture/ lifting mechanics, anatomy,   Plan: Patient agrees to discharge.  Patient goals were met. Patient is being discharged due to meeting the stated rehab goals.  ?????         Kloey Cazarez PT, DPT, LAT, ATC  12/19/19  8:44 AM

## 2020-01-03 DIAGNOSIS — E039 Hypothyroidism, unspecified: Secondary | ICD-10-CM | POA: Diagnosis not present

## 2020-01-03 DIAGNOSIS — E119 Type 2 diabetes mellitus without complications: Secondary | ICD-10-CM | POA: Diagnosis not present

## 2020-01-21 DIAGNOSIS — M5416 Radiculopathy, lumbar region: Secondary | ICD-10-CM | POA: Diagnosis not present

## 2020-01-21 DIAGNOSIS — R29898 Other symptoms and signs involving the musculoskeletal system: Secondary | ICD-10-CM | POA: Diagnosis not present

## 2020-02-04 DIAGNOSIS — M48062 Spinal stenosis, lumbar region with neurogenic claudication: Secondary | ICD-10-CM | POA: Diagnosis not present

## 2020-02-04 DIAGNOSIS — M545 Low back pain: Secondary | ICD-10-CM | POA: Diagnosis not present

## 2020-02-04 DIAGNOSIS — M5416 Radiculopathy, lumbar region: Secondary | ICD-10-CM | POA: Diagnosis not present

## 2020-02-08 DIAGNOSIS — M545 Low back pain: Secondary | ICD-10-CM | POA: Diagnosis not present

## 2020-02-19 DIAGNOSIS — I10 Essential (primary) hypertension: Secondary | ICD-10-CM | POA: Diagnosis not present

## 2020-02-19 DIAGNOSIS — E1165 Type 2 diabetes mellitus with hyperglycemia: Secondary | ICD-10-CM | POA: Diagnosis not present

## 2020-02-19 DIAGNOSIS — Z7984 Long term (current) use of oral hypoglycemic drugs: Secondary | ICD-10-CM | POA: Diagnosis not present

## 2020-02-19 DIAGNOSIS — R29898 Other symptoms and signs involving the musculoskeletal system: Secondary | ICD-10-CM | POA: Diagnosis not present

## 2020-02-19 DIAGNOSIS — M5416 Radiculopathy, lumbar region: Secondary | ICD-10-CM | POA: Diagnosis not present

## 2020-02-22 DIAGNOSIS — M48061 Spinal stenosis, lumbar region without neurogenic claudication: Secondary | ICD-10-CM | POA: Diagnosis not present

## 2020-02-22 DIAGNOSIS — M6281 Muscle weakness (generalized): Secondary | ICD-10-CM | POA: Diagnosis not present

## 2020-02-27 DIAGNOSIS — M48061 Spinal stenosis, lumbar region without neurogenic claudication: Secondary | ICD-10-CM | POA: Diagnosis not present

## 2020-02-27 DIAGNOSIS — M6281 Muscle weakness (generalized): Secondary | ICD-10-CM | POA: Diagnosis not present

## 2020-03-04 DIAGNOSIS — M6281 Muscle weakness (generalized): Secondary | ICD-10-CM | POA: Diagnosis not present

## 2020-03-04 DIAGNOSIS — M48061 Spinal stenosis, lumbar region without neurogenic claudication: Secondary | ICD-10-CM | POA: Diagnosis not present

## 2020-03-07 DIAGNOSIS — M48061 Spinal stenosis, lumbar region without neurogenic claudication: Secondary | ICD-10-CM | POA: Diagnosis not present

## 2020-03-07 DIAGNOSIS — M6281 Muscle weakness (generalized): Secondary | ICD-10-CM | POA: Diagnosis not present

## 2020-03-26 ENCOUNTER — Encounter: Payer: Self-pay | Admitting: Internal Medicine

## 2020-03-26 ENCOUNTER — Ambulatory Visit (INDEPENDENT_AMBULATORY_CARE_PROVIDER_SITE_OTHER): Payer: HMO | Admitting: Internal Medicine

## 2020-03-26 ENCOUNTER — Other Ambulatory Visit: Payer: Self-pay

## 2020-03-26 VITALS — BP 160/98 | HR 64 | Temp 99.0°F | Ht 65.0 in | Wt 143.0 lb

## 2020-03-26 DIAGNOSIS — H6120 Impacted cerumen, unspecified ear: Secondary | ICD-10-CM | POA: Insufficient documentation

## 2020-03-26 DIAGNOSIS — E034 Atrophy of thyroid (acquired): Secondary | ICD-10-CM | POA: Diagnosis not present

## 2020-03-26 DIAGNOSIS — E119 Type 2 diabetes mellitus without complications: Secondary | ICD-10-CM

## 2020-03-26 DIAGNOSIS — I2510 Atherosclerotic heart disease of native coronary artery without angina pectoris: Secondary | ICD-10-CM

## 2020-03-26 DIAGNOSIS — I2583 Coronary atherosclerosis due to lipid rich plaque: Secondary | ICD-10-CM

## 2020-03-26 DIAGNOSIS — N1831 Chronic kidney disease, stage 3a: Secondary | ICD-10-CM

## 2020-03-26 DIAGNOSIS — I10 Essential (primary) hypertension: Secondary | ICD-10-CM

## 2020-03-26 DIAGNOSIS — E1159 Type 2 diabetes mellitus with other circulatory complications: Secondary | ICD-10-CM | POA: Insufficient documentation

## 2020-03-26 DIAGNOSIS — H6123 Impacted cerumen, bilateral: Secondary | ICD-10-CM

## 2020-03-26 LAB — URINALYSIS
Bilirubin Urine: NEGATIVE
Hgb urine dipstick: NEGATIVE
Ketones, ur: NEGATIVE
Leukocytes,Ua: NEGATIVE
Nitrite: NEGATIVE
Specific Gravity, Urine: 1.02 (ref 1.000–1.030)
Total Protein, Urine: NEGATIVE
Urine Glucose: 100 — AB
Urobilinogen, UA: 0.2 (ref 0.0–1.0)
pH: 6.5 (ref 5.0–8.0)

## 2020-03-26 LAB — CBC WITH DIFFERENTIAL/PLATELET
Basophils Absolute: 0 10*3/uL (ref 0.0–0.1)
Basophils Relative: 0.9 % (ref 0.0–3.0)
Eosinophils Absolute: 0.1 10*3/uL (ref 0.0–0.7)
Eosinophils Relative: 2.6 % (ref 0.0–5.0)
HCT: 41.6 % (ref 39.0–52.0)
Hemoglobin: 14.2 g/dL (ref 13.0–17.0)
Lymphocytes Relative: 37 % (ref 12.0–46.0)
Lymphs Abs: 2 10*3/uL (ref 0.7–4.0)
MCHC: 34 g/dL (ref 30.0–36.0)
MCV: 91.3 fl (ref 78.0–100.0)
Monocytes Absolute: 0.4 10*3/uL (ref 0.1–1.0)
Monocytes Relative: 7.5 % (ref 3.0–12.0)
Neutro Abs: 2.8 10*3/uL (ref 1.4–7.7)
Neutrophils Relative %: 52 % (ref 43.0–77.0)
Platelets: 217 10*3/uL (ref 150.0–400.0)
RBC: 4.56 Mil/uL (ref 4.22–5.81)
RDW: 13.6 % (ref 11.5–15.5)
WBC: 5.4 10*3/uL (ref 4.0–10.5)

## 2020-03-26 LAB — COMPREHENSIVE METABOLIC PANEL
ALT: 15 U/L (ref 0–53)
AST: 12 U/L (ref 0–37)
Albumin: 4.4 g/dL (ref 3.5–5.2)
Alkaline Phosphatase: 54 U/L (ref 39–117)
BUN: 25 mg/dL — ABNORMAL HIGH (ref 6–23)
CO2: 31 mEq/L (ref 19–32)
Calcium: 10.1 mg/dL (ref 8.4–10.5)
Chloride: 96 mEq/L (ref 96–112)
Creatinine, Ser: 1.07 mg/dL (ref 0.40–1.50)
GFR: 70.15 mL/min (ref >60.00)
Glucose, Bld: 230 mg/dL — ABNORMAL HIGH (ref 70–99)
Potassium: 4 mEq/L (ref 3.5–5.1)
Sodium: 135 mEq/L (ref 135–145)
Total Bilirubin: 0.4 mg/dL (ref 0.2–1.2)
Total Protein: 6.9 g/dL (ref 6.0–8.3)

## 2020-03-26 LAB — TSH: TSH: 0.88 u[IU]/mL (ref 0.35–4.50)

## 2020-03-26 LAB — MICROALBUMIN / CREATININE URINE RATIO
Creatinine,U: 73.4 mg/dL
Microalb Creat Ratio: 3.1 mg/g (ref 0.0–30.0)
Microalb, Ur: 2.3 mg/dL — ABNORMAL HIGH (ref 0.0–1.9)

## 2020-03-26 LAB — VITAMIN B12: Vitamin B-12: 276 pg/mL (ref 211–911)

## 2020-03-26 LAB — LIPID PANEL
Cholesterol: 197 mg/dL (ref 0–200)
HDL: 52.7 mg/dL (ref >39.00)
LDL Cholesterol: 120 mg/dL — ABNORMAL HIGH (ref 0–99)
NonHDL: 143.91
Total CHOL/HDL Ratio: 4
Triglycerides: 120 mg/dL (ref 0.0–149.0)
VLDL: 24 mg/dL (ref 0.0–40.0)

## 2020-03-26 LAB — HEMOGLOBIN A1C: Hgb A1c MFr Bld: 7.3 % — ABNORMAL HIGH (ref 4.6–6.5)

## 2020-03-26 LAB — CK: Total CK: 47 U/L (ref 7–232)

## 2020-03-26 LAB — VITAMIN D 25 HYDROXY (VIT D DEFICIENCY, FRACTURES): VITD: 30.74 ng/mL (ref 30.00–100.00)

## 2020-03-26 LAB — PSA: PSA: 2.17 ng/mL (ref 0.10–4.00)

## 2020-03-26 MED ORDER — OLMESARTAN MEDOXOMIL-HCTZ 40-12.5 MG PO TABS: 1.0000 | ORAL_TABLET | Freq: Every day | ORAL | 3 refills | Status: DC

## 2020-03-26 NOTE — Assessment & Plan Note (Signed)
2020 Dr Johnsie Cancel Coronary calcium score of 1031. This was 88% percentile for age and sex matched control. On Lipitor, asa

## 2020-03-26 NOTE — Assessment & Plan Note (Signed)
Needs to clean at home

## 2020-03-26 NOTE — Assessment & Plan Note (Signed)
D/c Lisinopril, HCTZ Benicar HCT

## 2020-03-26 NOTE — Assessment & Plan Note (Signed)
Does not check cbg daily

## 2020-03-26 NOTE — Progress Notes (Signed)
Subjective:  Patient ID: Ryan Ferguson, male    DOB: 1950/11/08  Age: 69 y.o. MRN: [id number]  CC: New Patient (Initial Visit)   HPI Ryan Ferguson presents for a new pt visit C/o DM, HTN He is complaining of chronic cough and congestion C/o wt loss 20-30 lbs after Metformin was increased He is complaining of leg weakness, myalgia, shuffling gait which appeared to start when he is started his Lipitor  Outpatient Medications Prior to Visit  Medication Sig Dispense Refill  . aspirin EC 81 MG tablet Take 1 tablet (81 mg total) by mouth daily. 90 tablet 3  . atorvastatin (LIPITOR) 40 MG tablet Take 40 mg by mouth daily.    . cholecalciferol (VITAMIN D) 1000 UNITS tablet Take 1,000 Units by mouth daily as needed (nutrition).     . Chromium 200 MCG CAPS Take 1 capsule by mouth daily.    . hydrochlorothiazide (MICROZIDE) 12.5 MG capsule Take 12.5 mg by mouth daily.    Marland Kitchen levothyroxine (SYNTHROID) 112 MCG tablet Take 112 mcg by mouth every morning.    Marland Kitchen lisinopril (ZESTRIL) 40 MG tablet Take 40 mg by mouth daily.    . metFORMIN (GLUCOPHAGE) 1000 MG tablet Take 1,000 mg by mouth 2 (two) times daily.    . ONE TOUCH ULTRA TEST test strip USE TEST STRIPS TO TEST BLOOD SUGAR ONCE DAILY  4   No facility-administered medications prior to visit.    ROS: Review of Systems  Constitutional: Negative for appetite change, fatigue and unexpected weight change.  HENT: Positive for congestion. Negative for nosebleeds, sneezing, sore throat and trouble swallowing.   Eyes: Negative for itching and visual disturbance.  Respiratory: Positive for cough.   Cardiovascular: Negative for chest pain, palpitations and leg swelling.  Gastrointestinal: Negative for abdominal distention, blood in stool, diarrhea and nausea.  Genitourinary: Negative for frequency and hematuria.  Musculoskeletal: Positive for arthralgias, back pain, gait problem and myalgias. Negative for joint swelling and neck pain.  Skin:  Negative for rash.  Neurological: Positive for weakness. Negative for dizziness, tremors and speech difficulty.  Psychiatric/Behavioral: Negative for agitation, dysphoric mood and sleep disturbance. The patient is not nervous/anxious.     Objective:  BP (!) 160/98 (BP Location: Left Arm, Patient Position: Sitting, Cuff Size: Normal)   Pulse 64   Temp 99 F (37.2 C) (Oral)   Ht 5\' 5"  (1.651 m)   Wt 143 lb (64.9 kg)   SpO2 97%   BMI 23.80 kg/m   BP Readings from Last 3 Encounters:  03/26/20 (!) 160/98  05/24/19 (!) 148/70  03/25/17 121/74    Wt Readings from Last 3 Encounters:  03/26/20 143 lb (64.9 kg)  06/29/19 149 lb (67.6 kg)  05/24/19 149 lb (67.6 kg)    Physical Exam Constitutional:      General: He is not in acute distress.    Appearance: Normal appearance. He is well-developed.     Comments: NAD  Eyes:     Conjunctiva/sclera: Conjunctivae normal.     Pupils: Pupils are equal, round, and reactive to light.  Neck:     Thyroid: No thyromegaly.     Vascular: No JVD.  Cardiovascular:     Rate and Rhythm: Normal rate and regular rhythm.     Heart sounds: Normal heart sounds. No murmur heard.  No friction rub. No gallop.   Pulmonary:     Effort: Pulmonary effort is normal. No respiratory distress.     Breath sounds: Normal breath  sounds. No wheezing or rales.  Chest:     Chest wall: No tenderness.  Abdominal:     General: Bowel sounds are normal. There is no distension.     Palpations: Abdomen is soft. There is no mass.     Tenderness: There is no abdominal tenderness. There is no guarding or rebound.  Musculoskeletal:        General: Tenderness present. Normal range of motion.     Cervical back: Normal range of motion.  Lymphadenopathy:     Cervical: No cervical adenopathy.  Skin:    General: Skin is warm and dry.     Findings: No rash.  Neurological:     Mental Status: He is alert and oriented to person, place, and time.     Cranial Nerves: No cranial  nerve deficit.     Motor: No abnormal muscle tone.     Coordination: Coordination normal.     Gait: Gait abnormal.     Deep Tendon Reflexes: Reflexes are normal and symmetric.  Psychiatric:        Behavior: Behavior normal.        Thought Content: Thought content normal.        Judgment: Judgment normal.   Somewhat shuffling gait; lower extremity muscles are tender to palpation     A total time of >60 minutes was spent preparing to see the new patient, reviewing tests, x-rays, operative reports and outside records.  Also, obtaining history and performing comprehensive physical exam.  Additionally, counseling the patient regarding the above listed issues.   Finally, documenting clinical information in the health records, coordination of care, educating the patient. It is a complex case.  We discussed potential statin and ACE inhibitor side effects.  We discussed the change in medications and future strategies.   Lab Results  Component Value Date   WBC 7.0 03/25/2017   HGB 13.8 03/25/2017   HCT 40.5 03/25/2017   PLT 232 03/25/2017   GLUCOSE 130 (H) 03/25/2017   CHOL 193 07/09/2013   TRIG 154 (H) 07/09/2013   HDL 44 07/09/2013   LDLCALC 118 (H) 07/09/2013   ALT 36 03/01/2016   AST 25 03/01/2016   NA 137 03/25/2017   K 3.9 03/25/2017   CL 97 (L) 03/25/2017   CREATININE 1.16 03/25/2017   BUN 39 (H) 03/25/2017   CO2 22 03/25/2017   TSH 2.471 07/09/2013   PSA 2.49 07/09/2013   HGBA1C 7.5 (H) 07/09/2013    MR ABDOMEN WWO CONTRAST  Result Date: 07/13/2019 CLINICAL DATA:  Follow-up kidney lesion EXAM: MRI ABDOMEN WITHOUT AND WITH CONTRAST TECHNIQUE: Multiplanar multisequence MR imaging of the abdomen was performed both before and after the administration of intravenous contrast. CONTRAST:  58mL GADAVIST GADOBUTROL 1 MMOL/ML IV SOLN COMPARISON:  CT AP 06/11/2019 FINDINGS: Lower chest: No acute findings. Hepatobiliary: There is no suspicious liver abnormality identified. No abnormal  enhancement. The gallbladder is normal. No gallstones or gallbladder wall thickening. No biliary dilatation. Pancreas: No mass, inflammatory changes, or other parenchymal abnormality identified. Spleen:  Within normal limits in size and appearance. Adrenals/Urinary Tract:  Normal adrenal glands. Small cyst within anterior cortex of right lower pole measures 9 mm. Three adjacent cysts are identified within the upper pole of left kidney. The largest measures 3.5 cm, image 16/6. Corresponding to the complex lesion identified on CT is a well-circumscribed cortical based, lateral upper pole lesion which is T1 isointense and T2 hyperintense. There is no measurable enhancement within this lesion between the  pre and post-contrast images. On the subtraction images no enhancement is identified within this structure. This is compatible with a Bosniak category 2 lesion. Several small cystic lesions within the inferior pole of left kidney are too small to reliably characterize but also likely represent Bosniak category 1 lesions. No solid enhancing kidney lesions identified at this time. No hydronephrosis. Stomach/Bowel: Visualized portions within the abdomen are unremarkable. Vascular/Lymphatic: Aortic atherosclerosis. No abdominopelvic adenopathy identified. Other:  No free fluid or fluid collections Musculoskeletal: No suspicious bone lesions identified. IMPRESSION: 1. Bilateral kidney cysts are identified compatible with Bosniak category 1 and 2 kidney cysts. The mildly complex lesion within the lateral cortex of the left kidney does not exhibit enhancement postcontrast and is compatible with a Bosniak category 2 lesion. 2.  Aortic Atherosclerosis (ICD10-I70.0). Electronically Signed   By: Kerby Moors M.D.   On: 07/13/2019 08:48    Assessment & Plan:   There are no diagnoses linked to this encounter.   No orders of the defined types were placed in this encounter.    Follow-up: No follow-ups on file.  Ryan Kehr, MD

## 2020-03-26 NOTE — Patient Instructions (Signed)
Hold Atorvastatin x 1 months  Stop Lisinopril, HCTZ

## 2020-03-26 NOTE — Assessment & Plan Note (Addendum)
?  remote Labs

## 2020-03-28 ENCOUNTER — Encounter: Payer: Self-pay | Admitting: Internal Medicine

## 2020-03-28 DIAGNOSIS — E034 Atrophy of thyroid (acquired): Secondary | ICD-10-CM | POA: Insufficient documentation

## 2020-04-09 ENCOUNTER — Other Ambulatory Visit: Payer: Self-pay | Admitting: Internal Medicine

## 2020-04-09 MED ORDER — LEVOTHYROXINE SODIUM 112 MCG PO TABS: 112.0000 ug | ORAL_TABLET | Freq: Every morning | ORAL | 1 refills | Status: DC

## 2020-04-09 MED ORDER — METFORMIN HCL 1000 MG PO TABS: 1000.0000 mg | ORAL_TABLET | Freq: Two times a day (BID) | ORAL | 1 refills | Status: DC

## 2020-04-09 NOTE — Telephone Encounter (Signed)
levothyroxine (SYNTHROID) 112 MCG tablet metFORMIN (GLUCOPHAGE) 1000 MG tablet [place], [place] Phone:  [phone]  Fax:  [phone]     Patient requesting a refill on medications

## 2020-04-09 NOTE — Telephone Encounter (Signed)
Reviewed chart pt is up-to-date sent refills to [facility].../lmb  

## 2020-04-22 ENCOUNTER — Other Ambulatory Visit: Payer: Self-pay

## 2020-04-22 NOTE — Patient Outreach (Signed)
  [facility]) Care Management Chronic Special Needs Program    04/22/2020  Name: Ryan Ferguson, DOB: 03-26-1951  MRN: [id number]   Mr. Ryan Ferguson is enrolled in a chronic special needs plan for Diabetes.  [facility] will continue to provide services for this client through 06/20/2020. [facility] care management team will assume care 06/21/2020.  Peter Garter RN, Jackquline Denmark, CDE Chronic Care Management Coordinator [facility] Care Management [phone]

## 2020-05-07 ENCOUNTER — Ambulatory Visit (INDEPENDENT_AMBULATORY_CARE_PROVIDER_SITE_OTHER): Payer: HMO

## 2020-05-07 DIAGNOSIS — Z0000 Encounter for general adult medical examination without abnormal findings: Secondary | ICD-10-CM | POA: Diagnosis not present

## 2020-05-07 NOTE — Patient Instructions (Signed)
Ryan Ferguson , Thank you for taking time to come for your Medicare Wellness Visit. I appreciate your ongoing commitment to your health goals. Please review the following plan we discussed and let me know if I can assist you in the future.   Screening recommendations/referrals: Colonoscopy: 09/09/2016; due every 7 years (per patient) Recommended yearly ophthalmology/optometry visit for glaucoma screening and checkup Recommended yearly dental visit for hygiene and checkup  Vaccinations: Influenza vaccine: declined Pneumococcal vaccine: declined Tdap vaccine: 07/09/2013; due every 10 years (2025) Shingles vaccine: declined   Covid-19: up to date  Advanced directives: Advance directive discussed with you today. Even though you declined this today please call our office should you change your mind and we can give you the proper paperwork for you to fill out.  Conditions/risks identified: Yes; Reviewed health maintenance screenings with patient today and relevant education, vaccines, and/or referrals were provided. Please continue to do your personal lifestyle choices by: daily care of teeth and gums, regular physical activity (goal should be 5 days a week for 30 minutes), eat a healthy diet, avoid tobacco and drug use, limiting any alcohol intake, taking a low-dose aspirin (if not allergic or have been advised by your provider otherwise) and taking vitamins and minerals as recommended by your provider. Continue doing brain stimulating activities (puzzles, reading, adult coloring books, staying active) to keep memory sharp. Continue to eat heart healthy diet (full of fruits, vegetables, whole grains, lean protein, water--limit salt, fat, and sugar intake) and increase physical activity as tolerated.  Next appointment: Please schedule your next Medicare Wellness Visit with your Nurse Health Advisor in 1 year by calling [phone].  Preventive Care 50 Years and Older, Male Preventive care refers to  lifestyle choices and visits with your health care provider that can promote health and wellness. What does preventive care include?  A yearly physical exam. This is also called an annual well check.  Dental exams once or twice a year.  Routine eye exams. Ask your health care provider how often you should have your eyes checked.  Personal lifestyle choices, including:  Daily care of your teeth and gums.  Regular physical activity.  Eating a healthy diet.  Avoiding tobacco and drug use.  Limiting alcohol use.  Practicing safe sex.  Taking low doses of aspirin every day.  Taking vitamin and mineral supplements as recommended by your health care provider. What happens during an annual well check? The services and screenings done by your health care provider during your annual well check will depend on your age, overall health, lifestyle risk factors, and family history of disease. Counseling  Your health care provider may ask you questions about your:  Alcohol use.  Tobacco use.  Drug use.  Emotional well-being.  Home and relationship well-being.  Sexual activity.  Eating habits.  History of falls.  Memory and ability to understand (cognition).  Work and work Statistician. Screening  You may have the following tests or measurements:  Height, weight, and BMI.  Blood pressure.  Lipid and cholesterol levels. These may be checked every 5 years, or more frequently if you are over 46 years old.  Skin check.  Lung cancer screening. You may have this screening every year starting at age 80 if you have a 30-pack-year history of smoking and currently smoke or have quit within the past 15 years.  Fecal occult blood test (FOBT) of the stool. You may have this test every year starting at age 57.  Flexible sigmoidoscopy or colonoscopy.  You may have a sigmoidoscopy every 5 years or a colonoscopy every 10 years starting at age 77.  Prostate cancer screening.  Recommendations will vary depending on your family history and other risks.  Hepatitis C blood test.  Hepatitis B blood test.  Sexually transmitted disease (STD) testing.  Diabetes screening. This is done by checking your blood sugar (glucose) after you have not eaten for a while (fasting). You may have this done every 1-3 years.  Abdominal aortic aneurysm (AAA) screening. You may need this if you are a current or former smoker.  Osteoporosis. You may be screened starting at age 36 if you are at high risk. Talk with your health care provider about your test results, treatment options, and if necessary, the need for more tests. Vaccines  Your health care provider may recommend certain vaccines, such as:  Influenza vaccine. This is recommended every year.  Tetanus, diphtheria, and acellular pertussis (Tdap, Td) vaccine. You may need a Td booster every 10 years.  Zoster vaccine. You may need this after age 68.  Pneumococcal 13-valent conjugate (PCV13) vaccine. One dose is recommended after age 54.  Pneumococcal polysaccharide (PPSV23) vaccine. One dose is recommended after age 38. Talk to your health care provider about which screenings and vaccines you need and how often you need them. This information is not intended to replace advice given to you by your health care provider. Make sure you discuss any questions you have with your health care provider. Document Released: 07/04/2015 Document Revised: 02/25/2016 Document Reviewed: 04/08/2015 Elsevier Interactive Patient Education  2017 [place] Prevention in the Home Falls can cause injuries. They can happen to people of all ages. There are many things you can do to make your home safe and to help prevent falls. What can I do on the outside of my home?  Regularly fix the edges of walkways and driveways and fix any cracks.  Remove anything that might make you trip as you walk through a door, such as a raised step or  threshold.  Trim any bushes or trees on the path to your home.  Use bright outdoor lighting.  Clear any walking paths of anything that might make someone trip, such as rocks or tools.  Regularly check to see if handrails are loose or broken. Make sure that both sides of any steps have handrails.  Any raised decks and porches should have guardrails on the edges.  Have any leaves, snow, or ice cleared regularly.  Use sand or salt on walking paths during winter.  Clean up any spills in your garage right away. This includes oil or grease spills. What can I do in the bathroom?  Use night lights.  Install grab bars by the toilet and in the tub and shower. Do not use towel bars as grab bars.  Use non-skid mats or decals in the tub or shower.  If you need to sit down in the shower, use a plastic, non-slip stool.  Keep the floor dry. Clean up any water that spills on the floor as soon as it happens.  Remove soap buildup in the tub or shower regularly.  Attach bath mats securely with double-sided non-slip rug tape.  Do not have throw rugs and other things on the floor that can make you trip. What can I do in the bedroom?  Use night lights.  Make sure that you have a light by your bed that is easy to reach.  Do not use any sheets  or blankets that are too big for your bed. They should not hang down onto the floor.  Have a firm chair that has side arms. You can use this for support while you get dressed.  Do not have throw rugs and other things on the floor that can make you trip. What can I do in the kitchen?  Clean up any spills right away.  Avoid walking on wet floors.  Keep items that you use a lot in easy-to-reach places.  If you need to reach something above you, use a strong step stool that has a grab bar.  Keep electrical cords out of the way.  Do not use floor polish or wax that makes floors slippery. If you must use wax, use non-skid floor wax.  Do not have  throw rugs and other things on the floor that can make you trip. What can I do with my stairs?  Do not leave any items on the stairs.  Make sure that there are handrails on both sides of the stairs and use them. Fix handrails that are broken or loose. Make sure that handrails are as long as the stairways.  Check any carpeting to make sure that it is firmly attached to the stairs. Fix any carpet that is loose or worn.  Avoid having throw rugs at the top or bottom of the stairs. If you do have throw rugs, attach them to the floor with carpet tape.  Make sure that you have a light switch at the top of the stairs and the bottom of the stairs. If you do not have them, ask someone to add them for you. What else can I do to help prevent falls?  Wear shoes that:  Do not have high heels.  Have rubber bottoms.  Are comfortable and fit you well.  Are closed at the toe. Do not wear sandals.  If you use a stepladder:  Make sure that it is fully opened. Do not climb a closed stepladder.  Make sure that both sides of the stepladder are locked into place.  Ask someone to hold it for you, if possible.  Clearly mark and make sure that you can see:  Any grab bars or handrails.  First and last steps.  Where the edge of each step is.  Use tools that help you move around (mobility aids) if they are needed. These include:  Canes.  Walkers.  Scooters.  Crutches.  Turn on the lights when you go into a dark area. Replace any light bulbs as soon as they burn out.  Set up your furniture so you have a clear path. Avoid moving your furniture around.  If any of your floors are uneven, fix them.  If there are any pets around you, be aware of where they are.  Review your medicines with your doctor. Some medicines can make you feel dizzy. This can increase your chance of falling. Ask your doctor what other things that you can do to help prevent falls. This information is not intended to  replace advice given to you by your health care provider. Make sure you discuss any questions you have with your health care provider. Document Released: 04/03/2009 Document Revised: 11/13/2015 Document Reviewed: 07/12/2014 Elsevier Interactive Patient Education  2017 [employer].

## 2020-05-07 NOTE — Progress Notes (Addendum)
I connected with Ryan Ferguson today by telephone and verified that I am speaking with the correct person using two identifiers. Location patient: home Location provider: work Persons participating in the virtual visit: Ryan Ferguson and [name], LPN.   I discussed the limitations, risks, security and privacy concerns of performing an evaluation and management service by telephone and the availability of in person appointments. I also discussed with the patient that there may be a patient responsible charge related to this service. The patient expressed understanding and verbally consented to this telephonic visit.    Interactive audio and video telecommunications were attempted between this provider and patient, however failed, due to patient having technical difficulties OR patient did not have access to video capability.  We continued and completed visit with audio only.  Some vital signs may be absent or patient reported.   Time Spent with patient on telephone encounter: 30 minutes  Subjective:   Ryan Ferguson is a 69 y.o. male who presents for Medicare Annual/Subsequent preventive examination.  Review of Systems    No ROS. Medicare Wellness Visit. Additional risk factors are reflected in social history. Cardiac Risk Factors include: advanced age (>10men, >55 women);diabetes mellitus;dyslipidemia;family history of premature cardiovascular disease;hypertension;male gender;smoking/ tobacco exposure Sleep Patterns: No sleep issues, feels rested on waking and sleeps 6-7 hours nightly. Home Safety/Smoke Alarms: Feels safe in home; uses home alarm. Smoke alarms in place. Living environment: [place] [facility]; Lives with spouse; no needs for DME; good support system. Seat Belt Safety/Bike Helmet: Wears seat belt.    Objective:    Today's Vitals   05/07/20 1448  PainSc: 6    There is no height or weight on file to calculate BMI.  Advanced Directives 05/07/2020 11/08/2019  05/08/2019 08/03/2018 04/29/2017 03/01/2016 06/05/2012  Does Patient Have a Medical Advance Directive? No No No No No No Patient does not have advance directive  Would patient like information on creating a medical advance directive? No - Patient declined Yes (MAU/Ambulatory/Procedural Areas - Information given) No - Patient declined No - Patient declined No - Patient declined No - patient declined information -  Pre-existing out of facility DNR order (yellow form or pink MOST form) - - - - - - -    Current Medications (verified) Outpatient Encounter Medications as of 05/07/2020  Medication Sig   aspirin EC 81 MG tablet Take 1 tablet (81 mg total) by mouth daily.   atorvastatin (LIPITOR) 40 MG tablet Take 40 mg by mouth daily.   cholecalciferol (VITAMIN D) 1000 UNITS tablet Take 1,000 Units by mouth daily as needed (nutrition).    Chromium 200 MCG CAPS Take 1 capsule by mouth daily.   levothyroxine (SYNTHROID) 112 MCG tablet Take 1 tablet (112 mcg total) by mouth every morning.   metFORMIN (GLUCOPHAGE) 1000 MG tablet Take 1 tablet (1,000 mg total) by mouth 2 (two) times daily.   olmesartan-hydrochlorothiazide (BENICAR HCT) 40-12.5 MG tablet Take 1 tablet by mouth daily.   ONE TOUCH ULTRA TEST test strip USE TEST STRIPS TO TEST BLOOD SUGAR ONCE DAILY   No facility-administered encounter medications on file as of 05/07/2020.    Allergies (verified) Lisinopril   History: Past Medical History:  Diagnosis Date   Chronic kidney disease    Diabetes mellitus without complication ([place])    does not check cbg daily   Hypertension    Hyperthyroidism    Renal calculi    Past Surgical History:  Procedure Laterality Date   APPENDECTOMY  COLON SURGERY     CYSTOSCOPY     LITHOTRIPSY     twice   LITHOTRIPSY     Family History  Problem Relation Age of Onset   Cancer Father        Pancreatic   High Cholesterol Father    Diabetes Maternal Grandmother    Cancer Maternal Grandfather         Lung   Heart disease Maternal Grandfather    Heart disease Paternal Grandmother    Social History   Socioeconomic History   Marital status: Married    Spouse name: Not on file   Number of children: Not on file   Years of education: Not on file   Highest education level: Not on file  Occupational History   Not on file  Tobacco Use   Smoking status: Current Every Day Smoker    Packs/day: 1.00   Smokeless tobacco: Never Used  Substance and Sexual Activity   Alcohol use: No   Drug use: No   Sexual activity: Not on file  Other Topics Concern   Not on file  Social History Narrative   Not on file   Social Determinants of Health   Financial Resource Strain: Low Risk    Difficulty of Paying Living Expenses: Not hard at all  Food Insecurity: No Food Insecurity   Worried About Charity fundraiser in the Last Year: Never true   Ran Out of Food in the Last Year: Never true  Transportation Needs: No Transportation Needs   Lack of Transportation (Medical): No   Lack of Transportation (Non-Medical): No  Physical Activity: Inactive   Days of Exercise per Week: 0 days   Minutes of Exercise per Session: 0 min  Stress: No Stress Concern Present   Feeling of Stress : Not at all  Social Connections: Unknown   Frequency of Communication with Friends and Family: More than three times a week   Frequency of Social Gatherings with Friends and Family: More than three times a week   Attends Religious Services: Patient refused   Marine scientist or Organizations: Patient refused   Attends Music therapist: Patient refused   Marital Status: Married    Tobacco Counseling Ready to quit: Not Answered Counseling given: Not Answered   Clinical Intake:  Pre-visit preparation completed: Yes  Pain : 0-10 Pain Score: 6  Pain Type: Chronic pain Pain Orientation: Lower Pain Radiating Towards: bilateral lower extremities Pain Descriptors / Indicators: Throbbing, Discomfort,  Shooting, Constant Pain Onset: More than a month ago Pain Frequency: Constant Pain Relieving Factors: Pain medication Effect of Pain on Daily Activities: Pain can diminish job performance, lower motivation to exercise, and prevent you from completing daily tasks.  Pain Relieving Factors: Pain medication  Nutritional Risks: None Diabetes: Yes CBG done?: No Did pt. bring in CBG monitor from home?: No  How often do you need to have someone help you when you read instructions, pamphlets, or other written materials from your doctor or pharmacy?: 1 - Never What is the last grade level you completed in school?: HSG  Diabetic? yes  Interpreter Needed?: No  Information entered by :: [name], LPN   Activities of Daily Living In your present state of health, do you have any difficulty performing the following activities: 05/07/2020 11/13/2019  Hearing? N N  Vision? N N  Difficulty concentrating or making decisions? N N  Walking or climbing stairs? N N  Dressing or bathing? N N  Doing errands, shopping? N N  Preparing Food and eating ? N N  Using the Toilet? N N  In the past six months, have you accidently leaked urine? N -  Do you have problems with loss of bowel control? N -  Managing your Medications? N N  Managing your Finances? N N  Housekeeping or managing your Housekeeping? N N  Some recent data might be hidden    Patient Care Team: Plotnikov, Evie Lacks, MD as PCP - General (Internal Medicine) Jerline Pain, MD as PCP - Cardiology (Cardiology) Dimitri Ped, RN as [place] [facility] Franchot Gallo, MD as Consulting Physician (Urology) Wilford Corner, MD as Consulting Physician (Gastroenterology)  Indicate any recent Medical Services you may have received from other than [facility] providers in the past year (date may be approximate).     Assessment:   This is a routine wellness examination for Ryan Ferguson.  Hearing/Vision screen No  exam data present  Dietary issues and exercise activities discussed: Current Exercise Habits: The patient has a physically strenuous job, but has no regular exercise apart from work., Exercise limited by: orthopedic condition(s)  Goals   None    Depression Screen PHQ 2/9 Scores 05/07/2020 11/13/2019 05/08/2019 04/29/2017  PHQ - 2 Score 0 0 0 0    Fall Risk Fall Risk  05/07/2020 03/26/2020 04/29/2017  Falls in the past year? 0 0 No  Number falls in past yr: 0 0 -  Injury with Fall? 0 0 -  Risk for fall due to : No Fall Risks - -  Follow up Falls evaluation completed - -    Any stairs in or around the home? Yes  If so, are there any without handrails? No  Home free of loose throw rugs in walkways, pet beds, electrical cords, etc? Yes  Adequate lighting in your home to reduce risk of falls? Yes   ASSISTIVE DEVICES UTILIZED TO PREVENT FALLS:  Life alert? No  Use of a cane, walker or w/c? No  Grab bars in the bathroom? No  Shower chair or bench in shower? No  Elevated toilet seat or a handicapped toilet? No   TIMED UP AND GO:  Was the test performed? No .  Length of time to ambulate 10 feet: 0 sec.   Gait steady and fast without use of assistive device  Cognitive Function: Patient is cogitatively intact.        Immunizations Immunization History  Administered Date(s) Administered   PFIZER SARS-COV-2 Vaccination 08/20/2019, 09/18/2019   Tdap 07/09/2013   Zoster 07/10/2013    TDAP status: Up to date  Flu Vaccine status: Declined, Education has been provided regarding the importance of this vaccine but patient still declined. Advised may receive this vaccine at local pharmacy or Health Dept. Aware to provide a copy of the vaccination record if obtained from local pharmacy or Health Dept. Verbalized acceptance and understanding. Pneumococcal vaccine status: Declined,  Education has been provided regarding the importance of this vaccine but patient still declined. Advised  may receive this vaccine at local pharmacy or Health Dept. Aware to provide a copy of the vaccination record if obtained from local pharmacy or Health Dept. Verbalized acceptance and understanding.  Covid-19 vaccine status: Completed vaccines  Qualifies for Shingles Vaccine? Yes   Zostavax completed Yes   Shingrix Completed?: No.    Education has been provided regarding the importance of this vaccine. Patient has been advised to call insurance company to determine out of pocket expense  if they have not yet received this vaccine. Advised may also receive vaccine at local pharmacy or Health Dept. Verbalized acceptance and understanding.  Screening Tests Health Maintenance  Topic Date Due   Hepatitis C Screening  Never done   FOOT EXAM  Never done   OPHTHALMOLOGY EXAM  Never done   COLONOSCOPY  Never done   PNA vac Low Risk Adult (1 of 2 - PCV13) Never done   INFLUENZA VACCINE  Never done   HEMOGLOBIN A1C  09/24/2020   TETANUS/TDAP  07/10/2023   COVID-19 Vaccine  Completed    Health Maintenance  Health Maintenance Due  Topic Date Due   Hepatitis C Screening  Never done   FOOT EXAM  Never done   OPHTHALMOLOGY EXAM  Never done   COLONOSCOPY  Never done   PNA vac Low Risk Adult (1 of 2 - PCV13) Never done   INFLUENZA VACCINE  Never done    Colorectal cancer screening: Completed 09/09/2016. Repeat every 7 years  Lung Cancer Screening: (Low Dose CT Chest recommended if Age 85-80 years, 30 pack-year currently smoking OR have quit w/in 15years.) does qualify.   Lung Cancer Screening Referral: no  Additional Screening:  Hepatitis C Screening: does qualify; Completed: no  Vision Screening: Recommended annual ophthalmology exams for early detection of glaucoma and other disorders of the eye. Is the patient up to date with their annual eye exam?  Yes  Who is the provider or what is the name of the office in which the patient attends annual eye exams? [facility] If pt is not  established with a provider, would they like to be referred to a provider to establish care? No .   Dental Screening: Recommended annual dental exams for proper oral hygiene  Community Resource Referral / Chronic Care Management: CRR required this visit?  No   CCM required this visit?  No      Plan:     I have personally reviewed and noted the following in the patient's chart:   Medical and social history Use of alcohol, tobacco or illicit drugs  Current medications and supplements Functional ability and status Nutritional status Physical activity Advanced directives List of other physicians Hospitalizations, surgeries, and ER visits in previous 12 months Vitals Screenings to include cognitive, depression, and falls Referrals and appointments  In addition, I have reviewed and discussed with patient certain preventive protocols, quality metrics, and best practice recommendations. A written personalized care plan for preventive services as well as general preventive health recommendations were provided to patient.     Sheral Flow, LPN   [id number]   Nurse Notes:  Patient is cogitatively intact. There were no vitals filed for this visit. There is no height or weight on file to calculate BMI. Patient stated that he has no issues with gait or balance; does not use any assistive devices.  Medical screening examination/treatment/procedure(s) were performed by non-physician practitioner and as supervising physician I was immediately available for consultation/collaboration.  I agree with above. Lew Dawes, MD

## 2020-05-23 ENCOUNTER — Other Ambulatory Visit: Payer: Self-pay

## 2020-05-26 ENCOUNTER — Encounter: Payer: Self-pay | Admitting: Internal Medicine

## 2020-05-26 ENCOUNTER — Other Ambulatory Visit: Payer: Self-pay

## 2020-05-26 ENCOUNTER — Ambulatory Visit (INDEPENDENT_AMBULATORY_CARE_PROVIDER_SITE_OTHER): Payer: HMO | Admitting: Internal Medicine

## 2020-05-26 DIAGNOSIS — I2510 Atherosclerotic heart disease of native coronary artery without angina pectoris: Secondary | ICD-10-CM

## 2020-05-26 DIAGNOSIS — E118 Type 2 diabetes mellitus with unspecified complications: Secondary | ICD-10-CM | POA: Diagnosis not present

## 2020-05-26 DIAGNOSIS — I2583 Coronary atherosclerosis due to lipid rich plaque: Secondary | ICD-10-CM | POA: Diagnosis not present

## 2020-05-26 DIAGNOSIS — K219 Gastro-esophageal reflux disease without esophagitis: Secondary | ICD-10-CM | POA: Insufficient documentation

## 2020-05-26 DIAGNOSIS — I10 Essential (primary) hypertension: Secondary | ICD-10-CM

## 2020-05-26 DIAGNOSIS — N1831 Chronic kidney disease, stage 3a: Secondary | ICD-10-CM | POA: Diagnosis not present

## 2020-05-26 MED ORDER — COENZYME Q10 30 MG PO CAPS: 30.0000 mg | ORAL_CAPSULE | Freq: Three times a day (TID) | ORAL | 3 refills | Status: DC

## 2020-05-26 MED ORDER — PRAVASTATIN SODIUM 20 MG PO TABS: 20.0000 mg | ORAL_TABLET | Freq: Every day | ORAL | 3 refills | Status: DC

## 2020-05-26 MED ORDER — FAMOTIDINE 40 MG PO TABS: 40.0000 mg | ORAL_TABLET | Freq: Every day | ORAL | 3 refills | Status: DC

## 2020-05-26 NOTE — Assessment & Plan Note (Signed)
Pepcid GI ref if not better

## 2020-05-26 NOTE — Assessment & Plan Note (Signed)
Olmesart HCT 

## 2020-05-26 NOTE — Assessment & Plan Note (Signed)
Microalbuminuria

## 2020-05-26 NOTE — Patient Instructions (Signed)
   B-complex with Niacin 100 mg    Lion's mane  

## 2020-05-26 NOTE — Assessment & Plan Note (Signed)
Coronary calcium score of 1031.  CL stress test was ok

## 2020-05-26 NOTE — Progress Notes (Signed)
Subjective:  Patient ID: Ryan Ferguson, male    DOB: 1950/10/14  Age: 69 y.o. MRN: [id number]  CC: Follow-up (2 month f/u)   HPI Ryan Ferguson presents for HTN, hypothyroidism, DM f/u Weakness - not much better w/o Lipitor Wt loss resolved C/o epigastric pain The pt is a poor historian C/o GERD  C/o CP/GERD from "cold air" x months (CL stress tet was low risk)  Outpatient Medications Prior to Visit  Medication Sig Dispense Refill  . aspirin EC 81 MG tablet Take 1 tablet (81 mg total) by mouth daily. 90 tablet 3  . cholecalciferol (VITAMIN D) 1000 UNITS tablet Take 1,000 Units by mouth daily as needed (nutrition).     . Chromium 200 MCG CAPS Take 1 capsule by mouth daily.    Marland Kitchen levothyroxine (SYNTHROID) 112 MCG tablet Take 1 tablet (112 mcg total) by mouth every morning. 90 tablet 1  . metFORMIN (GLUCOPHAGE) 1000 MG tablet Take 1 tablet (1,000 mg total) by mouth 2 (two) times daily. 180 tablet 1  . olmesartan-hydrochlorothiazide (BENICAR HCT) 40-12.5 MG tablet Take 1 tablet by mouth daily. 90 tablet 3  . ONE TOUCH ULTRA TEST test strip USE TEST STRIPS TO TEST BLOOD SUGAR ONCE DAILY  4  . atorvastatin (LIPITOR) 40 MG tablet Take 40 mg by mouth daily. (Patient not taking: Reported on 05/26/2020)     No facility-administered medications prior to visit.    ROS: Review of Systems  Constitutional: Negative for appetite change, fatigue and unexpected weight change.  HENT: Negative for congestion, nosebleeds, sneezing, sore throat and trouble swallowing.   Eyes: Negative for itching and visual disturbance.  Respiratory: Negative for cough.   Cardiovascular: Negative for chest pain, palpitations and leg swelling.  Gastrointestinal: Negative for abdominal distention, blood in stool, diarrhea and nausea.  Genitourinary: Negative for frequency and hematuria.  Musculoskeletal: Positive for arthralgias, back pain and gait problem. Negative for joint swelling and neck pain.  Skin:  Negative for rash.  Neurological: Negative for dizziness, tremors, speech difficulty and weakness.  Psychiatric/Behavioral: Negative for agitation, dysphoric mood, sleep disturbance and suicidal ideas. The patient is not nervous/anxious.     Objective:  BP 140/82 (BP Location: Left Arm)   Pulse 64   Temp 98.2 F (36.8 C) (Oral)   Wt 148 lb 3.2 oz (67.2 kg)   SpO2 96%   BMI 24.66 kg/m   BP Readings from Last 3 Encounters:  05/26/20 140/82  03/26/20 (!) 160/98  05/24/19 (!) 148/70    Wt Readings from Last 3 Encounters:  05/26/20 148 lb 3.2 oz (67.2 kg)  03/26/20 143 lb (64.9 kg)  06/29/19 149 lb (67.6 kg)    Physical Exam Constitutional:      General: He is not in acute distress.    Appearance: He is well-developed.     Comments: NAD  Eyes:     Conjunctiva/sclera: Conjunctivae normal.     Pupils: Pupils are equal, round, and reactive to light.  Neck:     Thyroid: No thyromegaly.     Vascular: No JVD.  Cardiovascular:     Rate and Rhythm: Normal rate and regular rhythm.     Heart sounds: Normal heart sounds. No murmur heard.  No friction rub. No gallop.   Pulmonary:     Effort: Pulmonary effort is normal. No respiratory distress.     Breath sounds: Normal breath sounds. No wheezing or rales.  Chest:     Chest wall: No tenderness.  Abdominal:  General: Bowel sounds are normal. There is no distension.     Palpations: Abdomen is soft. There is no mass.     Tenderness: There is no abdominal tenderness. There is no guarding or rebound.  Musculoskeletal:        General: No tenderness. Normal range of motion.     Cervical back: Normal range of motion.  Lymphadenopathy:     Cervical: No cervical adenopathy.  Skin:    General: Skin is warm and dry.     Findings: No rash.  Neurological:     Mental Status: He is alert and oriented to person, place, and time.     Cranial Nerves: No cranial nerve deficit.     Motor: No abnormal muscle tone.     Coordination:  Coordination normal.     Gait: Gait normal.     Deep Tendon Reflexes: Reflexes are normal and symmetric.  Psychiatric:        Behavior: Behavior normal.        Thought Content: Thought content normal.        Judgment: Judgment normal.     A total time of >45 minutes was spent preparing to see the patient, reviewing tests, x-rays, operative reports and outside records.  Also, obtaining history and performing comprehensive physical exam.  Additionally, counseling the patient regarding the above listed issues.   Finally, documenting clinical information in the health records, coordination of care, educating the patient for GERD, claudication.  Lab Results  Component Value Date   WBC 5.4 03/26/2020   HGB 14.2 03/26/2020   HCT 41.6 03/26/2020   PLT 217.0 03/26/2020   GLUCOSE 230 (H) 03/26/2020   CHOL 197 03/26/2020   TRIG 120.0 03/26/2020   HDL 52.70 03/26/2020   LDLCALC 120 (H) 03/26/2020   ALT 15 03/26/2020   AST 12 03/26/2020   NA 135 03/26/2020   K 4.0 03/26/2020   CL 96 03/26/2020   CREATININE 1.07 03/26/2020   BUN 25 (H) 03/26/2020   CO2 31 03/26/2020   TSH 0.88 03/26/2020   PSA 2.17 03/26/2020   HGBA1C 7.3 (H) 03/26/2020   MICROALBUR 2.3 (H) 03/26/2020    MR ABDOMEN WWO CONTRAST  Result Date: 07/13/2019 CLINICAL DATA:  Follow-up kidney lesion EXAM: MRI ABDOMEN WITHOUT AND WITH CONTRAST TECHNIQUE: Multiplanar multisequence MR imaging of the abdomen was performed both before and after the administration of intravenous contrast. CONTRAST:  19mL GADAVIST GADOBUTROL 1 MMOL/ML IV SOLN COMPARISON:  CT AP 06/11/2019 FINDINGS: Lower chest: No acute findings. Hepatobiliary: There is no suspicious liver abnormality identified. No abnormal enhancement. The gallbladder is normal. No gallstones or gallbladder wall thickening. No biliary dilatation. Pancreas: No mass, inflammatory changes, or other parenchymal abnormality identified. Spleen:  Within normal limits in size and appearance.  Adrenals/Urinary Tract:  Normal adrenal glands. Small cyst within anterior cortex of right lower pole measures 9 mm. Three adjacent cysts are identified within the upper pole of left kidney. The largest measures 3.5 cm, image 16/6. Corresponding to the complex lesion identified on CT is a well-circumscribed cortical based, lateral upper pole lesion which is T1 isointense and T2 hyperintense. There is no measurable enhancement within this lesion between the pre and post-contrast images. On the subtraction images no enhancement is identified within this structure. This is compatible with a Bosniak category 2 lesion. Several small cystic lesions within the inferior pole of left kidney are too small to reliably characterize but also likely represent Bosniak category 1 lesions. No solid enhancing kidney  lesions identified at this time. No hydronephrosis. Stomach/Bowel: Visualized portions within the abdomen are unremarkable. Vascular/Lymphatic: Aortic atherosclerosis. No abdominopelvic adenopathy identified. Other:  No free fluid or fluid collections Musculoskeletal: No suspicious bone lesions identified. IMPRESSION: 1. Bilateral kidney cysts are identified compatible with Bosniak category 1 and 2 kidney cysts. The mildly complex lesion within the lateral cortex of the left kidney does not exhibit enhancement postcontrast and is compatible with a Bosniak category 2 lesion. 2.  Aortic Atherosclerosis (ICD10-I70.0). Electronically Signed   By: Kerby Moors M.D.   On: 07/13/2019 08:48    Assessment & Plan:

## 2020-06-11 ENCOUNTER — Other Ambulatory Visit: Payer: Self-pay

## 2020-06-11 NOTE — Patient Outreach (Signed)
  [facility]) Care Management Chronic Special Needs Program    06/11/2020  Name: Ryan Ferguson, DOB: 09-15-1950  MRN: [id number]   Mr. Ryan Ferguson is enrolled in a chronic special needs plan for Diabetes.  [facility] [employer] Care Management Team has assumed care and services for this member. Case closed by [place] RN, [facility], [place] Management [phone]

## 2020-07-07 DIAGNOSIS — E119 Type 2 diabetes mellitus without complications: Secondary | ICD-10-CM | POA: Diagnosis not present

## 2020-07-07 DIAGNOSIS — E1136 Type 2 diabetes mellitus with diabetic cataract: Secondary | ICD-10-CM | POA: Diagnosis not present

## 2020-07-07 DIAGNOSIS — E039 Hypothyroidism, unspecified: Secondary | ICD-10-CM | POA: Diagnosis not present

## 2020-07-07 DIAGNOSIS — Z7984 Long term (current) use of oral hypoglycemic drugs: Secondary | ICD-10-CM | POA: Diagnosis not present

## 2020-07-25 DIAGNOSIS — E1136 Type 2 diabetes mellitus with diabetic cataract: Secondary | ICD-10-CM | POA: Diagnosis not present

## 2020-07-25 DIAGNOSIS — Z7984 Long term (current) use of oral hypoglycemic drugs: Secondary | ICD-10-CM | POA: Diagnosis not present

## 2020-07-28 ENCOUNTER — Ambulatory Visit (INDEPENDENT_AMBULATORY_CARE_PROVIDER_SITE_OTHER): Payer: HMO | Admitting: Internal Medicine

## 2020-07-28 ENCOUNTER — Other Ambulatory Visit: Payer: Self-pay

## 2020-07-28 ENCOUNTER — Encounter: Payer: Self-pay | Admitting: Internal Medicine

## 2020-07-28 DIAGNOSIS — K219 Gastro-esophageal reflux disease without esophagitis: Secondary | ICD-10-CM

## 2020-07-28 DIAGNOSIS — E0590 Thyrotoxicosis, unspecified without thyrotoxic crisis or storm: Secondary | ICD-10-CM | POA: Diagnosis not present

## 2020-07-28 DIAGNOSIS — E118 Type 2 diabetes mellitus with unspecified complications: Secondary | ICD-10-CM

## 2020-07-28 DIAGNOSIS — E034 Atrophy of thyroid (acquired): Secondary | ICD-10-CM

## 2020-07-28 DIAGNOSIS — I2510 Atherosclerotic heart disease of native coronary artery without angina pectoris: Secondary | ICD-10-CM

## 2020-07-28 DIAGNOSIS — I10 Essential (primary) hypertension: Secondary | ICD-10-CM | POA: Diagnosis not present

## 2020-07-28 DIAGNOSIS — I2583 Coronary atherosclerosis due to lipid rich plaque: Secondary | ICD-10-CM

## 2020-07-28 DIAGNOSIS — N1831 Chronic kidney disease, stage 3a: Secondary | ICD-10-CM | POA: Diagnosis not present

## 2020-07-28 LAB — COMPREHENSIVE METABOLIC PANEL
ALT: 20 U/L (ref 0–53)
AST: 14 U/L (ref 0–37)
Albumin: 4.4 g/dL (ref 3.5–5.2)
Alkaline Phosphatase: 54 U/L (ref 39–117)
BUN: 21 mg/dL (ref 6–23)
CO2: 29 mEq/L (ref 19–32)
Calcium: 10.3 mg/dL (ref 8.4–10.5)
Chloride: 98 mEq/L (ref 96–112)
Creatinine, Ser: 1.02 mg/dL (ref 0.40–1.50)
GFR: 74.76 mL/min (ref >60.00)
Glucose, Bld: 163 mg/dL — ABNORMAL HIGH (ref 70–99)
Potassium: 3.6 mEq/L (ref 3.5–5.1)
Sodium: 137 mEq/L (ref 135–145)
Total Bilirubin: 0.4 mg/dL (ref 0.2–1.2)
Total Protein: 7.2 g/dL (ref 6.0–8.3)

## 2020-07-28 LAB — H. PYLORI ANTIBODY, IGG: H Pylori IgG: POSITIVE — AB

## 2020-07-28 LAB — HEMOGLOBIN A1C: Hgb A1c MFr Bld: 7.4 % — ABNORMAL HIGH (ref 4.6–6.5)

## 2020-07-28 MED ORDER — AMLODIPINE BESYLATE 2.5 MG PO TABS: 2.5000 mg | ORAL_TABLET | Freq: Every day | ORAL | 3 refills | Status: DC

## 2020-07-28 NOTE — Progress Notes (Signed)
Subjective:  Patient ID: Ryan Ferguson, male    DOB: 1950/12/01  Age: 70 y.o. MRN: [id number]  CC: Follow-up (2 month f/u)   HPI Ryan Ferguson presents for heartburn from eating fast food. Not taking Famotidine. F/u DM, hypothyroidism, HTN C/o wt gain SBP - fluctuating at home CBGs 150 in am  Outpatient Medications Prior to Visit  Medication Sig Dispense Refill  . aspirin EC 81 MG tablet Take 1 tablet (81 mg total) by mouth daily. 90 tablet 3  . cholecalciferol (VITAMIN D) 1000 UNITS tablet Take 1,000 Units by mouth daily as needed (nutrition).     Marland Kitchen levothyroxine (SYNTHROID) 112 MCG tablet Take 1 tablet (112 mcg total) by mouth every morning. 90 tablet 1  . metFORMIN (GLUCOPHAGE) 1000 MG tablet Take 1 tablet (1,000 mg total) by mouth 2 (two) times daily. 180 tablet 1  . olmesartan-hydrochlorothiazide (BENICAR HCT) 40-12.5 MG tablet Take 1 tablet by mouth daily. 90 tablet 3  . ONE TOUCH ULTRA TEST test strip USE TEST STRIPS TO TEST BLOOD SUGAR ONCE DAILY  4  . co-enzyme Q-10 30 MG capsule Take 1 capsule (30 mg total) by mouth 3 (three) times daily. (Patient not taking: Reported on 07/28/2020) 100 capsule 3  . famotidine (PEPCID) 40 MG tablet Take 1 tablet (40 mg total) by mouth daily. (Patient not taking: Reported on 07/28/2020) 90 tablet 3  . pravastatin (PRAVACHOL) 20 MG tablet Take 1 tablet (20 mg total) by mouth daily. (Patient not taking: Reported on 07/28/2020) 90 tablet 3   No facility-administered medications prior to visit.    ROS: Review of Systems  Constitutional: Positive for unexpected weight change. Negative for appetite change and fatigue.  HENT: Negative for congestion, nosebleeds, sneezing, sore throat and trouble swallowing.   Eyes: Negative for itching and visual disturbance.  Respiratory: Negative for cough.   Cardiovascular: Negative for chest pain, palpitations and leg swelling.  Gastrointestinal: Negative for abdominal distention, blood in stool, diarrhea  and nausea.  Genitourinary: Negative for frequency and hematuria.  Musculoskeletal: Negative for back pain, gait problem, joint swelling and neck pain.  Skin: Negative for rash.  Neurological: Negative for dizziness, tremors, speech difficulty and weakness.  Psychiatric/Behavioral: Negative for agitation, dysphoric mood and sleep disturbance. The patient is not nervous/anxious.     Objective:  BP (!) 152/80 (BP Location: Left Arm)   Pulse 62   Temp 98.1 F (36.7 C) (Oral)   Ht 5\' 5"  (1.651 m)   Wt 153 lb 12.8 oz (69.8 kg)   SpO2 98%   BMI 25.59 kg/m   BP Readings from Last 3 Encounters:  07/28/20 (!) 152/80  05/26/20 140/82  03/26/20 (!) 160/98    Wt Readings from Last 3 Encounters:  07/28/20 153 lb 12.8 oz (69.8 kg)  05/26/20 148 lb 3.2 oz (67.2 kg)  03/26/20 143 lb (64.9 kg)    Physical Exam Constitutional:      General: He is not in acute distress.    Appearance: He is well-developed.     Comments: NAD  HENT:     Mouth/Throat:     Mouth: Oropharynx is clear and moist.  Eyes:     Conjunctiva/sclera: Conjunctivae normal.     Pupils: Pupils are equal, round, and reactive to light.  Neck:     Thyroid: No thyromegaly.     Vascular: No JVD.  Cardiovascular:     Rate and Rhythm: Normal rate and regular rhythm.     Pulses: Intact distal pulses.  Heart sounds: Normal heart sounds. No murmur heard. No friction rub. No gallop.   Pulmonary:     Effort: Pulmonary effort is normal. No respiratory distress.     Breath sounds: Normal breath sounds. No wheezing or rales.  Chest:     Chest wall: No tenderness.  Abdominal:     General: Bowel sounds are normal. There is no distension.     Palpations: Abdomen is soft. There is no mass.     Tenderness: There is no abdominal tenderness. There is no guarding or rebound.  Musculoskeletal:        General: No tenderness or edema. Normal range of motion.     Cervical back: Normal range of motion.  Lymphadenopathy:      Cervical: No cervical adenopathy.  Skin:    General: Skin is warm and dry.     Findings: No rash.  Neurological:     Mental Status: He is alert and oriented to person, place, and time.     Cranial Nerves: No cranial nerve deficit.     Motor: No abnormal muscle tone.     Coordination: He displays a negative Romberg sign. Coordination normal.     Gait: Gait normal.     Deep Tendon Reflexes: Reflexes are normal and symmetric.  Psychiatric:        Mood and Affect: Mood and affect normal.        Behavior: Behavior normal.        Thought Content: Thought content normal.        Judgment: Judgment normal.     Lab Results  Component Value Date   WBC 5.4 03/26/2020   HGB 14.2 03/26/2020   HCT 41.6 03/26/2020   PLT 217.0 03/26/2020   GLUCOSE 230 (H) 03/26/2020   CHOL 197 03/26/2020   TRIG 120.0 03/26/2020   HDL 52.70 03/26/2020   LDLCALC 120 (H) 03/26/2020   ALT 15 03/26/2020   AST 12 03/26/2020   NA 135 03/26/2020   K 4.0 03/26/2020   CL 96 03/26/2020   CREATININE 1.07 03/26/2020   BUN 25 (H) 03/26/2020   CO2 31 03/26/2020   TSH 0.88 03/26/2020   PSA 2.17 03/26/2020   HGBA1C 7.3 (H) 03/26/2020   MICROALBUR 2.3 (H) 03/26/2020    MR ABDOMEN WWO CONTRAST  Result Date: 07/13/2019 CLINICAL DATA:  Follow-up kidney lesion EXAM: MRI ABDOMEN WITHOUT AND WITH CONTRAST TECHNIQUE: Multiplanar multisequence MR imaging of the abdomen was performed both before and after the administration of intravenous contrast. CONTRAST:  35mL GADAVIST GADOBUTROL 1 MMOL/ML IV SOLN COMPARISON:  CT AP 06/11/2019 FINDINGS: Lower chest: No acute findings. Hepatobiliary: There is no suspicious liver abnormality identified. No abnormal enhancement. The gallbladder is normal. No gallstones or gallbladder wall thickening. No biliary dilatation. Pancreas: No mass, inflammatory changes, or other parenchymal abnormality identified. Spleen:  Within normal limits in size and appearance. Adrenals/Urinary Tract:  Normal  adrenal glands. Small cyst within anterior cortex of right lower pole measures 9 mm. Three adjacent cysts are identified within the upper pole of left kidney. The largest measures 3.5 cm, image 16/6. Corresponding to the complex lesion identified on CT is a well-circumscribed cortical based, lateral upper pole lesion which is T1 isointense and T2 hyperintense. There is no measurable enhancement within this lesion between the pre and post-contrast images. On the subtraction images no enhancement is identified within this structure. This is compatible with a Bosniak category 2 lesion. Several small cystic lesions within the inferior pole of left  kidney are too small to reliably characterize but also likely represent Bosniak category 1 lesions. No solid enhancing kidney lesions identified at this time. No hydronephrosis. Stomach/Bowel: Visualized portions within the abdomen are unremarkable. Vascular/Lymphatic: Aortic atherosclerosis. No abdominopelvic adenopathy identified. Other:  No free fluid or fluid collections Musculoskeletal: No suspicious bone lesions identified. IMPRESSION: 1. Bilateral kidney cysts are identified compatible with Bosniak category 1 and 2 kidney cysts. The mildly complex lesion within the lateral cortex of the left kidney does not exhibit enhancement postcontrast and is compatible with a Bosniak category 2 lesion. 2.  Aortic Atherosclerosis (ICD10-I70.0). Electronically Signed   By: Kerby Moors M.D.   On: 07/13/2019 08:48    Assessment & Plan:    Walker Kehr, MD

## 2020-07-28 NOTE — Assessment & Plan Note (Signed)
Cont w/Levothroid TSH

## 2020-07-28 NOTE — Assessment & Plan Note (Signed)
On levothroid 

## 2020-07-28 NOTE — Assessment & Plan Note (Signed)
Check CMET. 

## 2020-07-28 NOTE — Patient Instructions (Addendum)
Mylanta or TUMS for heartburn ????? ????????? ??????? ??? ??????????????????? ?????????? ??????? ? ???????? Food Choices for Gastroesophageal Reflux Disease, Adult ???? ? ??? ??????????????????? ?????????? ??????? (????), ????? ????? ???????? ??????? ? ???? ???????? ? ???. ????? ?????????? ????????? ???????? ????????? ?????????? ??? ????. ??? ????????? ?????? ? ?????? ???????? ???? ?????????? ?? ????? ? ?????-?????????. ?????? ?????? ?? ?????????? ????? ?????? ?????? ???????? ?? ?????????  ????? ???????? ? ?????? ??????????? ??????????? ????. ????????, ?????????? ????? 5% ???????? ????? (??) ????? ? 0? ?????-????? ????? ?????? ? ?????????? ????? ?????????. ????????????? ????  ???????? ???????? ??????? ???????? ?????? ?????. ??? ????? ???? ?????????, ????????????? ?? ????, ????? ?? ????? ??? ???????? ????. ??? ??? ?????? ?? ??????? ????????????? ???????? ?????????? ???? ??? ????????????? ????.  ????? ???????? ????, ?????????? ???????????? ???????? ????? ? ?????? ????????????. ???????????? ??????? ????  ????????? ???????? ???? ? ?????? ??????????? ????, ???????? ??????, ?????, ?????????????? ????????, ???????? ???????? ????????, ??????? ????, ???? ? ???????? ?????.  ????????? ????? ?????????? ???????? ?????? ???? ???????? ??????? ????. ????? ???????? ? ????????????? ????. ?????????? ?? ??????????? ?????? ? ?? ?????? ? ??????? 2-3 ????? ????? ???.  ?????????? ???????????? ???? ? ??????? ??????????? ????, ???????? ??????? ????, ??? ??????? ?????????.  ?????????? ???????????? ?????? ?????????, ????? ??? ???????????? ?????, ????????? ????? ? ?????????? ???.  ????????? ??????????, ???? ????????????? ????? ??????? ??????: ? ????????, ??????? ???????? ????????. ??? ????? ?????????? ? ?????? ?????. ?????? ??????? ???????, ????? ?????????????? ? ??? ????????, ??????? ???????? ????????. ? ????????. ? ???????????? ???????? ? ??????? ?????????? ?? ????? ???. ? ???????????? ???? ?? 2-3 ???? ?? ???.    ????? ?????  ????????????? ?????????? ???. ???????? ? ?????? ???????? ?????, ????? ??? ??? ??? ???????? ????????. ???? ??? ????? ???????? ???, ?????????? ??????????? ?????? ???????? ?????, ????? ??????? ??? ?????????? ????????.  ?????????? ?????????? ?????????? ? ??????? ?? ??????? ???? 30 ????? ?????? 5 ??? ????? ???? ? ?????? ??? ??? ??????? ????? ??????? ??????.  ?????????? ?? ?????? ??????, ?????? ??????????? ? ????? ? ?????.  ?? ???????????? ??????, ??? ???????? ??????? ??? ?????. ? ????? ???????? ????????? ????????, ??????????? ????? ? ?????????? ??? ????????, ????? ??? ??????????? ????????. ???? ??? ?????? ?? ???? ???????? ??? ????? ??????, ?????????? ? ?????? ???????? ?????.  ????? ?? ??????? ? ??????????? ??????????. ????????? ???? ??? ?????? ??? ????? ??? ????????? ???????, ????? ?????????? ?????????.  ????? ??????????? ??????? ??? ?????? ????? ???. ????? ??????? ??????????? ????????? ??????????????? ???????? ???????????????? ?????, ?????????? ??????, ?????, ?????????????? ????????, ???????? ???????? ????????, ??????? ????, ???? ? ???????? ?????. ?????? ??????? ?????????. ????????, ??????? ????? ???????? ???????? ? ?????? ????????, ????? ?? ????????????? ??????? ????????? ? ??????? ????????. ?????? ? ????? ??????? ?????? ??????????, ????? ???????? ???????? ??????????? ??? ???. ????????????? ???? ????? ???? ???????? ??????? ??????????????? ????????? ??????? ? ????????. ??? ????????? ?????????????? ?????????? ?????????? ? ?????-?????????.   ????? ????????? ??????? ????????? ??????????? ????????? ?? ???? ????????? ????? ?????? ?????????????? ???????? ????. ??? ???? ??????. ??????????????????? ? ?????????? ??? ??????? ??????, ??????? ??????? ??? ??????????, ????? ?????? ????????? ??????? ????????, ???? ??????? ???????. ?????? ????? ??????, ?????????????? ? ??????????? ????. ????? ??????, ??????? ???????? ????????. ??? ????????? ????? ? ????? ????? ?????????? ??????????, ????? ???  ?????????, ?????????, ?????? ? ??????. ????? ??????? ?? ??????? ?????. ????????? ???. ????? ?????, ?????????????? ? ??????????? ????. ????? ?????, ??????? ???????? ????????. ??? ????????? ????? ? ????????????????? ????????? ????? ?????????? ?????? ? ???????? ????????, ????? ????, ???, ?????? ? ????. ???????? ??????? ??? ???????????? ???? ? ??????????? ????. ???? ? ?????? ????? ???? ? ??????? ??????????? ????, ???????? ?????? ???????? ??? ???????, ???-????, ?????, ???????, ???????, ?????? ? ?????. ??????? ???? ??? ?????, ??????? ??????? ???? ? ??????? ??????. ????? ? ???????? ????? ? ??????? ???????????. ???????? ???????? ??????? ?????? ? ???????? ???????. ???????. ??????. ??????????. ????????? ???. ???????? ????????. ???? ? ????? ????????? ?????. ????????. ?????????? ???. ???????? ?????. ??????? ???? ? ???, ? ???????? ??? ??? ????. ???????????? ???????. ??????? ????. ?????????????? ???????. ????????? ???, ?????????????? ?? ?????? ???????, ????????, ?? ????????? ??? ??????????. ???????? ???. ??????????? ???????. ???????? ? ??????? ??????? ? ?????. ???????. ???????? ? ?????? ?????. ???? ???????? ??? ????. ?????????? ????. ????? ??????, ????? ??? ????????, ?????????? ????????. ??? ????????? ????? ???? ????? ?????????? ?????, ??????? ???? ??? ???????? ???????? ?? ?????? ??????. ????????????? ???? ?? ???????? ?????? ??????? ?? ????????????? ????????? ??????? ??? ????????. ??? ????????? ?????????????? ?????????? ?????????? ? ?????-?????????. ???????, ??????? ??????? ?????? ?????? ???????? ????? ????????? ? ????? ? ?????? ????? ?????? ???????? ?????? ?????, ??????????????? ??? ??????? ????????? ????. ???? ????????? ? ????? ? ?????? ????? ?? ???????? ? ????????? ????? ?????????, ?????????? ? ????? ??????? ?????? ? ?????? ????????????? ??????????. ??? ????? ????? ?????????????? ??????????  [employer] (????????????? ???? ??????????? ?????????-?????????  ??????): [email or web address] ??????  ???? ? ??? ??????????????????? ?????????? ??????? (????), ????? ?????????? ????????? ? ?????? ????? ????? ? ???????????? ???? ?????????????? ?????????? ??????????? ??? ????.  ????????? ????? ?????????? ???????? ?????? ???? ???????? ??????? ????. ????? ???????? ? ????????????? ????. ?????????? ?? ??????????? ?????? ? ?? ?????? ? ??????? 2-3 ????? ????? ???.  ?????????? ???????????? ???? ? ??????? ??????????? ????, ???????? ??????? ????, ??? ??????? ?????????. ??? ?????????? ?? ????? ???????? ??????, ??????????????? ????? ??????. ??????????? ???????? ????? ???????????? ??? ??????? ? ????? ??????? ??????. Document Revised: 01/08/2020 Document Reviewed: 01/08/2020 Elsevier Patient Education  [place].

## 2020-07-28 NOTE — Assessment & Plan Note (Signed)
No CP 

## 2020-07-28 NOTE — Assessment & Plan Note (Addendum)
Fluctuating BP at home If BP is elevated - add Norvasc 2.5 mg/d

## 2020-07-28 NOTE — Assessment & Plan Note (Addendum)
Worse H pylori Pepcid

## 2020-07-28 NOTE — Assessment & Plan Note (Signed)
On Metformin A1c

## 2020-07-29 ENCOUNTER — Other Ambulatory Visit: Payer: Self-pay | Admitting: Internal Medicine

## 2020-07-29 DIAGNOSIS — A048 Other specified bacterial intestinal infections: Secondary | ICD-10-CM | POA: Insufficient documentation

## 2020-07-29 MED ORDER — AMOXICILLIN 500 MG PO CAPS: 1000.0000 mg | ORAL_CAPSULE | Freq: Two times a day (BID) | ORAL | 0 refills | Status: DC

## 2020-07-29 MED ORDER — REPAGLINIDE 1 MG PO TABS: 1.0000 mg | ORAL_TABLET | Freq: Three times a day (TID) | ORAL | 11 refills | Status: DC

## 2020-07-29 MED ORDER — LANSOPRAZOLE 30 MG PO CPDR: 30.0000 mg | DELAYED_RELEASE_CAPSULE | Freq: Two times a day (BID) | ORAL | 1 refills | Status: DC

## 2020-07-29 MED ORDER — CLARITHROMYCIN 500 MG PO TABS: 500.0000 mg | ORAL_TABLET | Freq: Two times a day (BID) | ORAL | 0 refills | Status: AC

## 2020-07-30 DIAGNOSIS — H2513 Age-related nuclear cataract, bilateral: Secondary | ICD-10-CM | POA: Diagnosis not present

## 2020-07-30 DIAGNOSIS — E119 Type 2 diabetes mellitus without complications: Secondary | ICD-10-CM | POA: Diagnosis not present

## 2020-07-30 DIAGNOSIS — H52203 Unspecified astigmatism, bilateral: Secondary | ICD-10-CM | POA: Diagnosis not present

## 2020-07-30 DIAGNOSIS — H5310 Unspecified subjective visual disturbances: Secondary | ICD-10-CM | POA: Diagnosis not present

## 2020-07-30 LAB — HM DIABETES EYE EXAM

## 2020-08-01 ENCOUNTER — Encounter: Payer: Self-pay | Admitting: Internal Medicine

## 2020-09-10 ENCOUNTER — Telehealth: Payer: Self-pay | Admitting: Internal Medicine

## 2020-09-10 DIAGNOSIS — R1013 Epigastric pain: Secondary | ICD-10-CM

## 2020-09-10 DIAGNOSIS — K219 Gastro-esophageal reflux disease without esophagitis: Secondary | ICD-10-CM

## 2020-09-10 DIAGNOSIS — A048 Other specified bacterial intestinal infections: Secondary | ICD-10-CM

## 2020-09-10 NOTE — Telephone Encounter (Signed)
Patient/spouse calling to report dizziness Declined appointment  Requesting call from Dr Alain Marion

## 2020-09-10 NOTE — Telephone Encounter (Signed)
Called pt spoke w/wife she states pt still having pain in his stomach.. right side under rib cage. She states MD was going to refer him to see Gastroenterology but he had decline. Wife states he would like to see the GI doctor. She states the pain is day and night.Marland KitchenJohny Ferguson

## 2020-09-10 NOTE — Telephone Encounter (Signed)
Will do. Thanks.

## 2020-09-29 ENCOUNTER — Telehealth: Payer: Self-pay | Admitting: Internal Medicine

## 2020-09-29 NOTE — Telephone Encounter (Signed)
Patients wife called and said that she thinks after the patient takes repaglinide (PRANDIN) 1 MG tablet his blood sugar drops. She said that the lowest it has dropped was 64. She can be reached at [phone]. Please advise

## 2020-09-30 NOTE — Telephone Encounter (Signed)
Take Repaglinide with meals only. If skipped a meal - do not take repaglinide. Thx

## 2020-10-01 NOTE — Telephone Encounter (Signed)
Notified pt wife w/MD response. She states she would like to make appt husband BS been running in th 60's and never happen before/ she states he start sweating a lot, and shaking. Made appt for tomorrow @ 10:00.Marland KitchenJohny Chess

## 2020-10-02 ENCOUNTER — Telehealth: Payer: Self-pay | Admitting: Cardiology

## 2020-10-02 ENCOUNTER — Other Ambulatory Visit: Payer: Self-pay

## 2020-10-02 ENCOUNTER — Ambulatory Visit (INDEPENDENT_AMBULATORY_CARE_PROVIDER_SITE_OTHER): Payer: HMO | Admitting: Internal Medicine

## 2020-10-02 ENCOUNTER — Encounter: Payer: Self-pay | Admitting: Internal Medicine

## 2020-10-02 DIAGNOSIS — I2510 Atherosclerotic heart disease of native coronary artery without angina pectoris: Secondary | ICD-10-CM | POA: Diagnosis not present

## 2020-10-02 DIAGNOSIS — I2583 Coronary atherosclerosis due to lipid rich plaque: Secondary | ICD-10-CM

## 2020-10-02 DIAGNOSIS — E118 Type 2 diabetes mellitus with unspecified complications: Secondary | ICD-10-CM

## 2020-10-02 DIAGNOSIS — R5383 Other fatigue: Secondary | ICD-10-CM | POA: Diagnosis not present

## 2020-10-02 DIAGNOSIS — I10 Essential (primary) hypertension: Secondary | ICD-10-CM | POA: Diagnosis not present

## 2020-10-02 DIAGNOSIS — R0789 Other chest pain: Secondary | ICD-10-CM | POA: Diagnosis not present

## 2020-10-02 MED ORDER — REPAGLINIDE 0.5 MG PO TABS: 0.5000 mg | ORAL_TABLET | Freq: Three times a day (TID) | ORAL | 11 refills | Status: DC

## 2020-10-02 MED ORDER — NITROGLYCERIN 0.4 MG SL SUBL: 0.4000 mg | SUBLINGUAL_TABLET | SUBLINGUAL | 3 refills | Status: DC | PRN

## 2020-10-02 MED ORDER — REPAGLINIDE 0.5 MG PO TABS: 0.5000 mg | ORAL_TABLET | Freq: Three times a day (TID) | ORAL | 3 refills | Status: DC

## 2020-10-02 NOTE — Assessment & Plan Note (Addendum)
On Prandin - reduce Prandin to 0.5 mg dose to 3 times a day with food

## 2020-10-02 NOTE — Patient Instructions (Signed)
For a mild COVID-19 case you can take zinc 50 mg a day for 1 week, vitamin C 1000 mg daily for 1 week, vitamin D2 50,000 units weekly for 2 months (unless you are taking vitamin D daily already), Quercetin 500 mg twice a day for 1 week (if you can get it quick enough). Take Allegra or Benadryl.  Maintain good oral hydration and take Tylenol for high fever.   You can buy COVID-19 express tests for home use at [facility] or [facility] for $9.99.   They are less expensive online, roughly $15 for 2 tests.  There is a link below: [email or web address]  You can order 4 free COVID-19 home tests via [employer]  

## 2020-10-02 NOTE — Progress Notes (Signed)
Subjective:  Patient ID: Ryan Ferguson, male    DOB: 27-Jul-1950  Age: 70 y.o. MRN: [id number]  CC: Blood Sugar Problem (Pt wife states when taking the Prandin it make his BS drop too low. Been running in the 60's. There has been times when pt starts shaking, and sweating not himself. Pt states he check BS this am it was 147)   HPI JERMY COUPER presents for DM - low CBGs on Prandin Is concerned about remote ?Covid exposure  C/o substernal CP at times, fatigue w/walking x weeks or months.  [place] lot of time cutting trees, building etc.   Outpatient Medications Prior to Visit  Medication Sig Dispense Refill  . amLODipine (NORVASC) 2.5 MG tablet Take 1 tablet (2.5 mg total) by mouth daily. 90 tablet 3  . amoxicillin (AMOXIL) 500 MG capsule Take 2 capsules (1,000 mg total) by mouth 2 (two) times daily. 40 capsule 0  . aspirin EC 81 MG tablet Take 1 tablet (81 mg total) by mouth daily. 90 tablet 3  . cholecalciferol (VITAMIN D) 1000 UNITS tablet Take 1,000 Units by mouth daily as needed (nutrition).     . lansoprazole (PREVACID) 30 MG capsule Take 1 capsule (30 mg total) by mouth 2 (two) times daily before a meal. 60 capsule 1  . levothyroxine (SYNTHROID) 112 MCG tablet Take 1 tablet (112 mcg total) by mouth every morning. 90 tablet 1  . metFORMIN (GLUCOPHAGE) 1000 MG tablet Take 1 tablet (1,000 mg total) by mouth 2 (two) times daily. 180 tablet 1  . olmesartan-hydrochlorothiazide (BENICAR HCT) 40-12.5 MG tablet Take 1 tablet by mouth daily. 90 tablet 3  . ONE TOUCH ULTRA TEST test strip USE TEST STRIPS TO TEST BLOOD SUGAR ONCE DAILY  4  . pravastatin (PRAVACHOL) 20 MG tablet Take 1 tablet (20 mg total) by mouth daily. 90 tablet 3  . repaglinide (PRANDIN) 1 MG tablet Take 1 tablet (1 mg total) by mouth 3 (three) times daily before meals. 90 tablet 11  . co-enzyme Q-10 30 MG capsule Take 1 capsule (30 mg total) by mouth 3 (three) times  daily. (Patient not taking: No sig reported) 100 capsule 3   No facility-administered medications prior to visit.    ROS: Review of Systems  Constitutional: Positive for fatigue. Negative for appetite change and unexpected weight change.  HENT: Negative for congestion, nosebleeds, sneezing, sore throat and trouble swallowing.   Eyes: Negative for itching and visual disturbance.  Respiratory: Negative for cough and shortness of breath.   Cardiovascular: Positive for chest pain. Negative for palpitations and leg swelling.  Gastrointestinal: Negative for abdominal distention, blood in stool, diarrhea and nausea.  Genitourinary: Negative for frequency and hematuria.  Musculoskeletal: Negative for back pain, gait problem, joint swelling and neck pain.  Skin: Negative for rash.  Neurological: Negative for dizziness, tremors, speech difficulty and weakness.  Psychiatric/Behavioral: Negative for agitation, dysphoric mood, sleep disturbance and suicidal ideas. The patient is not nervous/anxious.     Objective:  BP 134/72 (BP Location: Left Arm)   Pulse 65   Temp 98.2 F (36.8 C) (Oral)   SpO2 95%   BP Readings from Last 3 Encounters:  10/02/20 134/72  07/28/20 (!) 152/80  05/26/20 140/82    Wt Readings from Last 3 Encounters:  07/28/20 153 lb 12.8 oz (69.8 kg)  05/26/20 148 lb 3.2 oz (67.2 kg)  03/26/20 143 lb (64.9 kg)  Physical Exam Constitutional:      General: He is not in acute distress.    Appearance: He is well-developed.     Comments: NAD  Eyes:     Conjunctiva/sclera: Conjunctivae normal.     Pupils: Pupils are equal, round, and reactive to light.  Neck:     Thyroid: No thyromegaly.     Vascular: No JVD.  Cardiovascular:     Rate and Rhythm: Normal rate and regular rhythm.     Heart sounds: Normal heart sounds. No murmur heard. No friction rub. No gallop.   Pulmonary:     Effort: Pulmonary effort is normal. No respiratory distress.     Breath sounds: Normal  breath sounds. No wheezing or rales.  Chest:     Chest wall: No tenderness.  Abdominal:     General: Bowel sounds are normal. There is no distension.     Palpations: Abdomen is soft. There is no mass.     Tenderness: There is no abdominal tenderness. There is no guarding or rebound.  Musculoskeletal:        General: No tenderness. Normal range of motion.     Cervical back: Normal range of motion.  Lymphadenopathy:     Cervical: No cervical adenopathy.  Skin:    General: Skin is warm and dry.     Findings: No rash.  Neurological:     Mental Status: He is alert and oriented to person, place, and time.     Cranial Nerves: No cranial nerve deficit.     Motor: No abnormal muscle tone.     Coordination: Coordination normal.     Gait: Gait normal.     Deep Tendon Reflexes: Reflexes are normal and symmetric.  Psychiatric:        Behavior: Behavior normal.        Thought Content: Thought content normal.        Judgment: Judgment normal.     Lab Results  Component Value Date   WBC 5.4 03/26/2020   HGB 14.2 03/26/2020   HCT 41.6 03/26/2020   PLT 217.0 03/26/2020   GLUCOSE 163 (H) 07/28/2020   CHOL 197 03/26/2020   TRIG 120.0 03/26/2020   HDL 52.70 03/26/2020   LDLCALC 120 (H) 03/26/2020   ALT 20 07/28/2020   AST 14 07/28/2020   NA 137 07/28/2020   K 3.6 07/28/2020   CL 98 07/28/2020   CREATININE 1.02 07/28/2020   BUN 21 07/28/2020   CO2 29 07/28/2020   TSH 0.88 03/26/2020   PSA 2.17 03/26/2020   HGBA1C 7.4 (H) 07/28/2020   MICROALBUR 2.3 (H) 03/26/2020    MR ABDOMEN WWO CONTRAST  Result Date: 07/13/2019 CLINICAL DATA:  Follow-up kidney lesion EXAM: MRI ABDOMEN WITHOUT AND WITH CONTRAST TECHNIQUE: Multiplanar multisequence MR imaging of the abdomen was performed both before and after the administration of intravenous contrast. CONTRAST:  6mL GADAVIST GADOBUTROL 1 MMOL/ML IV SOLN COMPARISON:  CT AP 06/11/2019 FINDINGS: Lower chest: No acute findings. Hepatobiliary: There is  no suspicious liver abnormality identified. No abnormal enhancement. The gallbladder is normal. No gallstones or gallbladder wall thickening. No biliary dilatation. Pancreas: No mass, inflammatory changes, or other parenchymal abnormality identified. Spleen:  Within normal limits in size and appearance. Adrenals/Urinary Tract:  Normal adrenal glands. Small cyst within anterior cortex of right lower pole measures 9 mm. Three adjacent cysts are identified within the upper pole of left kidney. The largest measures 3.5 cm, image 16/6. Corresponding to the complex lesion identified on  CT is a well-circumscribed cortical based, lateral upper pole lesion which is T1 isointense and T2 hyperintense. There is no measurable enhancement within this lesion between the pre and post-contrast images. On the subtraction images no enhancement is identified within this structure. This is compatible with a Bosniak category 2 lesion. Several small cystic lesions within the inferior pole of left kidney are too small to reliably characterize but also likely represent Bosniak category 1 lesions. No solid enhancing kidney lesions identified at this time. No hydronephrosis. Stomach/Bowel: Visualized portions within the abdomen are unremarkable. Vascular/Lymphatic: Aortic atherosclerosis. No abdominopelvic adenopathy identified. Other:  No free fluid or fluid collections Musculoskeletal: No suspicious bone lesions identified. IMPRESSION: 1. Bilateral kidney cysts are identified compatible with Bosniak category 1 and 2 kidney cysts. The mildly complex lesion within the lateral cortex of the left kidney does not exhibit enhancement postcontrast and is compatible with a Bosniak category 2 lesion. 2.  Aortic Atherosclerosis (ICD10-I70.0). Electronically Signed   By: Kerby Moors M.D.   On: 07/13/2019 08:48    Assessment & Plan:    Walker Kehr, MD

## 2020-10-02 NOTE — Assessment & Plan Note (Addendum)
R/o cardiac causes. Card ref NTG prn

## 2020-10-02 NOTE — Assessment & Plan Note (Signed)
Cont w/Olmesart HCT, Norvasc 

## 2020-10-02 NOTE — Telephone Encounter (Signed)
Pt c/o of Chest Pain: STAT if CP now or developed within 24 hours  1. Are you having CP right now? Not at this time ,sharp pain  2. Are you experiencing any other symptoms (ex. SOB, nausea, vomiting, sweating)? no  3. How long have you been experiencing CP? About a few weeks  4. Is your CP continuous or coming and going? Comes and go  5. Have you taken Nitroglycerin? yes?   New Message:  This pt was referred back to [place] today(10-02-20)  by Dr Alain Marion. Pt last saw Dr Marlou Porch in 2020. Pt did not want to see anybody but Dr Marlou Porch, did not want to see a PA. I offered him an appt with Vin on 10-29-20. Dr Marlou Porch does not have any availabiltiy soon. Pt wants to be seen asap.

## 2020-10-02 NOTE — Assessment & Plan Note (Addendum)
C/o substernal CP at times, fatigue w/walking x weeks or months.  [place] of time cutting trees, building etc. Atypical CP now - will ref to Cardiology NTG prn prescribed

## 2020-10-02 NOTE — Telephone Encounter (Signed)
Pain in chest today and yesterday, got in w/ PCP today who prescribed NTG.  No associated symptoms. It last 2-3 min and resolves without any intervention.  Comes on unpredictably.  He tends to a small garden and walks the dog regularly on level ground.  The pt was not available for me to speak with directly.  This information came from his spouse.    Elevated calcium score followed by low risk nuc in Jan 2021.  Will route to Dr. Marlou Porch for recommendations and then we will call them back.

## 2020-10-02 NOTE — Assessment & Plan Note (Addendum)
C/o substernal CP at times, fatigue w/walking x weeks or months.  [place] time cutting trees, building etc. Will ref to Cardiology NTG prn prescribed

## 2020-10-08 NOTE — Telephone Encounter (Signed)
Please have him come in to discuss cardiac cath with APP given his symptoms and elevated calcium score.  Thanks Candee Furbish, MD

## 2020-10-08 NOTE — Telephone Encounter (Signed)
Patient has appointment scheduled with APP on 10/29/20. Called and let him know recommendations.  Spoke with spouse with pt's permission.

## 2020-10-22 ENCOUNTER — Other Ambulatory Visit: Payer: Self-pay | Admitting: Internal Medicine

## 2020-10-23 ENCOUNTER — Other Ambulatory Visit: Payer: Self-pay | Admitting: Internal Medicine

## 2020-10-24 ENCOUNTER — Ambulatory Visit: Payer: HMO | Admitting: Gastroenterology

## 2020-10-24 ENCOUNTER — Encounter: Payer: Self-pay | Admitting: Gastroenterology

## 2020-10-24 VITALS — BP 148/80 | HR 64 | Ht 65.0 in | Wt 153.0 lb

## 2020-10-24 DIAGNOSIS — Z86010 Personal history of colonic polyps: Secondary | ICD-10-CM

## 2020-10-24 DIAGNOSIS — K5900 Constipation, unspecified: Secondary | ICD-10-CM

## 2020-10-24 NOTE — Progress Notes (Signed)
HPI: This is a very pleasant 70 year old man who was referred to me by Plotnikov, Evie Lacks, MD  to evaluate constipation, abdominal discomforts.    He is here with his wife today.  They are from [place].  He has had 3 colonoscopies over the past 20 years or so.  His most recent was with Dr. Michail Sermon at [facility] gastroenterology and he believes it was perfectly normal.  He does think that he has had "a lot of polyps" in the past.  Neither he nor his wife remember exactly where the other colonoscopies were done, who did them or what exactly was found.  Colon cancer does not run in his family.   For many years he has been bothered by mild constipation.  He will have to push and strain sometimes to move his bowels and he will only go every 2 to 3 days.  He has generalized abdominal discomfort until he does move his bowels and then he feels quite a bit better.  He says the best he has ever felt was after his colonoscopy preps.  He does not see blood in his stool.  His weight is up in the past 4 months since retiring.  He has tried senna for his mild constipation and that does not work.  Old Data Reviewed: MRI abdomen with and without contrast January 2021, ordered by his urologist to follow-up his kidney lesion.  This showed what seems like fairly low risk bilateral kidney cysts.  Blood work February 2022 hemoglobin A1c 7.4, complete metabolic profile was normal, H. pylori IgG serum antigen was positive.  He was put on Prevpac type antibiotics and was told to continue his Prevacid for another 6 weeks.   Review of systems: Pertinent positive and negative review of systems were noted in the above HPI section. All other review negative.   Past Medical History:  Diagnosis Date  . Chronic kidney disease   . Colon polyps   . Diabetes mellitus without complication (HCC)    does not check cbg daily  . Hypertension   . Hyperthyroidism   . Renal calculi     Past Surgical History:   Procedure Laterality Date  . APPENDECTOMY     age 53  . CYSTOSCOPY    . LITHOTRIPSY     4-5 times    Current Outpatient Medications  Medication Sig Dispense Refill  . amLODipine (NORVASC) 2.5 MG tablet Take 1 tablet (2.5 mg total) by mouth daily. 90 tablet 3  . aspirin EC 81 MG tablet Take 1 tablet (81 mg total) by mouth daily. 90 tablet 3  . cholecalciferol (VITAMIN D) 1000 UNITS tablet Take 1,000 Units by mouth daily as needed (nutrition).     Arna Medici 112 MCG tablet TAKE 1 TABLET BY MOUTH IN THE MORNING 90 tablet 1  . metFORMIN (GLUCOPHAGE) 1000 MG tablet Take 1 tablet by mouth twice daily 180 tablet 3  . olmesartan-hydrochlorothiazide (BENICAR HCT) 40-12.5 MG tablet Take 1 tablet by mouth daily. 90 tablet 3  . ONE TOUCH ULTRA TEST test strip USE TEST STRIPS TO TEST BLOOD SUGAR ONCE DAILY  4  . pravastatin (PRAVACHOL) 20 MG tablet Take 1 tablet (20 mg total) by mouth daily. 90 tablet 3  . repaglinide (PRANDIN) 0.5 MG tablet Take 1 tablet (0.5 mg total) by mouth 3 (three) times daily before meals. 90 tablet 3  . nitroGLYCERIN (NITROSTAT) 0.4 MG SL tablet Place 1 tablet (0.4 mg total) under the tongue every 5 (five) minutes  as needed for chest pain. (Patient not taking: Reported on 10/24/2020) 50 tablet 3   No current facility-administered medications for this visit.    Allergies as of 10/24/2020 - Review Complete 10/24/2020  Allergen Reaction Noted  . Hydrochlorothiazide Other (See Comments) 07/07/2020  . Lipitor [atorvastatin]  05/26/2020  . Lisinopril  05/12/2015  . Pravachol [pravastatin] Other (See Comments) 07/07/2020    Family History  Problem Relation Age of Onset  . High Cholesterol Father   . Hypertension Father   . Varicose Veins Father   . Pancreatic cancer Father   . Kidney disease Father   . Diabetes Maternal Grandmother   . Heart disease Maternal Grandfather   . Lung cancer Maternal Grandfather   . Heart disease Paternal Grandmother   . Heart disease Mother    . Thyroid disease Mother   . Lupus Sister     Social History   Socioeconomic History  . Marital status: Married    Spouse name: Not on file  . Number of children: 3  . Years of education: Not on file  . Highest education level: Not on file  Occupational History  . Not on file  Tobacco Use  . Smoking status: Former Smoker    Packs/day: 1.00    Quit date: 10/25/2010    Years since quitting: 10.0  . Smokeless tobacco: Never Used  Vaping Use  . Vaping Use: Never used  Substance and Sexual Activity  . Alcohol use: Yes    Comment: occasional  . Drug use: No  . Sexual activity: Not on file  Other Topics Concern  . Not on file  Social History Narrative  . Not on file   Social Determinants of Health   Financial Resource Strain: Low Risk   . Difficulty of Paying Living Expenses: Not hard at all  Food Insecurity: No Food Insecurity  . Worried About Charity fundraiser in the Last Year: Never true  . Ran Out of Food in the Last Year: Never true  Transportation Needs: No Transportation Needs  . Lack of Transportation (Medical): No  . Lack of Transportation (Non-Medical): No  Physical Activity: Inactive  . Days of Exercise per Week: 0 days  . Minutes of Exercise per Session: 0 min  Stress: No Stress Concern Present  . Feeling of Stress : Not at all  Social Connections: Unknown  . Frequency of Communication with Friends and Family: More than three times a week  . Frequency of Social Gatherings with Friends and Family: More than three times a week  . Attends Religious Services: Patient refused  . Active Member of Clubs or Organizations: Patient refused  . Attends Archivist Meetings: Patient refused  . Marital Status: Married  Human resources officer Violence: Not on file     Physical Exam: BP (!) 148/80 (BP Location: Left Arm, Patient Position: Sitting, Cuff Size: Normal)   Pulse 64   Ht 5\' 5"  (1.651 m) Comment: height measured without shoes  Wt 153 lb (69.4 kg)    BMI 25.46 kg/m  Constitutional: generally well-appearing Psychiatric: alert and oriented x3 Eyes: extraocular movements intact Mouth: oral pharynx moist, no lesions Neck: supple no lymphadenopathy Cardiovascular: heart regular rate and rhythm Lungs: clear to auscultation bilaterally Abdomen: soft, nontender, nondistended, no obvious ascites, no peritoneal signs, normal bowel sounds Extremities: no lower extremity edema bilaterally Skin: no lesions on visible extremities   Assessment and plan: 70 y.o. male with mild constipation, personal history of colon polyps  First we  will get records from his previous colonoscopy at [facility] gastroenterology, Dr. Michail Sermon.  Possibly his other colonoscopies were done in the same facility.  Neither he nor his wife are exactly sure about that.  After I see those records I will advise him on timing of next colonoscopy for polyp surveillance or screening.  He has mild chronic constipation.  I recommended that he try fiber supplements with Citrucel powder, large glass of water every morning.  He will return to see me in 2 months to report on his response.     Please see the "Patient Instructions" section for addition details about the plan.   Owens Loffler, MD [facility] 10/24/2020, 8:51 AM  Cc: Cassandria Anger, MD  Total time on date of encounter was 45  minutes (this included time spent preparing to see the patient reviewing records; obtaining and/or reviewing separately obtained history; performing a medically appropriate exam and/or evaluation; counseling and educating the patient and family if present; ordering medications, tests or procedures if applicable; and documenting clinical information in the health record).

## 2020-10-24 NOTE — Patient Instructions (Signed)
If you are age 70 or older, your body mass index should be between 23-30. Your Body mass index is 25.46 kg/m. If this is out of the aforementioned range listed, please consider follow up with your Primary Care Provider.  Please start taking citrucel (orange flavored) powder fiber supplement.  This may cause some bloating at first but that usually goes away. Begin with a small spoonful and work your way up to a large, heaping spoonful daily over a week.  You are scheduled to follow up on 12-31-2020 at 8:50am.  We will attempt to obtain records from [place].  Thank you for entrusting me with your care and choosing [facility].  Dr Ardis Hughs

## 2020-10-27 ENCOUNTER — Ambulatory Visit: Payer: HMO | Admitting: Internal Medicine

## 2020-10-27 ENCOUNTER — Ambulatory Visit: Payer: HMO

## 2020-10-29 ENCOUNTER — Ambulatory Visit: Payer: HMO | Admitting: Physician Assistant

## 2020-10-31 IMAGING — MR MR ABDOMEN WO/W CM
10 of 19 series · 20 of 48 positions shown · IV contrast (gadavist)
Comparison: CT AP 06/11/2019

CLINICAL DATA: Follow-up kidney lesion

EXAM:
MRI ABDOMEN WITHOUT AND WITH CONTRAST
TECHNIQUE: Multiplanar multisequence MR imaging of the abdomen was performed
both before and after the administration of intravenous contrast.
CONTRAST:  7mL GADAVIST GADOBUTROL 1 MMOL/ML IV SOLN

[Series 1: 3 plane non · axial · 8.0mm · 0.78mm/px · 1 of 13 slices shown]
[im 1/13]
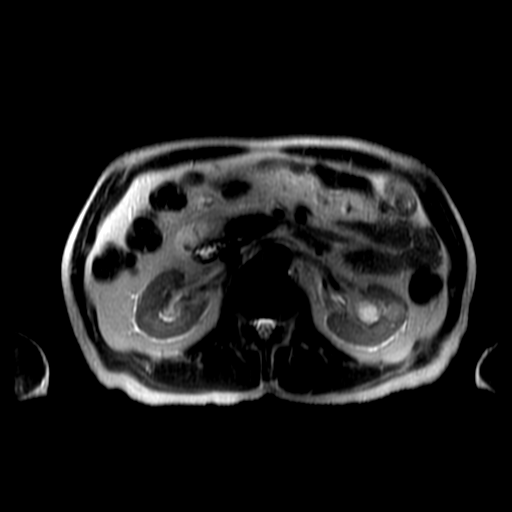

[Series 3: DWI b500 · axial · 6.0mm · 1.48mm/px · z∈[-147,+134]mm · 2 of 74 slices shown]
[im 1/74]
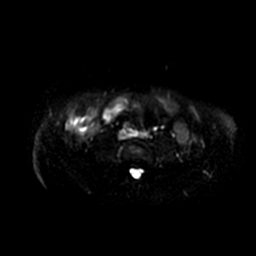
[im 74/74]
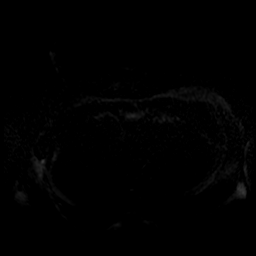

[Series 4: T2 fat-sat · axial · 5.0mm · 0.78mm/px · z∈[-150,+110]mm · 2 of 53 slices shown]
[im 1/53]
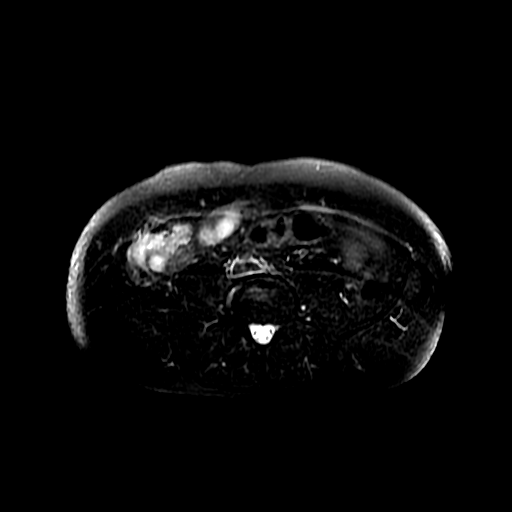
[im 53/53]
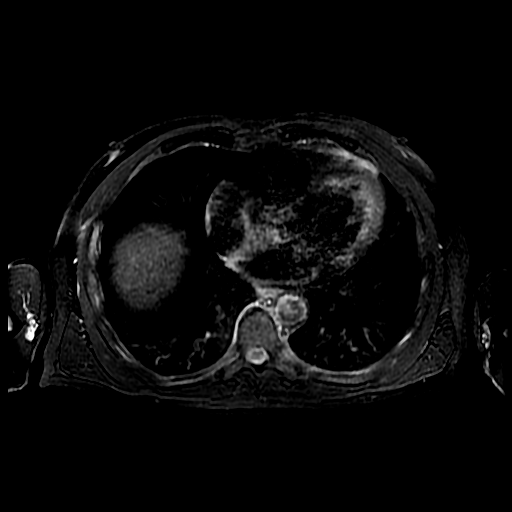

[Series 5: T2 · axial · 5.0mm · 0.78mm/px · z∈[-150,+110]mm · 2 of 53 slices shown (1 of 2)]
[im 1/53]
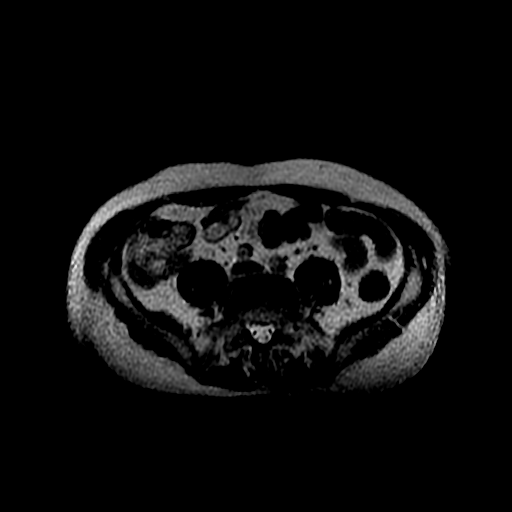
[im 53/53]
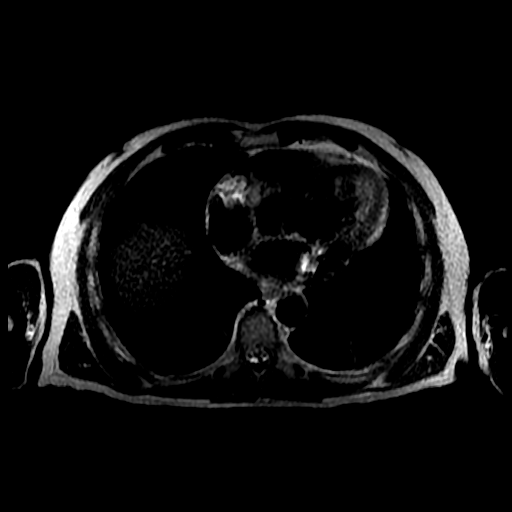

[Series 6: T2 · coronal · 5.0mm · 0.78mm/px · 2 of 47 slices shown (2 of 2)]
[im 1/47]
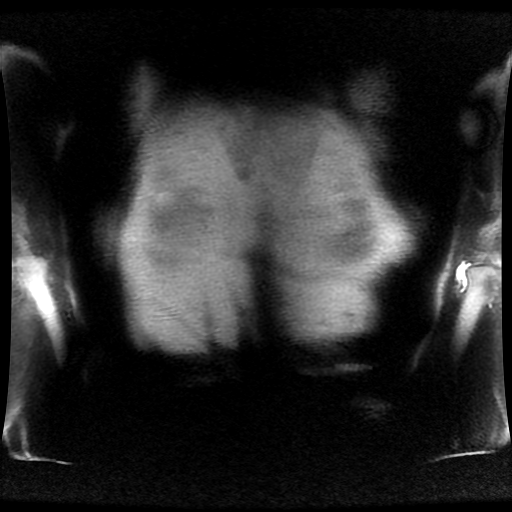
[im 47/47]
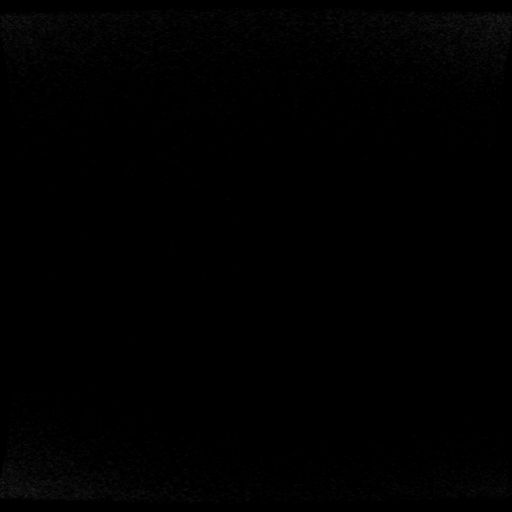

[Series 7: bSSFP · axial · 5.0mm · 0.78mm/px · z∈[-150,+110]mm · 2 of 53 slices shown]
[im 1/53]
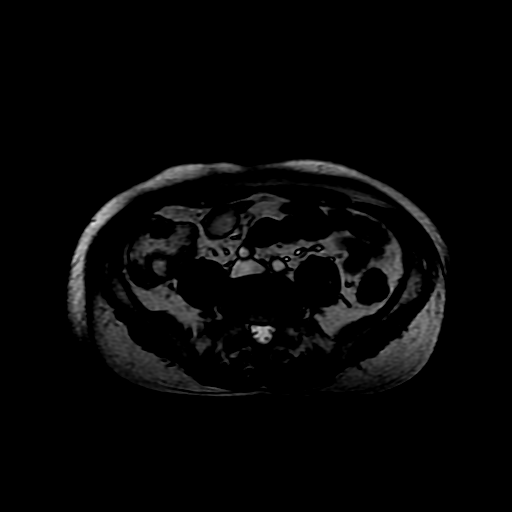
[im 53/53]
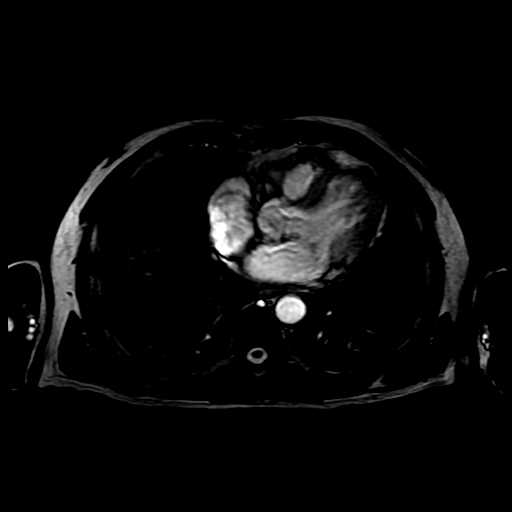

[Series 8: ax dualecho bh · axial · 5.0mm · 0.78mm/px · z∈[-138,+97]mm · 3 of 96 slices shown]
[im 1/96]
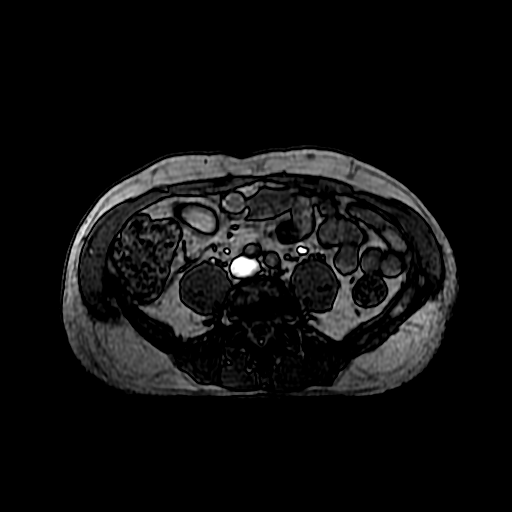
[im 48/96]
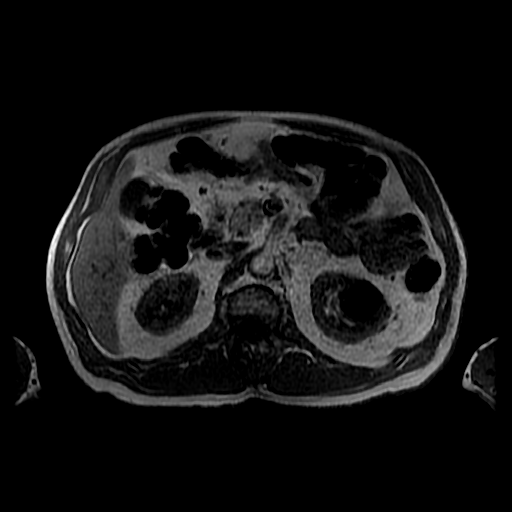
[im 96/96]
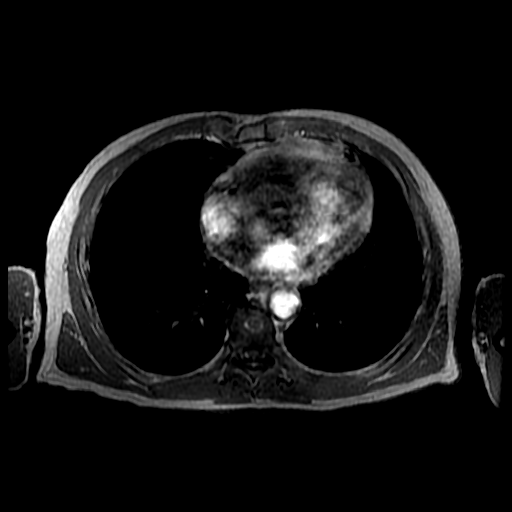

[Series 300: DWI · axial · 6.0mm · 1.48mm/px · 1 of 37 slices shown]
[im 1/37]
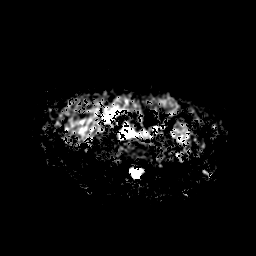

[Series 900: T1 dynamic · axial · 5.0mm · 0.78mm/px · z∈[-148,+90]mm · 3 of 96 slices shown (1 of 2)]
[im 1/96]
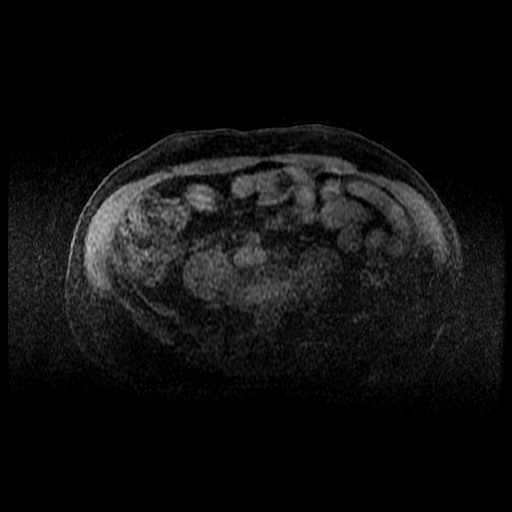
[im 48/96]
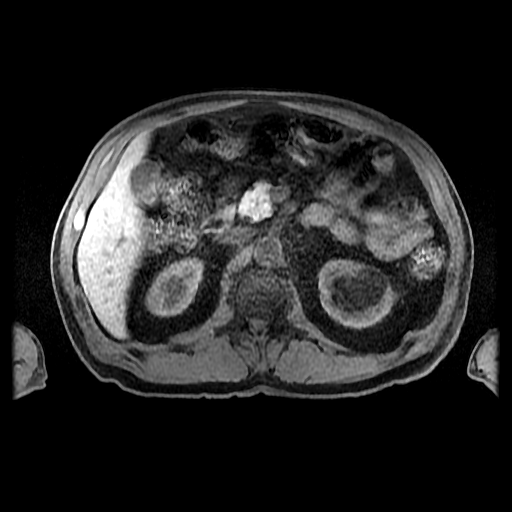
[im 96/96]
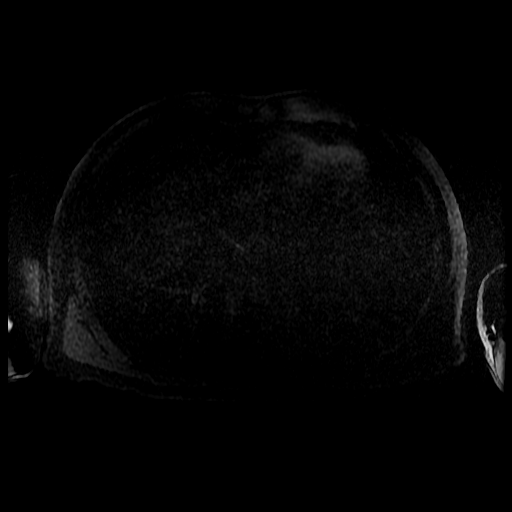

[Series 901: T1 dynamic · axial · 5.0mm · 0.78mm/px · z∈[-148,-30]mm · 2 of 96 slices shown (2 of 2)]
[im 1/96]
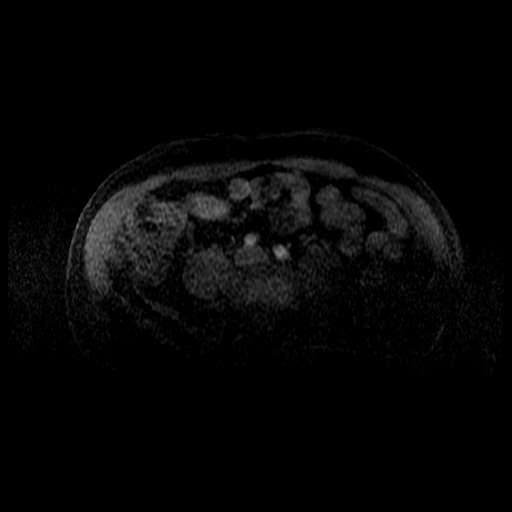
[im 48/96]
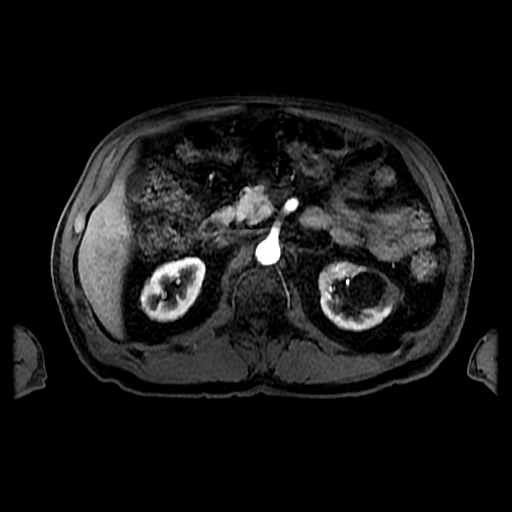

[20 of 48 positions shown; findings below may reference images not displayed]

FINDINGS: Lower chest: No acute findings.

Hepatobiliary: There is no suspicious liver abnormality identified.
No abnormal enhancement. The gallbladder is normal. No gallstones or
gallbladder wall thickening. No biliary dilatation.

Pancreas: No mass, inflammatory changes, or other parenchymal
abnormality identified.

Spleen:  Within normal limits in size and appearance.

Adrenals/Urinary Tract:  Normal adrenal glands.

Small cyst within anterior cortex of right lower pole measures 9 mm.

Three adjacent cysts are identified within the upper pole of left
kidney. The largest measures 3.5 cm, image [DATE]. Corresponding to
the complex lesion identified on CT is a well-circumscribed cortical
based, lateral upper pole lesion which is T1 isointense and T2
hyperintense. There is no measurable enhancement within this lesion
between the pre and post-contrast images. On the subtraction images
no enhancement is identified within this structure. This is
compatible with a Bosniak category 2 lesion.

Several small cystic lesions within the inferior pole of left kidney
are too small to reliably characterize but also likely represent
Bosniak category 1 lesions.

No solid enhancing kidney lesions identified at this time. No
hydronephrosis.

Stomach/Bowel: Visualized portions within the abdomen are
unremarkable.

Vascular/Lymphatic: Aortic atherosclerosis. No abdominopelvic
adenopathy identified.

Other:  No free fluid or fluid collections

Musculoskeletal: No suspicious bone lesions identified.
IMPRESSION: 1. Bilateral kidney cysts are identified compatible with Bosniak
category 1 and 2 kidney cysts. The mildly complex lesion within the
lateral cortex of the left kidney does not exhibit enhancement
postcontrast and is compatible with a Bosniak category 2 lesion.
2.  Aortic Atherosclerosis ([id number]-JL8.8).

## 2020-11-05 ENCOUNTER — Telehealth: Payer: Self-pay | Admitting: Gastroenterology

## 2020-11-05 NOTE — Telephone Encounter (Signed)
I reviewed outside records.  Colonoscopy March 2018, Dr. Michail Sermon at [facility] gastroenterology.  Indication "personal history of colonic polyps.  Last colonoscopy October 2012" findings internal hemorrhoids.  The examination was otherwise normal.  Dr. Michail Sermon recommended that the patient have repeat colon cancer screening with colonoscopy at 10-year interval.  Please let the patient know that I reviewed his 2018 colonoscopy with Dr. Michail Sermon at [facility] gastroenterology.  No polyps were found.  Dr. Michail Sermon recommended repeat colonoscopy at 10-year interval.  I agree with that recommendation.  Please put him in for recall colonoscopy March 2028.  Thank you

## 2020-11-05 NOTE — Telephone Encounter (Signed)
The pt has been advised of the recall recommendation.  The pt has been advised of the information and verbalized understanding.

## 2020-11-05 NOTE — Telephone Encounter (Signed)
Recall entered  Left message on machine to call back  

## 2020-12-03 ENCOUNTER — Telehealth: Payer: Self-pay | Admitting: Cardiology

## 2020-12-03 ENCOUNTER — Ambulatory Visit (INDEPENDENT_AMBULATORY_CARE_PROVIDER_SITE_OTHER): Payer: HMO | Admitting: Internal Medicine

## 2020-12-03 ENCOUNTER — Encounter: Payer: Self-pay | Admitting: Internal Medicine

## 2020-12-03 ENCOUNTER — Other Ambulatory Visit: Payer: Self-pay

## 2020-12-03 DIAGNOSIS — E118 Type 2 diabetes mellitus with unspecified complications: Secondary | ICD-10-CM | POA: Diagnosis not present

## 2020-12-03 DIAGNOSIS — K219 Gastro-esophageal reflux disease without esophagitis: Secondary | ICD-10-CM | POA: Diagnosis not present

## 2020-12-03 DIAGNOSIS — N1831 Chronic kidney disease, stage 3a: Secondary | ICD-10-CM | POA: Diagnosis not present

## 2020-12-03 DIAGNOSIS — E1159 Type 2 diabetes mellitus with other circulatory complications: Secondary | ICD-10-CM

## 2020-12-03 DIAGNOSIS — I209 Angina pectoris, unspecified: Secondary | ICD-10-CM

## 2020-12-03 DIAGNOSIS — A048 Other specified bacterial intestinal infections: Secondary | ICD-10-CM

## 2020-12-03 DIAGNOSIS — D485 Neoplasm of uncertain behavior of skin: Secondary | ICD-10-CM

## 2020-12-03 DIAGNOSIS — I700 Atherosclerosis of aorta: Secondary | ICD-10-CM | POA: Insufficient documentation

## 2020-12-03 DIAGNOSIS — I25118 Atherosclerotic heart disease of native coronary artery with other forms of angina pectoris: Secondary | ICD-10-CM | POA: Diagnosis not present

## 2020-12-03 DIAGNOSIS — E0590 Thyrotoxicosis, unspecified without thyrotoxic crisis or storm: Secondary | ICD-10-CM | POA: Diagnosis not present

## 2020-12-03 DIAGNOSIS — I2510 Atherosclerotic heart disease of native coronary artery without angina pectoris: Secondary | ICD-10-CM | POA: Insufficient documentation

## 2020-12-03 LAB — LIPID PANEL
Cholesterol: 219 mg/dL — ABNORMAL HIGH (ref 0–200)
HDL: 47.5 mg/dL (ref >39.00)
LDL Cholesterol: 137 mg/dL — ABNORMAL HIGH (ref 0–99)
NonHDL: 171.88
Total CHOL/HDL Ratio: 5
Triglycerides: 175 mg/dL — ABNORMAL HIGH (ref 0.0–149.0)
VLDL: 35 mg/dL (ref 0.0–40.0)

## 2020-12-03 LAB — CBC WITH DIFFERENTIAL/PLATELET
Basophils Absolute: 0.1 10*3/uL (ref 0.0–0.1)
Basophils Relative: 0.9 % (ref 0.0–3.0)
Eosinophils Absolute: 0.2 10*3/uL (ref 0.0–0.7)
Eosinophils Relative: 3.7 % (ref 0.0–5.0)
HCT: 39.9 % (ref 39.0–52.0)
Hemoglobin: 13.7 g/dL (ref 13.0–17.0)
Lymphocytes Relative: 44.5 % (ref 12.0–46.0)
Lymphs Abs: 2.7 10*3/uL (ref 0.7–4.0)
MCHC: 34.3 g/dL (ref 30.0–36.0)
MCV: 90.4 fl (ref 78.0–100.0)
Monocytes Absolute: 0.5 10*3/uL (ref 0.1–1.0)
Monocytes Relative: 7.9 % (ref 3.0–12.0)
Neutro Abs: 2.6 10*3/uL (ref 1.4–7.7)
Neutrophils Relative %: 43 % (ref 43.0–77.0)
Platelets: 221 10*3/uL (ref 150.0–400.0)
RBC: 4.41 Mil/uL (ref 4.22–5.81)
RDW: 13.2 % (ref 11.5–15.5)
WBC: 6 10*3/uL (ref 4.0–10.5)

## 2020-12-03 LAB — COMPREHENSIVE METABOLIC PANEL
ALT: 17 U/L (ref 0–53)
AST: 17 U/L (ref 0–37)
Albumin: 4.4 g/dL (ref 3.5–5.2)
Alkaline Phosphatase: 52 U/L (ref 39–117)
BUN: 25 mg/dL — ABNORMAL HIGH (ref 6–23)
CO2: 27 mEq/L (ref 19–32)
Calcium: 10.4 mg/dL (ref 8.4–10.5)
Chloride: 101 mEq/L (ref 96–112)
Creatinine, Ser: 1.12 mg/dL (ref 0.40–1.50)
GFR: 66.66 mL/min (ref >60.00)
Glucose, Bld: 205 mg/dL — ABNORMAL HIGH (ref 70–99)
Potassium: 4.1 mEq/L (ref 3.5–5.1)
Sodium: 139 mEq/L (ref 135–145)
Total Bilirubin: 0.4 mg/dL (ref 0.2–1.2)
Total Protein: 7.1 g/dL (ref 6.0–8.3)

## 2020-12-03 LAB — TSH: TSH: 0.55 u[IU]/mL (ref 0.35–4.50)

## 2020-12-03 LAB — HEMOGLOBIN A1C: Hgb A1c MFr Bld: 7 % — ABNORMAL HIGH (ref 4.6–6.5)

## 2020-12-03 NOTE — Assessment & Plan Note (Signed)
Card consult SL NTG prn Avoid etream exertion, overheating etc

## 2020-12-03 NOTE — Assessment & Plan Note (Addendum)
Hydrate well Avoid extream exertion, overheating etc

## 2020-12-03 NOTE — Addendum Note (Signed)
Addended by: Boris Lown B on: 12/03/2020 08:56 AM   Modules accepted: Orders

## 2020-12-03 NOTE — Assessment & Plan Note (Signed)
Check TSH 

## 2020-12-03 NOTE — Assessment & Plan Note (Signed)
Treated w/abx

## 2020-12-03 NOTE — Assessment & Plan Note (Signed)
Mole R thigh, L chest Skin bx w/me

## 2020-12-03 NOTE — Assessment & Plan Note (Addendum)
Much better after H pylori treatment in 2022 Cont w/Pepcid prn

## 2020-12-03 NOTE — Assessment & Plan Note (Signed)
On Pravastatin 

## 2020-12-03 NOTE — Assessment & Plan Note (Signed)
On Prandin 

## 2020-12-03 NOTE — Assessment & Plan Note (Signed)
There is some chest discomfort w/severe exertion Card ref NTG prn

## 2020-12-03 NOTE — Telephone Encounter (Signed)
Patient has urgent referral and needs to be moved up per doctor.

## 2020-12-03 NOTE — Progress Notes (Signed)
Subjective:  Patient ID: Ryan Ferguson, male    DOB: 1950/12/29  Age: 70 y.o. MRN: [id number]  CC: Follow-up (3 month f/u)   HPI ETHERIDGE GEIL presents for DM, HTN, GERD  Outpatient Medications Prior to Visit  Medication Sig Dispense Refill   amLODipine (NORVASC) 2.5 MG tablet Take 1 tablet (2.5 mg total) by mouth daily. 90 tablet 3   aspirin EC 81 MG tablet Take 1 tablet (81 mg total) by mouth daily. 90 tablet 3   cholecalciferol (VITAMIN D) 1000 UNITS tablet Take 1,000 Units by mouth daily as needed (nutrition).      EUTHYROX 112 MCG tablet TAKE 1 TABLET BY MOUTH IN THE MORNING 90 tablet 1   metFORMIN (GLUCOPHAGE) 1000 MG tablet Take 1 tablet by mouth twice daily 180 tablet 3   olmesartan-hydrochlorothiazide (BENICAR HCT) 40-12.5 MG tablet Take 1 tablet by mouth daily. 90 tablet 3   ONE TOUCH ULTRA TEST test strip USE TEST STRIPS TO TEST BLOOD SUGAR ONCE DAILY  4   pravastatin (PRAVACHOL) 20 MG tablet Take 1 tablet (20 mg total) by mouth daily. 90 tablet 3   repaglinide (PRANDIN) 0.5 MG tablet Take 1 tablet (0.5 mg total) by mouth 3 (three) times daily before meals. 90 tablet 3   nitroGLYCERIN (NITROSTAT) 0.4 MG SL tablet Place 1 tablet (0.4 mg total) under the tongue every 5 (five) minutes as needed for chest pain. (Patient not taking: No sig reported) 50 tablet 3   No facility-administered medications prior to visit.    ROS: Review of Systems  Constitutional:  Negative for appetite change, fatigue and unexpected weight change.  HENT:  Negative for congestion, nosebleeds, sneezing, sore throat and trouble swallowing.   Eyes:  Negative for itching and visual disturbance.  Respiratory:  Negative for cough.   Cardiovascular:  Positive for chest pain. Negative for palpitations and leg swelling.  Gastrointestinal:  Negative for abdominal distention, blood in stool, diarrhea and nausea.  Genitourinary:  Negative for frequency and hematuria.  Musculoskeletal:  Negative for back  pain, gait problem, joint swelling and neck pain.  Skin:  Negative for rash.  Neurological:  Negative for dizziness, tremors, speech difficulty and weakness.  Psychiatric/Behavioral:  Negative for agitation, dysphoric mood, sleep disturbance and suicidal ideas. The patient is not nervous/anxious.    Objective:  BP (!) 142/70 (BP Location: Left Arm)   Pulse 64   Temp 98.4 F (36.9 C) (Oral)   Ht 5\' 5"  (1.651 m)   Wt 150 lb (68 kg)   SpO2 96%   BMI 24.96 kg/m   BP Readings from Last 3 Encounters:  12/03/20 (!) 142/70  10/24/20 (!) 148/80  10/02/20 134/72    Wt Readings from Last 3 Encounters:  12/03/20 150 lb (68 kg)  10/24/20 153 lb (69.4 kg)  07/28/20 153 lb 12.8 oz (69.8 kg)    Physical Exam Constitutional:      General: He is not in acute distress.    Appearance: He is well-developed.     Comments: NAD  Eyes:     Conjunctiva/sclera: Conjunctivae normal.     Pupils: Pupils are equal, round, and reactive to light.  Neck:     Thyroid: No thyromegaly.     Vascular: No JVD.  Cardiovascular:     Rate and Rhythm: Normal rate and regular rhythm.     Heart sounds: Normal heart sounds. No murmur heard.   No friction rub. No gallop.  Pulmonary:     Effort: Pulmonary  effort is normal. No respiratory distress.     Breath sounds: Normal breath sounds. No wheezing or rales.  Chest:     Chest wall: No tenderness.  Abdominal:     General: Bowel sounds are normal. There is no distension.     Palpations: Abdomen is soft. There is no mass.     Tenderness: There is no abdominal tenderness. There is no guarding or rebound.  Musculoskeletal:        General: No tenderness. Normal range of motion.     Cervical back: Normal range of motion.  Lymphadenopathy:     Cervical: No cervical adenopathy.  Skin:    General: Skin is warm and dry.     Findings: No rash.  Neurological:     Mental Status: He is alert and oriented to person, place, and time.     Cranial Nerves: No cranial  nerve deficit.     Motor: No abnormal muscle tone.     Coordination: Coordination normal.     Gait: Gait normal.     Deep Tendon Reflexes: Reflexes are normal and symmetric.  Psychiatric:        Behavior: Behavior normal.        Thought Content: Thought content normal.        Judgment: Judgment normal.   Mole R thigh, L chest  Lab Results  Component Value Date   WBC 5.4 03/26/2020   HGB 14.2 03/26/2020   HCT 41.6 03/26/2020   PLT 217.0 03/26/2020   GLUCOSE 163 (H) 07/28/2020   CHOL 197 03/26/2020   TRIG 120.0 03/26/2020   HDL 52.70 03/26/2020   LDLCALC 120 (H) 03/26/2020   ALT 20 07/28/2020   AST 14 07/28/2020   NA 137 07/28/2020   K 3.6 07/28/2020   CL 98 07/28/2020   CREATININE 1.02 07/28/2020   BUN 21 07/28/2020   CO2 29 07/28/2020   TSH 0.88 03/26/2020   PSA 2.17 03/26/2020   HGBA1C 7.4 (H) 07/28/2020   MICROALBUR 2.3 (H) 03/26/2020    MR ABDOMEN WWO CONTRAST  Result Date: 07/13/2019 CLINICAL DATA:  Follow-up kidney lesion EXAM: MRI ABDOMEN WITHOUT AND WITH CONTRAST TECHNIQUE: Multiplanar multisequence MR imaging of the abdomen was performed both before and after the administration of intravenous contrast. CONTRAST:  35mL GADAVIST GADOBUTROL 1 MMOL/ML IV SOLN COMPARISON:  CT AP 06/11/2019 FINDINGS: Lower chest: No acute findings. Hepatobiliary: There is no suspicious liver abnormality identified. No abnormal enhancement. The gallbladder is normal. No gallstones or gallbladder wall thickening. No biliary dilatation. Pancreas: No mass, inflammatory changes, or other parenchymal abnormality identified. Spleen:  Within normal limits in size and appearance. Adrenals/Urinary Tract:  Normal adrenal glands. Small cyst within anterior cortex of right lower pole measures 9 mm. Three adjacent cysts are identified within the upper pole of left kidney. The largest measures 3.5 cm, image 16/6. Corresponding to the complex lesion identified on CT is a well-circumscribed cortical based,  lateral upper pole lesion which is T1 isointense and T2 hyperintense. There is no measurable enhancement within this lesion between the pre and post-contrast images. On the subtraction images no enhancement is identified within this structure. This is compatible with a Bosniak category 2 lesion. Several small cystic lesions within the inferior pole of left kidney are too small to reliably characterize but also likely represent Bosniak category 1 lesions. No solid enhancing kidney lesions identified at this time. No hydronephrosis. Stomach/Bowel: Visualized portions within the abdomen are unremarkable. Vascular/Lymphatic: Aortic atherosclerosis. No abdominopelvic adenopathy identified.  Other:  No free fluid or fluid collections Musculoskeletal: No suspicious bone lesions identified. IMPRESSION: 1. Bilateral kidney cysts are identified compatible with Bosniak category 1 and 2 kidney cysts. The mildly complex lesion within the lateral cortex of the left kidney does not exhibit enhancement postcontrast and is compatible with a Bosniak category 2 lesion. 2.  Aortic Atherosclerosis (ICD10-I70.0). Electronically Signed   By: Kerby Moors M.D.   On: 07/13/2019 08:48    Assessment & Plan:     Walker Kehr, MD

## 2020-12-03 NOTE — Telephone Encounter (Signed)
Scheduler called triage and advised RN pt has been scheduled 01/22/2021 with Dr Candee Furbish.  Scheduler states pt has urgent referral and appointment needs to be moved up per the referring provider.  Scheduler states no earlier appointments available with APP's. RN advised scheduler he will need to forward message to Dr Marlou Porch and his RN for approval to schedule pt sooner.  Scheduler verbalized understanding and thanked Therapist, sports.

## 2020-12-04 NOTE — Telephone Encounter (Signed)
Per Dr Marlou Porch request to be placed on his schedule sooner denied as pt has refused to see APP in the past.  Dr Marlou Porch requests pt be put on DOD schedule.  First available DOD appt available is acute spot on 12/15/2020 with Dr Irish Lack.  RN requested [place], RN Supervisor to utilize this appointment date and time and was advised this was a same day appointment and could not be used.  Advised to offer pt APP, Cecilie Kicks appointment on 12/24/2020 or 12/31/2020 when Dr Marlou Porch is in the office.

## 2020-12-04 NOTE — Telephone Encounter (Signed)
Spoke with pt's wife, DPR and advised earliest availability is with Cecilie Kicks, NP on 12/31/2020.  Dr Marlou Porch will be in clinic that day as well.  NP would be able to confer with Dr Marlou Porch if needed.  Pt's wife states pt refuses to see APP and will only see MD and requests to be placed on cancellation list.  Pt's wife states if need be pt will keep 01/22/2021 appointment scheduled with Dr Marlou Porch.  Pt has been placed on wait list for earlier appointment.  Pt's wife verbalizes understanding and thanked Therapist, sports for the phone call.

## 2020-12-05 ENCOUNTER — Encounter: Payer: Self-pay | Admitting: Cardiology

## 2020-12-05 NOTE — Telephone Encounter (Signed)
error 

## 2020-12-10 NOTE — Telephone Encounter (Signed)
Pt has been scheduled to be seen by Dr Curt Bears on Friday 6/24. He needs to be scheduled for cardiac cath per Dr Candee Furbish d/t elevated Coronary Calcium score.  Wife is aware of appt date,time and location.  She was very grateful for the call and appt.

## 2020-12-12 ENCOUNTER — Ambulatory Visit: Payer: HMO | Admitting: Cardiology

## 2020-12-12 ENCOUNTER — Encounter: Payer: Self-pay | Admitting: Cardiology

## 2020-12-12 ENCOUNTER — Other Ambulatory Visit: Payer: Self-pay

## 2020-12-12 VITALS — BP 154/64 | HR 64 | Ht 65.0 in | Wt 156.0 lb

## 2020-12-12 DIAGNOSIS — R0789 Other chest pain: Secondary | ICD-10-CM | POA: Diagnosis not present

## 2020-12-12 DIAGNOSIS — I25119 Atherosclerotic heart disease of native coronary artery with unspecified angina pectoris: Secondary | ICD-10-CM

## 2020-12-12 NOTE — Progress Notes (Signed)
Electrophysiology Office Note   Date:  12/12/2020   ID:  Ryan Ferguson, DOB May 29, 1951, MRN [id number]  PCP:  Cassandria Anger, MD  Cardiologist:  Marlou Porch    Chief Complaint: chest pain   History of Present Illness: Ryan Ferguson is a 70 y.o. male who is being seen today for the evaluation of chest pain at the request of Plotnikov, Evie Lacks, MD.   He has a history of CKD, type 2 diabetes, hypertension.  He was evaluated by general cardiology after CT showed coronary calcifications.  He is not having chest pain, fevers or chills.  This was in 2020.  He has a 50-pack-year history.  Today, he denies symptoms of palpitations, shortness of breath, orthopnea, PND, lower extremity edema, claudication, dizziness, presyncope, syncope, bleeding, or neurologic sequela. The patient is tolerating medications without difficulties.  Pain.  His chest hurts in the center of his chest.  This occurs when he walks his dog.  This is happened 4 separate times.  He has stopped for about 5 minutes and the chest pain improves.  There is no radiation.  He also has nausea.  He says that the pain feels like something is going to burst out of his chest when it occurs.   Past Medical History:  Diagnosis Date   Chronic kidney disease    Colon polyps    Diabetes mellitus without complication ([place])    does not check cbg daily   Hypertension    Hyperthyroidism    Renal calculi    Past Surgical History:  Procedure Laterality Date   APPENDECTOMY     age 63   CYSTOSCOPY     LITHOTRIPSY     4-5 times     Current Outpatient Medications  Medication Sig Dispense Refill   amLODipine (NORVASC) 2.5 MG tablet Take 1 tablet (2.5 mg total) by mouth daily. 90 tablet 3   aspirin EC 81 MG tablet Take 1 tablet (81 mg total) by mouth daily. 90 tablet 3   cholecalciferol (VITAMIN D) 1000 UNITS tablet Take 1,000 Units by mouth daily as needed (nutrition).      EUTHYROX 112 MCG tablet TAKE 1 TABLET BY MOUTH IN THE  MORNING 90 tablet 1   metFORMIN (GLUCOPHAGE) 1000 MG tablet Take 1 tablet by mouth twice daily 180 tablet 3   nitroGLYCERIN (NITROSTAT) 0.4 MG SL tablet Place 1 tablet (0.4 mg total) under the tongue every 5 (five) minutes as needed for chest pain. 50 tablet 3   olmesartan-hydrochlorothiazide (BENICAR HCT) 40-12.5 MG tablet Take 1 tablet by mouth daily. 90 tablet 3   ONE TOUCH ULTRA TEST test strip USE TEST STRIPS TO TEST BLOOD SUGAR ONCE DAILY  4   pravastatin (PRAVACHOL) 20 MG tablet Take 1 tablet (20 mg total) by mouth daily. 90 tablet 3   repaglinide (PRANDIN) 0.5 MG tablet Take 1 tablet (0.5 mg total) by mouth 3 (three) times daily before meals. 90 tablet 3   No current facility-administered medications for this visit.    Allergies:   Hydrochlorothiazide, Lipitor [atorvastatin], Lisinopril, and Pravachol [pravastatin]   Social History:  The patient  reports that he quit smoking about 10 years ago. His smoking use included cigarettes. He smoked an average of 1.00 packs per day. He has never used smokeless tobacco. He reports current alcohol use. He reports that he does not use drugs.   Family History:  The patient's family history includes Diabetes in his maternal grandmother; Heart disease in his maternal  grandfather, mother, and paternal grandmother; High Cholesterol in his father; Hypertension in his father; Kidney disease in his father; Lung cancer in his maternal grandfather; Lupus in his sister; Pancreatic cancer in his father; Thyroid disease in his mother; Varicose Veins in his father.    ROS:  Please see the history of present illness.   Otherwise, review of systems is positive for none.   All other systems are reviewed and negative.    PHYSICAL EXAM: VS:  BP (!) 154/64   Pulse 64   Ht 5\' 5"  (1.651 m)   Wt 156 lb (70.8 kg)   BMI 25.96 kg/m  , BMI Body mass index is 25.96 kg/m. GEN: Well nourished, well developed, in no acute distress  HEENT: normal  Neck: no JVD, carotid  bruits, or masses Cardiac: RRR; no murmurs, rubs, or gallops,no edema  Respiratory:  clear to auscultation bilaterally, normal work of breathing GI: soft, nontender, nondistended, + BS MS: no deformity or atrophy  Skin: warm and dry Neuro:  Strength and sensation are intact Psych: euthymic mood, full affect  EKG:  EKG is ordered today. Personal review of the ekg ordered shows sinus rhythm, rate 64   Recent Labs: 12/03/2020: ALT 17; BUN 25; Creatinine, Ser 1.12; Hemoglobin 13.7; Platelets 221.0; Potassium 4.1; Sodium 139; TSH 0.55    Lipid Panel     Component Value Date/Time   CHOL 219 (H) 12/03/2020 0856   TRIG 175.0 (H) 12/03/2020 0856   HDL 47.50 12/03/2020 0856   CHOLHDL 5 12/03/2020 0856   VLDL 35.0 12/03/2020 0856   LDLCALC 137 (H) 12/03/2020 0856     Wt Readings from Last 3 Encounters:  12/12/20 156 lb (70.8 kg)  12/03/20 150 lb (68 kg)  10/24/20 153 lb (69.4 kg)      Other studies Reviewed: Additional studies/ records that were reviewed today include: Myoview 06/29/2019 Review of the above records today demonstrates:  Nuclear stress EF: 62%. There was no ST segment deviation noted during stress. The study is normal. This is a low risk study.    ASSESSMENT AND PLAN:  1.  Coronary artery disease with an elevated calcium score: Currently on aspirin, atorvastatin.  Currently having chest pain intermittently mainly with exertion.  He does have an elevated coronary calcium score.  We did discuss left heart catheterization versus coronary artery CT.  He would prefer to avoid invasive procedures at this time.  We Windel Keziah plan for coronary CTA.  He Lindalou Soltis need FFR as well.  2.  Hyperlipidemia: Goal LDL less than 70.  Continue high intensity statin per primary cardiology.  3.  Hypertension: Mildly elevated today.  Plan per primary physician.  Case discussed with primary cardiology  Current medicines are reviewed at length with the patient today.   The patient does not  have concerns regarding his medicines.  The following changes were made today:  none  Labs/ tests ordered today include:  Orders Placed This Encounter  Procedures   CT CORONARY MORPH W/CTA COR W/SCORE W/CA W/CM &/OR WO/CM   EKG 12-Lead     Disposition:   FU with Dr. Marlou Porch post CTA  Signed, Gayathri Futrell Meredith Leeds, MD  12/12/2020 4:09 PM     [place] [address] [phone] (office) [phone] (fax)

## 2020-12-12 NOTE — Patient Instructions (Signed)
Medication Instructions:  Your physician recommends that you continue on your current medications as directed. Please refer to the Current Medication list given to you today.  *If you need a refill on your cardiac medications before your next appointment, please call your pharmacy*   Lab Work: None ordered If you have labs (blood work) drawn today and your tests are completely normal, you will receive your results only by: [place] (if you have MyChart) OR A paper copy in the mail If you have any lab test that is abnormal or we need to change your treatment, we will call you to review the results.   Testing/Procedures: Your physician has requested that you have cardiac CTA. Cardiac computed tomography (CT) is a painless test that uses an x-ray machine to take clear, detailed pictures of your heart. For further information please visit [email or web address][email or web address]. Please follow instruction sheet as given.    Follow-Up: At [facility], you and your health needs are our priority.  As part of our continuing mission to provide you with exceptional heart care, we have created designated Provider Care Teams.  These Care Teams include your primary Cardiologist (physician) and Advanced Practice Providers (APPs -  Physician Assistants and Nurse Practitioners) who all work together to provide you with the care you need, when you need it.   Your next appointment:   After your cardiac CT  The format for your next appointment:   In Person  Provider:   Candee Furbish, MD    Thank you for choosing [facility]!!   Trinidad Curet, RN [phone]   Other Instructions  Your cardiac CT will be scheduled at one of the below locations:   [facility] [address] [place], [place] [address] [phone]  [place] [address] [place], [place] [address] [phone]  If scheduled at [facility], please  arrive at the [facility] main entrance (entrance A) of [facility] 30 minutes prior to test start time. Proceed to the [facility] Radiology Department (first floor) to check-in and test prep.  If scheduled at [facility], please arrive 15 mins early for check-in and test prep.  Please follow these instructions carefully (unless otherwise directed):  Hold all erectile dysfunction medications at least 3 days (72 hrs) prior to test.  On the Night Before the Test: Be sure to Drink plenty of water. Do not consume any caffeinated/decaffeinated beverages or chocolate 12 hours prior to your test. Do not take any antihistamines 12 hours prior to your test.   On the Day of the Test: Drink plenty of water until 1 hour prior to the test. Do not eat any food 4 hours prior to the test. You may take your regular medications prior to the test.  HOLD Furosemide/Hydrochlorothiazide morning of the test.       After the Test: Drink plenty of water. After receiving IV contrast, you may experience a mild flushed feeling. This is normal. On occasion, you may experience a mild rash up to 24 hours after the test. This is not dangerous. If this occurs, you can take Benadryl 25 mg and increase your fluid intake. If you experience trouble breathing, this can be serious. If it is severe call 911 IMMEDIATELY. If it is mild, please call our office. If you take any of these medications: Glipizide/Metformin, Avandament, Glucavance, please do not take 48 hours after completing test unless otherwise instructed.   Once  we have confirmed authorization from your insurance company, we will call you to set up a date and time for your test. Based on how quickly your insurance processes prior authorizations requests, please allow up to 4 weeks to be contacted for scheduling your Cardiac CT appointment. Be advised that routine Cardiac CT appointments could be scheduled as many as 8 weeks after  your provider has ordered it.  For non-scheduling related questions, please contact the cardiac imaging nurse navigator should you have any questions/concerns: Marchia Bond, Cardiac Imaging Nurse Navigator Gordy Clement, Cardiac Imaging Nurse Navigator [facility] Direct Office Dial: [phone]   For scheduling needs, including cancellations and rescheduling, please call [place], [phone].

## 2020-12-15 ENCOUNTER — Ambulatory Visit: Payer: HMO | Admitting: Interventional Cardiology

## 2020-12-25 ENCOUNTER — Telehealth (HOSPITAL_COMMUNITY): Payer: Self-pay | Admitting: Emergency Medicine

## 2020-12-25 NOTE — Telephone Encounter (Addendum)
Reaching out to patient to offer assistance regarding upcoming cardiac imaging study; pt verbalizes understanding of appt date/time, parking situation and where to check in, pre-test NPO status and medications ordered, and verified current allergies; name and call back number provided for further questions should they arise Ryan Bond RN [facility] [phone] office [phone] cell  Spoke with wife who read instruction page to me. Pt to take daily meds, holding metformin 48 hr post scan, holding olmesartan-HCTZ Denies iv issues Denies claustro Ryan Ferguson

## 2020-12-26 ENCOUNTER — Ambulatory Visit (HOSPITAL_COMMUNITY)
Admission: RE | Admit: 2020-12-26 | Discharge: 2020-12-26 | Disposition: A | Discharge status: Home / Self Care | Payer: HMO | Source: Ambulatory Visit | Attending: Internal Medicine | Admitting: Internal Medicine

## 2020-12-26 ENCOUNTER — Other Ambulatory Visit: Payer: Self-pay

## 2020-12-26 ENCOUNTER — Ambulatory Visit (HOSPITAL_COMMUNITY)
Admission: RE | Admit: 2020-12-26 | Discharge: 2020-12-26 | Disposition: A | Discharge status: Home / Self Care | Payer: HMO | Source: Ambulatory Visit | Attending: Cardiology | Admitting: Cardiology

## 2020-12-26 ENCOUNTER — Other Ambulatory Visit: Payer: Self-pay | Admitting: Internal Medicine

## 2020-12-26 DIAGNOSIS — R0789 Other chest pain: Secondary | ICD-10-CM | POA: Diagnosis not present

## 2020-12-26 DIAGNOSIS — I700 Atherosclerosis of aorta: Secondary | ICD-10-CM | POA: Diagnosis not present

## 2020-12-26 DIAGNOSIS — R931 Abnormal findings on diagnostic imaging of heart and coronary circulation: Secondary | ICD-10-CM | POA: Diagnosis not present

## 2020-12-26 DIAGNOSIS — I2510 Atherosclerotic heart disease of native coronary artery without angina pectoris: Secondary | ICD-10-CM | POA: Insufficient documentation

## 2020-12-26 MED ORDER — IOHEXOL 350 MG/ML SOLN
95.0000 mL | Freq: Once | INTRAVENOUS | Status: AC | PRN
  Administered 2020-12-26: 95 mL via INTRAVENOUS

## 2020-12-26 MED ORDER — NITROGLYCERIN 0.4 MG SL SUBL
SUBLINGUAL_TABLET | SUBLINGUAL | Status: AC
  Filled 2020-12-26: qty 2

## 2020-12-26 MED ORDER — NITROGLYCERIN 0.4 MG SL SUBL
0.8000 mg | SUBLINGUAL_TABLET | Freq: Once | SUBLINGUAL | Status: AC
  Administered 2020-12-26: 0.8 mg via SUBLINGUAL

## 2020-12-26 NOTE — Progress Notes (Signed)
Abnormal cardiac CT - will send for FFR. -Dr. Debara Pickett

## 2020-12-31 ENCOUNTER — Other Ambulatory Visit: Payer: Self-pay

## 2020-12-31 ENCOUNTER — Encounter: Payer: Self-pay | Admitting: Gastroenterology

## 2020-12-31 ENCOUNTER — Ambulatory Visit: Payer: HMO | Admitting: Gastroenterology

## 2020-12-31 VITALS — BP 148/64 | HR 64 | Ht 65.0 in | Wt 156.2 lb

## 2020-12-31 DIAGNOSIS — K219 Gastro-esophageal reflux disease without esophagitis: Secondary | ICD-10-CM | POA: Diagnosis not present

## 2020-12-31 DIAGNOSIS — K5900 Constipation, unspecified: Secondary | ICD-10-CM

## 2020-12-31 NOTE — Progress Notes (Signed)
Review of pertinent gastrointestinal problems: 1.  Chronic constipation.  Long-term patient of Dr. Michail Sermon at [facility] gastroenterology. Colonoscopy March 2018, Dr. Michail Sermon at [facility] gastroenterology.  Indication "personal history of colonic polyps.  Last colonoscopy October 2012" findings internal hemorrhoids.  The examination was otherwise normal.  Dr. Michail Sermon recommended that the patient have repeat colon cancer screening with colonoscopy at 10-year interval. 2.  Routine risk for colon cancer.  See above colonoscopy March 2018 Dr. Michail Sermon at [place] gastroenterology   HPI: This is a very pleasant 70 year old man  I last saw him a little over 2 months ago here as a new patient.  We get the records from his previous gastroenterologist.  See those above.  I recommended he start taking fiber supplement with a large glass of water every morning for his mild chronic constipation.  Blood work June 2022 shows normal CBC, hemoglobin A1c 7.0, normal complete metabolic profile except for BUN 25 and glucose 205, normal TSH  Since I last saw him he indeed tried Citrucel fiber supplement on an every day basis for several weeks.  This helped him to have a bowel movement every day but he was still bothered by the need to push and strain quite a bit.  He stopped taking the Citrucel 2 weeks ago.  He brought up another problem today but I have not talked with him about, he has a very intermittent mild pyrosis when bending over or working in the yard.  He has no dysphagia.  His weight is overall stable.  He has never tried anything for this heartburn.  This occurs once or twice a week at most.  ROS: complete GI ROS as described in HPI, all other review negative.  Constitutional:  No unintentional weight loss   Past Medical History:  Diagnosis Date   Chronic kidney disease    Colon polyps    Diabetes mellitus without complication ([place])    does not check cbg daily   Hypertension    Hyperthyroidism    Kidney  stones     Past Surgical History:  Procedure Laterality Date   APPENDECTOMY     age 1   CYSTOSCOPY     LITHOTRIPSY     4-5 times    Current Outpatient Medications  Medication Sig Dispense Refill   amLODipine (NORVASC) 2.5 MG tablet Take 1 tablet (2.5 mg total) by mouth daily. 90 tablet 3   aspirin EC 81 MG tablet Take 1 tablet (81 mg total) by mouth daily. 90 tablet 3   cholecalciferol (VITAMIN D) 1000 UNITS tablet Take 1,000 Units by mouth daily as needed (nutrition).      EUTHYROX 112 MCG tablet TAKE 1 TABLET BY MOUTH IN THE MORNING 90 tablet 1   metFORMIN (GLUCOPHAGE) 1000 MG tablet Take 1 tablet by mouth twice daily 180 tablet 3   nitroGLYCERIN (NITROSTAT) 0.4 MG SL tablet Place 1 tablet (0.4 mg total) under the tongue every 5 (five) minutes as needed for chest pain. 50 tablet 3   olmesartan-hydrochlorothiazide (BENICAR HCT) 40-12.5 MG tablet Take 1 tablet by mouth daily. 90 tablet 3   ONE TOUCH ULTRA TEST test strip USE TEST STRIPS TO TEST BLOOD SUGAR ONCE DAILY  4   pravastatin (PRAVACHOL) 20 MG tablet Take 1 tablet (20 mg total) by mouth daily. 90 tablet 3   repaglinide (PRANDIN) 0.5 MG tablet Take 1 tablet (0.5 mg total) by mouth 3 (three) times daily before meals. 90 tablet 3   No current facility-administered medications for  this visit.    Allergies as of 12/31/2020 - Review Complete 12/31/2020  Allergen Reaction Noted   Hydrochlorothiazide Other (See Comments) 07/07/2020   Lipitor [atorvastatin]  05/26/2020   Lisinopril  05/12/2015   Pravachol [pravastatin] Other (See Comments) 07/07/2020    Family History  Problem Relation Age of Onset   High Cholesterol Father    Hypertension Father    Varicose Veins Father    Pancreatic cancer Father    Kidney disease Father    Diabetes Maternal Grandmother    Heart disease Maternal Grandfather    Lung cancer Maternal Grandfather    Heart disease Paternal Grandmother    Heart disease Mother    Thyroid disease Mother     Lupus Sister     Social History   Socioeconomic History   Marital status: Married    Spouse name: Not on file   Number of children: 3   Years of education: Not on file   Highest education level: Not on file  Occupational History   Not on file  Tobacco Use   Smoking status: Former    Packs/day: 1.00    Pack years: 0.00    Types: Cigarettes    Quit date: 10/25/2010    Years since quitting: 10.1   Smokeless tobacco: Never  Vaping Use   Vaping Use: Never used  Substance and Sexual Activity   Alcohol use: Yes    Comment: occasional   Drug use: No   Sexual activity: Not on file  Other Topics Concern   Not on file  Social History Narrative   Not on file   Social Determinants of Health   Financial Resource Strain: Low Risk    Difficulty of Paying Living Expenses: Not hard at all  Food Insecurity: Not on file  Transportation Needs: No Transportation Needs   Lack of Transportation (Medical): No   Lack of Transportation (Non-Medical): No  Physical Activity: Inactive   Days of Exercise per Week: 0 days   Minutes of Exercise per Session: 0 min  Stress: No Stress Concern Present   Feeling of Stress : Not at all  Social Connections: Unknown   Frequency of Communication with Friends and Family: More than three times a week   Frequency of Social Gatherings with Friends and Family: More than three times a week   Attends Religious Services: Patient refused   Marine scientist or Organizations: Patient refused   Attends Music therapist: Patient refused   Marital Status: Married  Human resources officer Violence: Not on file     Physical Exam: Ht 5\' 5"  (1.651 m)   Wt 156 lb 3.2 oz (70.9 kg)   BMI 25.99 kg/m  Constitutional: generally well-appearing Psychiatric: alert and oriented x3 Abdomen: soft, nontender, nondistended, no obvious ascites, no peritoneal signs, normal bowel sounds No peripheral edema noted in lower extremities  Assessment and plan: 70 y.o.  male with chronic mild constipation, GERD without alarm symptoms  First I recommended that he resume his fiber supplements Citrucel 1 tablespoon every day and a large glass of water and that he also mix in 1 dose of MiraLAX into that same glass of water every morning.  He should call to report on his response to this bowel regimen in 6 to 7 weeks.  Second he has once or twice weekly intermittent mild pyrosis without alarm symptoms.  This occurs when he is working in the yard or bending over otherwise.  I recommended that he start a trial  of OTC Pepcid/famotidine 20 mg pills 1 pill as needed.  I would like to hear from him also in 6 to 7 weeks to know if this is helping his mild intermittent GERD symptoms.  Please see the "Patient Instructions" section for addition details about the plan.  Owens Loffler, MD [facility] 12/31/2020, 8:40 AM   Total time on date of encounter was 30 minutes (this included time spent preparing to see the patient reviewing records; obtaining and/or reviewing separately obtained history; performing a medically appropriate exam and/or evaluation; counseling and educating the patient and family if present; ordering medications, tests or procedures if applicable; and documenting clinical information in the health record).

## 2020-12-31 NOTE — Patient Instructions (Addendum)
If you are age 70 or older, your body mass index should be between 23-30. Your Body mass index is 25.99 kg/m. If this is out of the aforementioned range listed, please consider follow up with your Primary Care Provider. __________________________________________________________  The [facility] providers would like to encourage you to use [facility] to communicate with providers for non-urgent requests or questions.  Due to long hold times on the telephone, sending your provider a message by [facility] may be a faster and more efficient way to get a response.  Please allow 48 business hours for a response.  Please remember that this is for non-urgent requests.  __________________________________________________________  Please start taking citrucel (orange flavored) powder fiber supplement.  This may cause some bloating at first but that usually goes away. Begin with a small spoonful and work your way up to a large, heaping spoonful daily over a week.  Please start taking Miralax powder (one capful with water) everyday at the sametime you take citrucel.  Please purchase the following medications over the counter and take as directed:  START: famotidine 20mg  take one tablet daily as needed.  Please call our office in 6 to 7 weeks to report on how you are feeling.  You can also send a MyChart message to [place], Therapist, sports.  Thank you for entrusting me with your care and choosing [facility].  Dr Ardis Hughs

## 2021-01-06 ENCOUNTER — Ambulatory Visit (INDEPENDENT_AMBULATORY_CARE_PROVIDER_SITE_OTHER): Payer: HMO | Admitting: Cardiology

## 2021-01-06 ENCOUNTER — Other Ambulatory Visit: Payer: Self-pay

## 2021-01-06 ENCOUNTER — Encounter: Payer: Self-pay | Admitting: Cardiology

## 2021-01-06 VITALS — BP 130/80 | HR 62 | Ht 65.0 in | Wt 154.0 lb

## 2021-01-06 DIAGNOSIS — Z01818 Encounter for other preprocedural examination: Secondary | ICD-10-CM | POA: Diagnosis not present

## 2021-01-06 DIAGNOSIS — I10 Essential (primary) hypertension: Secondary | ICD-10-CM | POA: Diagnosis not present

## 2021-01-06 DIAGNOSIS — Z01812 Encounter for preprocedural laboratory examination: Secondary | ICD-10-CM | POA: Diagnosis not present

## 2021-01-06 DIAGNOSIS — I25119 Atherosclerotic heart disease of native coronary artery with unspecified angina pectoris: Secondary | ICD-10-CM | POA: Diagnosis not present

## 2021-01-06 DIAGNOSIS — E785 Hyperlipidemia, unspecified: Secondary | ICD-10-CM | POA: Diagnosis not present

## 2021-01-06 DIAGNOSIS — R931 Abnormal findings on diagnostic imaging of heart and coronary circulation: Secondary | ICD-10-CM | POA: Diagnosis not present

## 2021-01-06 LAB — BASIC METABOLIC PANEL
BUN/Creatinine Ratio: 22 (ref 10–24)
BUN: 21 mg/dL (ref 8–27)
CO2: 28 mmol/L (ref 20–29)
Calcium: 10.4 mg/dL — ABNORMAL HIGH (ref 8.6–10.2)
Chloride: 99 mmol/L (ref 96–106)
Creatinine, Ser: 0.94 mg/dL (ref 0.76–1.27)
Glucose: 179 mg/dL — ABNORMAL HIGH (ref 65–99)
Potassium: 3.9 mmol/L (ref 3.5–5.2)
Sodium: 136 mmol/L (ref 134–144)
eGFR: 87 mL/min/{1.73_m2} (ref >59)

## 2021-01-06 LAB — CBC
Hematocrit: 41.2 % (ref 37.5–51.0)
Hemoglobin: 13.9 g/dL (ref 13.0–17.7)
MCH: 30.7 pg (ref 26.6–33.0)
MCHC: 33.7 g/dL (ref 31.5–35.7)
MCV: 91 fL (ref 79–97)
Platelets: 235 10*3/uL (ref 150–450)
RBC: 4.53 x10E6/uL (ref 4.14–5.80)
RDW: 13.7 % (ref 11.6–15.4)
WBC: 6.2 10*3/uL (ref 3.4–10.8)

## 2021-01-06 NOTE — Progress Notes (Signed)
Cardiology Office Note:    Date:  01/07/2021   ID:  Ryan Ferguson, DOB 12-07-1950, MRN [id number]  PCP:  Cassandria Anger, MD   [facility] Providers Cardiologist:  Candee Furbish, MD     Referring MD: Cassandria Anger, MD    History of Present Illness:    Ryan Ferguson is a 70 y.o. male here to discuss abnormal cardiac CT with highly elevated calcium score, abnormal lumen.  Has type 2 diabetes, chronic kidney disease hypertension.  50-pack-year smoking history. - He did up seeing my partner Dr. Curt Bears when his chest was affecting him, centralized chest discomfort when walking his dog.  This happened 4 separate times.  After he stopped it went away.  Had some nausea.  Past Medical History:  Diagnosis Date   Chronic kidney disease    Colon polyps    Diabetes mellitus without complication ([place])    does not check cbg daily   Hypertension    Hyperthyroidism    Kidney stones     Past Surgical History:  Procedure Laterality Date   APPENDECTOMY     age 71   CYSTOSCOPY     LITHOTRIPSY     4-5 times    Current Medications: Current Meds  Medication Sig   amLODipine (NORVASC) 2.5 MG tablet Take 1 tablet (2.5 mg total) by mouth daily. (Patient taking differently: Take 2.5 mg by mouth in the morning.)   aspirin EC 81 MG tablet Take 1 tablet (81 mg total) by mouth daily. (Patient taking differently: Take 81 mg by mouth every evening.)   cholecalciferol (VITAMIN D) 1000 UNITS tablet Take 1,000 Units by mouth daily.   EUTHYROX 112 MCG tablet TAKE 1 TABLET BY MOUTH IN THE MORNING (Patient taking differently: Take 112 mcg by mouth daily before breakfast.)   metFORMIN (GLUCOPHAGE) 1000 MG tablet Take 1 tablet by mouth twice daily (Patient taking differently: Take 1,000 mg by mouth in the morning and at bedtime.)   nitroGLYCERIN (NITROSTAT) 0.4 MG SL tablet Place 1 tablet (0.4 mg total) under the tongue every 5 (five) minutes as needed for chest pain. (Patient taking  differently: Place 0.4 mg under the tongue every 5 (five) minutes x 3 doses as needed for chest pain.)   olmesartan-hydrochlorothiazide (BENICAR HCT) 40-12.5 MG tablet Take 1 tablet by mouth daily. (Patient taking differently: Take 1 tablet by mouth every evening.)   ONE TOUCH ULTRA TEST test strip USE TEST STRIPS TO TEST BLOOD SUGAR ONCE DAILY   pravastatin (PRAVACHOL) 20 MG tablet Take 1 tablet (20 mg total) by mouth daily. (Patient taking differently: Take 20 mg by mouth every evening.)   repaglinide (PRANDIN) 0.5 MG tablet Take 1 tablet (0.5 mg total) by mouth 3 (three) times daily before meals.     Allergies:   Hydrochlorothiazide, Lipitor [atorvastatin], Lisinopril, and Pravachol [pravastatin]   Social History   Socioeconomic History   Marital status: Married    Spouse name: Not on file   Number of children: 3   Years of education: Not on file   Highest education level: Not on file  Occupational History   Not on file  Tobacco Use   Smoking status: Former    Packs/day: 1.00    Types: Cigarettes    Quit date: 10/25/2010    Years since quitting: 10.2   Smokeless tobacco: Never  Vaping Use   Vaping Use: Never used  Substance and Sexual Activity   Alcohol use: Yes    Comment: occasional  Drug use: No   Sexual activity: Not on file  Other Topics Concern   Not on file  Social History Narrative   Not on file   Social Determinants of Health   Financial Resource Strain: Low Risk    Difficulty of Paying Living Expenses: Not hard at all  Food Insecurity: Not on file  Transportation Needs: No Transportation Needs   Lack of Transportation (Medical): No   Lack of Transportation (Non-Medical): No  Physical Activity: Inactive   Days of Exercise per Week: 0 days   Minutes of Exercise per Session: 0 min  Stress: No Stress Concern Present   Feeling of Stress : Not at all  Social Connections: Unknown   Frequency of Communication with Friends and Family: More than three times a  week   Frequency of Social Gatherings with Friends and Family: More than three times a week   Attends Religious Services: Patient refused   Marine scientist or Organizations: Patient refused   Attends Music therapist: Patient refused   Marital Status: Married     Family History: The patient's family history includes Diabetes in his maternal grandmother; Heart disease in his maternal grandfather, mother, and paternal grandmother; High Cholesterol in his father; Hypertension in his father; Kidney disease in his father; Lung cancer in his maternal grandfather; Lupus in his sister; Pancreatic cancer in his father; Thyroid disease in his mother; Varicose Veins in his father. There is no history of Colon cancer, Esophageal cancer, Stomach cancer, or Liver disease.  ROS:   Please see the history of present illness.    No fevers chills nausea vomiting syncope bleeding all other systems reviewed and are negative.  EKGs/Labs/Other Studies Reviewed:    The following studies were reviewed today:  12/26/20 Cardiac CT:  Coronary calcium score: The patient's coronary artery calcium score is 1492, which places the patient in the 90th percentile.   Coronary arteries: Normal coronary origins.  Right dominance.   Right Coronary Artery: Dominant, moderate-sized vessel with large acute marginal branch. Gives off R-PDA and R-PLB branches. No disease.   Left Main Coronary Artery: Calcified at the ostium with minimal 1-24% mixed stenosis (CADRADS1). Gives of the LAD and LCx arteries.   Left Anterior Descending Coronary Artery: Large anterior vessel, heavily calcified proximally with moderate to possibly severe stenosis (CADRADS3). 2 large proximal diagonal branches, each with at least moderate proximal mixed stenosis (CADRADS3).   Left Circumflex Artery: Large AV groove vessel with minimal to mild mixed 24-49% stenoses of the proximal and mid-vessel - there is likely mild mixed  24-49% distal stenosis (CADARADS2).   Aorta: Normal size, 34 mm at the mid ascending aorta (level of the PA bifurcation) measured double oblique. Aortic atherosclerosis. No dissection.   Aortic Valve: Trileaflet. Scattered leaflet and aortic root calcifications.   Other findings:   Normal pulmonary vein drainage into the left atrium.   Normal left atrial appendage without a thrombus.   Normal size of the pulmonary artery.   IMPRESSION: 1. Moderate to possibly severe mixed coronary disease, primarily of the proximal LAD/Diagonals, CADRADS = 3. CT FFR will be performed and reported separately.   2. Coronary calcium score of 1492. This was 90th percentile for age and sex matched control.   3. Normal coronary origin with right dominance.   4. Aortic atherosclerosis with aortic root and leaflet calcifications.      1. Left Main:  No significant stenosis. FFR = 0.99   2. LAD: No significant stenosis.  Proximal FFR = 0.99, Mid FFR = 0.84, Distal FFR = 0.77 (tapering) 3. D1: Significant stenosis.  Proximal FFR = 0.99, Distal FFR = 0.55 4. D2: No significant stenosis. Proximal FFR = 0.93, Distal FFR = 0.87 5. LCX: No significant stenosis. Proximal FFR = , Distal FFR = 6. RCA: No significant stenosis. Proximal FFR = 1.00, Mid FFR = 0.95, Distal FFR = 0.93   IMPRESSION: 1. CT FFR demonstrates significant discrete stenosis of the mid D1 branch which is flow limiting. There is tapering flow limitation at the distal LAD, probably not suggestive of a discrete stenosis.  EKG:  EKG is  ordered today.  The ekg ordered today demonstrates sinus rhythm 62 with nonspecific T wave flattening.  Recent Labs: 12/03/2020: ALT 17; TSH 0.55 01/06/2021: BUN 21; Creatinine, Ser 0.94; Hemoglobin 13.9; Platelets 235; Potassium 3.9; Sodium 136  Recent Lipid Panel    Component Value Date/Time   CHOL 219 (H) 12/03/2020 0856   TRIG 175.0 (H) 12/03/2020 0856   HDL 47.50 12/03/2020 0856   CHOLHDL 5  12/03/2020 0856   VLDL 35.0 12/03/2020 0856   LDLCALC 137 (H) 12/03/2020 0856     Risk Assessment/Calculations:          Physical Exam:    VS:  BP 130/80 (BP Location: Left Arm, Patient Position: Sitting, Cuff Size: Normal)   Pulse 62   Ht 5\' 5"  (1.651 m)   Wt 154 lb (69.9 kg)   SpO2 95%   BMI 25.63 kg/m     Wt Readings from Last 3 Encounters:  01/06/21 154 lb (69.9 kg)  12/31/20 156 lb 3.2 oz (70.9 kg)  12/12/20 156 lb (70.8 kg)     GEN:  Well nourished, well developed in no acute distress HEENT: Normal NECK: No JVD; No carotid bruits LYMPHATICS: No lymphadenopathy CARDIAC: RRR, no murmurs, rubs, gallops RESPIRATORY:  Clear to auscultation without rales, wheezing or rhonchi  ABDOMEN: Soft, non-tender, non-distended MUSCULOSKELETAL:  No edema; No deformity  SKIN: Warm and dry NEUROLOGIC:  Alert and oriented x 3 PSYCHIATRIC:  Normal affect   ASSESSMENT:    1. Coronary artery disease involving native coronary artery of native heart with angina pectoris ([place])   2. Abnormal findings on diagnostic imaging of heart and coronary circulation   3. Primary hypertension   4. Hyperlipidemia, unspecified hyperlipidemia type   5. Pre-procedure lab exam    PLAN:    In order of problems listed above:  Coronary artery disease/abnormal cardiac CT/abnormal FFR - We will proceed with cardiac catheterization.  Risks and benefits have been explained including stroke heart attack death renal impairment bleeding.  He is willing to proceed.  He and his wife are present for discussion.  Excellent radial artery.  Multiple questions answered.  He does not have any strong family history.  He is a former smoker, quit 10 years ago.  Hyperlipidemia, hypertension.  Eventually transition him to high intensity statin Crestor 20 mg.  Has exertional anginal symptoms.  41 minutes spent with review of data, personally reviewed CT images and displayed to patient and wife, review of cardiac  catheterization in depth.  Shared Decision Making/Informed Consent The risks [stroke (1 in 1000), death (1 in 1000), kidney failure [usually temporary] (1 in 500), bleeding (1 in 200), allergic reaction [possibly serious] (1 in 200)], benefits (diagnostic support and management of coronary artery disease) and alternatives of a cardiac catheterization were discussed in detail with Mr. Nishi and he is willing to proceed.  Medication Adjustments/Labs and Tests Ordered: Current medicines are reviewed at length with the patient today.  Concerns regarding medicines are outlined above.  Orders Placed This Encounter  Procedures   CBC   Basic metabolic panel   EKG 09-UEAV    No orders of the defined types were placed in this encounter.   Patient Instructions  Medication Instructions:  The current medical regimen is effective;  continue present plan and medications.  *If you need a refill on your cardiac medications before your next appointment, please call your pharmacy*   Lab Work: Please have blood work today (CBC, BMP)  If you have labs (blood work) drawn today and your tests are completely normal, you will receive your results only by: MyChart Message (if you have [place]) OR A paper copy in the mail If you have any lab test that is abnormal or we need to change your treatment, we will call you to review the results.   Testing/Procedures:   [place] OFFICE [place], [place] [address] Dept: [phone] Loc: [place]  01/06/2021  You are scheduled for a Cardiac Catheterization on Friday, July 22 with Dr. Shelva Majestic.  1. Please arrive at the [facility] (Main Entrance A) at [facility]: [place], [place] [address] at 8:30 AM (two hours before your procedure to ensure your preparation). Free valet parking service is available.    Special note: Every effort is made to have your procedure done on time. Please understand that emergencies sometimes delay scheduled procedures.  2. Diet: Do not eat or drink anything after midnight prior to your procedure except sips of water to take medications.  3. Labs: Your labs will be performed at the hospital after you arrive for your procedure.  4. Medication instructions in preparation for your procedure:  Stop taking, Glucophage (Metformin) Thursday, July 21,  On the morning of your procedure, take your Aspirin and any morning medicines NOT listed above.  You may use sips of water.  5. Plan for one night stay--bring personal belongings. 6. Bring a current list of your medications and current insurance cards. 7. You MUST have a responsible person to drive you home. 8. Someone MUST be with you the first 24 hours after you arrive home or your discharge will be delayed. 9. Please wear clothes that are easy to get on and off and wear slip-on shoes.  Thank you for allowing [place] to care for you!   -- [facility]  Follow-Up: At [facility], you and your health needs are our priority.  As part of our continuing mission to provide you with exceptional heart care, we have created designated Provider Care Teams.  These Care Teams include your primary Cardiologist (physician) and Advanced Practice Providers (APPs -  Physician Assistants and Nurse Practitioners) who all work together to provide you with the care you need, when you need it.  We recommend signing up for the patient portal called "MyChart".  Sign up information is provided on this After Visit Summary.  MyChart is used to connect with patients for Virtual Visits (Telemedicine).  Patients are able to view lab/test results, encounter notes, upcoming appointments, etc.  Non-urgent messages can be sent to your provider as well.   To learn more about what you can do with MyChart, go to  [email or web address].    Your next appointment:   As scheduled  Thank you for choosing [facility]!!     Signed, Candee Furbish, MD  01/07/2021 4:20 PM    [facility]

## 2021-01-06 NOTE — H&P (View-Only) (Signed)
Cardiology Office Note:    Date:  01/07/2021   ID:  Ryan Ferguson, DOB 03-01-1951, MRN [id number]  PCP:  Cassandria Anger, MD   [facility] Providers Cardiologist:  Candee Furbish, MD     Referring MD: Cassandria Anger, MD    History of Present Illness:    Ryan Ferguson is a 70 y.o. male here to discuss abnormal cardiac CT with highly elevated calcium score, abnormal lumen.  Has type 2 diabetes, chronic kidney disease hypertension.  50-pack-year smoking history. - He did up seeing my partner Dr. Curt Bears when his chest was affecting him, centralized chest discomfort when walking his dog.  This happened 4 separate times.  After he stopped it went away.  Had some nausea.  Past Medical History:  Diagnosis Date   Chronic kidney disease    Colon polyps    Diabetes mellitus without complication ([place])    does not check cbg daily   Hypertension    Hyperthyroidism    Kidney stones     Past Surgical History:  Procedure Laterality Date   APPENDECTOMY     age 18   CYSTOSCOPY     LITHOTRIPSY     4-5 times    Current Medications: Current Meds  Medication Sig   amLODipine (NORVASC) 2.5 MG tablet Take 1 tablet (2.5 mg total) by mouth daily. (Patient taking differently: Take 2.5 mg by mouth in the morning.)   aspirin EC 81 MG tablet Take 1 tablet (81 mg total) by mouth daily. (Patient taking differently: Take 81 mg by mouth every evening.)   cholecalciferol (VITAMIN D) 1000 UNITS tablet Take 1,000 Units by mouth daily.   EUTHYROX 112 MCG tablet TAKE 1 TABLET BY MOUTH IN THE MORNING (Patient taking differently: Take 112 mcg by mouth daily before breakfast.)   metFORMIN (GLUCOPHAGE) 1000 MG tablet Take 1 tablet by mouth twice daily (Patient taking differently: Take 1,000 mg by mouth in the morning and at bedtime.)   nitroGLYCERIN (NITROSTAT) 0.4 MG SL tablet Place 1 tablet (0.4 mg total) under the tongue every 5 (five) minutes as needed for chest pain. (Patient taking  differently: Place 0.4 mg under the tongue every 5 (five) minutes x 3 doses as needed for chest pain.)   olmesartan-hydrochlorothiazide (BENICAR HCT) 40-12.5 MG tablet Take 1 tablet by mouth daily. (Patient taking differently: Take 1 tablet by mouth every evening.)   ONE TOUCH ULTRA TEST test strip USE TEST STRIPS TO TEST BLOOD SUGAR ONCE DAILY   pravastatin (PRAVACHOL) 20 MG tablet Take 1 tablet (20 mg total) by mouth daily. (Patient taking differently: Take 20 mg by mouth every evening.)   repaglinide (PRANDIN) 0.5 MG tablet Take 1 tablet (0.5 mg total) by mouth 3 (three) times daily before meals.     Allergies:   Hydrochlorothiazide, Lipitor [atorvastatin], Lisinopril, and Pravachol [pravastatin]   Social History   Socioeconomic History   Marital status: Married    Spouse name: Not on file   Number of children: 3   Years of education: Not on file   Highest education level: Not on file  Occupational History   Not on file  Tobacco Use   Smoking status: Former    Packs/day: 1.00    Types: Cigarettes    Quit date: 10/25/2010    Years since quitting: 10.2   Smokeless tobacco: Never  Vaping Use   Vaping Use: Never used  Substance and Sexual Activity   Alcohol use: Yes    Comment: occasional  Drug use: No   Sexual activity: Not on file  Other Topics Concern   Not on file  Social History Narrative   Not on file   Social Determinants of Health   Financial Resource Strain: Low Risk    Difficulty of Paying Living Expenses: Not hard at all  Food Insecurity: Not on file  Transportation Needs: No Transportation Needs   Lack of Transportation (Medical): No   Lack of Transportation (Non-Medical): No  Physical Activity: Inactive   Days of Exercise per Week: 0 days   Minutes of Exercise per Session: 0 min  Stress: No Stress Concern Present   Feeling of Stress : Not at all  Social Connections: Unknown   Frequency of Communication with Friends and Family: More than three times a  week   Frequency of Social Gatherings with Friends and Family: More than three times a week   Attends Religious Services: Patient refused   Marine scientist or Organizations: Patient refused   Attends Music therapist: Patient refused   Marital Status: Married     Family History: The patient's family history includes Diabetes in his maternal grandmother; Heart disease in his maternal grandfather, mother, and paternal grandmother; High Cholesterol in his father; Hypertension in his father; Kidney disease in his father; Lung cancer in his maternal grandfather; Lupus in his sister; Pancreatic cancer in his father; Thyroid disease in his mother; Varicose Veins in his father. There is no history of Colon cancer, Esophageal cancer, Stomach cancer, or Liver disease.  ROS:   Please see the history of present illness.    No fevers chills nausea vomiting syncope bleeding all other systems reviewed and are negative.  EKGs/Labs/Other Studies Reviewed:    The following studies were reviewed today:  12/26/20 Cardiac CT:  Coronary calcium score: The patient's coronary artery calcium score is 1492, which places the patient in the 90th percentile.   Coronary arteries: Normal coronary origins.  Right dominance.   Right Coronary Artery: Dominant, moderate-sized vessel with large acute marginal branch. Gives off R-PDA and R-PLB branches. No disease.   Left Main Coronary Artery: Calcified at the ostium with minimal 1-24% mixed stenosis (CADRADS1). Gives of the LAD and LCx arteries.   Left Anterior Descending Coronary Artery: Large anterior vessel, heavily calcified proximally with moderate to possibly severe stenosis (CADRADS3). 2 large proximal diagonal branches, each with at least moderate proximal mixed stenosis (CADRADS3).   Left Circumflex Artery: Large AV groove vessel with minimal to mild mixed 24-49% stenoses of the proximal and mid-vessel - there is likely mild mixed  24-49% distal stenosis (CADARADS2).   Aorta: Normal size, 34 mm at the mid ascending aorta (level of the PA bifurcation) measured double oblique. Aortic atherosclerosis. No dissection.   Aortic Valve: Trileaflet. Scattered leaflet and aortic root calcifications.   Other findings:   Normal pulmonary vein drainage into the left atrium.   Normal left atrial appendage without a thrombus.   Normal size of the pulmonary artery.   IMPRESSION: 1. Moderate to possibly severe mixed coronary disease, primarily of the proximal LAD/Diagonals, CADRADS = 3. CT FFR will be performed and reported separately.   2. Coronary calcium score of 1492. This was 90th percentile for age and sex matched control.   3. Normal coronary origin with right dominance.   4. Aortic atherosclerosis with aortic root and leaflet calcifications.      1. Left Main:  No significant stenosis. FFR = 0.99   2. LAD: No significant stenosis.  Proximal FFR = 0.99, Mid FFR = 0.84, Distal FFR = 0.77 (tapering) 3. D1: Significant stenosis.  Proximal FFR = 0.99, Distal FFR = 0.55 4. D2: No significant stenosis. Proximal FFR = 0.93, Distal FFR = 0.87 5. LCX: No significant stenosis. Proximal FFR = , Distal FFR = 6. RCA: No significant stenosis. Proximal FFR = 1.00, Mid FFR = 0.95, Distal FFR = 0.93   IMPRESSION: 1. CT FFR demonstrates significant discrete stenosis of the mid D1 branch which is flow limiting. There is tapering flow limitation at the distal LAD, probably not suggestive of a discrete stenosis.  EKG:  EKG is  ordered today.  The ekg ordered today demonstrates sinus rhythm 62 with nonspecific T wave flattening.  Recent Labs: 12/03/2020: ALT 17; TSH 0.55 01/06/2021: BUN 21; Creatinine, Ser 0.94; Hemoglobin 13.9; Platelets 235; Potassium 3.9; Sodium 136  Recent Lipid Panel    Component Value Date/Time   CHOL 219 (H) 12/03/2020 0856   TRIG 175.0 (H) 12/03/2020 0856   HDL 47.50 12/03/2020 0856   CHOLHDL 5  12/03/2020 0856   VLDL 35.0 12/03/2020 0856   LDLCALC 137 (H) 12/03/2020 0856     Risk Assessment/Calculations:          Physical Exam:    VS:  BP 130/80 (BP Location: Left Arm, Patient Position: Sitting, Cuff Size: Normal)   Pulse 62   Ht 5\' 5"  (1.651 m)   Wt 154 lb (69.9 kg)   SpO2 95%   BMI 25.63 kg/m     Wt Readings from Last 3 Encounters:  01/06/21 154 lb (69.9 kg)  12/31/20 156 lb 3.2 oz (70.9 kg)  12/12/20 156 lb (70.8 kg)     GEN:  Well nourished, well developed in no acute distress HEENT: Normal NECK: No JVD; No carotid bruits LYMPHATICS: No lymphadenopathy CARDIAC: RRR, no murmurs, rubs, gallops RESPIRATORY:  Clear to auscultation without rales, wheezing or rhonchi  ABDOMEN: Soft, non-tender, non-distended MUSCULOSKELETAL:  No edema; No deformity  SKIN: Warm and dry NEUROLOGIC:  Alert and oriented x 3 PSYCHIATRIC:  Normal affect   ASSESSMENT:    1. Coronary artery disease involving native coronary artery of native heart with angina pectoris ([place])   2. Abnormal findings on diagnostic imaging of heart and coronary circulation   3. Primary hypertension   4. Hyperlipidemia, unspecified hyperlipidemia type   5. Pre-procedure lab exam    PLAN:    In order of problems listed above:  Coronary artery disease/abnormal cardiac CT/abnormal FFR - We will proceed with cardiac catheterization.  Risks and benefits have been explained including stroke heart attack death renal impairment bleeding.  He is willing to proceed.  He and his wife are present for discussion.  Excellent radial artery.  Multiple questions answered.  He does not have any strong family history.  He is a former smoker, quit 10 years ago.  Hyperlipidemia, hypertension.  Eventually transition him to high intensity statin Crestor 20 mg.  Has exertional anginal symptoms.  41 minutes spent with review of data, personally reviewed CT images and displayed to patient and wife, review of cardiac  catheterization in depth.  Shared Decision Making/Informed Consent The risks [stroke (1 in 1000), death (1 in 1000), kidney failure [usually temporary] (1 in 500), bleeding (1 in 200), allergic reaction [possibly serious] (1 in 200)], benefits (diagnostic support and management of coronary artery disease) and alternatives of a cardiac catheterization were discussed in detail with Mr. Flippen and he is willing to proceed.  Medication Adjustments/Labs and Tests Ordered: Current medicines are reviewed at length with the patient today.  Concerns regarding medicines are outlined above.  Orders Placed This Encounter  Procedures   CBC   Basic metabolic panel   EKG 56-DJSH    No orders of the defined types were placed in this encounter.   Patient Instructions  Medication Instructions:  The current medical regimen is effective;  continue present plan and medications.  *If you need a refill on your cardiac medications before your next appointment, please call your pharmacy*   Lab Work: Please have blood work today (CBC, BMP)  If you have labs (blood work) drawn today and your tests are completely normal, you will receive your results only by: MyChart Message (if you have [place]) OR A paper copy in the mail If you have any lab test that is abnormal or we need to change your treatment, we will call you to review the results.   Testing/Procedures:   [place] OFFICE [place], [place] [address] Dept: [phone] Loc: [place]  01/06/2021  You are scheduled for a Cardiac Catheterization on Friday, July 22 with Dr. Shelva Majestic.  1. Please arrive at the [facility] (Main Entrance A) at [facility]: [place], [place] [address] at 8:30 AM (two hours before your procedure to ensure your preparation). Free valet parking service is available.    Special note: Every effort is made to have your procedure done on time. Please understand that emergencies sometimes delay scheduled procedures.  2. Diet: Do not eat or drink anything after midnight prior to your procedure except sips of water to take medications.  3. Labs: Your labs will be performed at the hospital after you arrive for your procedure.  4. Medication instructions in preparation for your procedure:  Stop taking, Glucophage (Metformin) Thursday, July 21,  On the morning of your procedure, take your Aspirin and any morning medicines NOT listed above.  You may use sips of water.  5. Plan for one night stay--bring personal belongings. 6. Bring a current list of your medications and current insurance cards. 7. You MUST have a responsible person to drive you home. 8. Someone MUST be with you the first 24 hours after you arrive home or your discharge will be delayed. 9. Please wear clothes that are easy to get on and off and wear slip-on shoes.  Thank you for allowing [place] to care for you!   -- [place] [facility]  Follow-Up: At [facility], you and your health needs are our priority.  As part of our continuing mission to provide you with exceptional heart care, we have created designated Provider Care Teams.  These Care Teams include your primary Cardiologist (physician) and Advanced Practice Providers (APPs -  Physician Assistants and Nurse Practitioners) who all work together to provide you with the care you need, when you need it.  We recommend signing up for the patient portal called "MyChart".  Sign up information is provided on this After Visit Summary.  MyChart is used to connect with patients for Virtual Visits (Telemedicine).  Patients are able to view lab/test results, encounter notes, upcoming appointments, etc.  Non-urgent messages can be sent to your provider as well.   To learn more about what you can do with MyChart, go to  [email or web address].    Your next appointment:   As scheduled  Thank you for choosing [facility]!!     Signed, Candee Furbish, MD  01/07/2021 4:20 PM    [facility]

## 2021-01-06 NOTE — Patient Instructions (Addendum)
Medication Instructions:  The current medical regimen is effective;  continue present plan and medications.  *If you need a refill on your cardiac medications before your next appointment, please call your pharmacy*   Lab Work: Please have blood work today (CBC, BMP)  If you have labs (blood work) drawn today and your tests are completely normal, you will receive your results only by: MyChart Message (if you have [place]) OR A paper copy in the mail If you have any lab test that is abnormal or we need to change your treatment, we will call you to review the results.   Testing/Procedures:   [place] OFFICE [place], [place] [address] Dept: [phone] Loc: [place]  01/06/2021  You are scheduled for a Cardiac Catheterization on Friday, July 22 with Dr. Shelva Majestic.  1. Please arrive at the [facility] (Main Entrance A) at [facility]: [place], [place] [address] at 8:30 AM (two hours before your procedure to ensure your preparation). Free valet parking service is available.   Special note: Every effort is made to have your procedure done on time. Please understand that emergencies sometimes delay scheduled procedures.  2. Diet: Do not eat or drink anything after midnight prior to your procedure except sips of water to take medications.  3. Labs: Your labs will be performed at the hospital after you arrive for your procedure.  4. Medication instructions in preparation for your procedure:  Stop taking, Glucophage (Metformin) Thursday, July 21,  On the morning of your procedure, take your Aspirin and any morning medicines NOT listed above.  You may use sips of water.  5. Plan for one night stay--bring personal belongings. 6. Bring a current list of your medications and current insurance cards. 7. You MUST have a responsible person to  drive you home. 8. Someone MUST be with you the first 24 hours after you arrive home or your discharge will be delayed. 9. Please wear clothes that are easy to get on and off and wear slip-on shoes.  Thank you for allowing [place] to care for you!   -- [place] [facility]  Follow-Up: At [facility], you and your health needs are our priority.  As part of our continuing mission to provide you with exceptional heart care, we have created designated Provider Care Teams.  These Care Teams include your primary Cardiologist (physician) and Advanced Practice Providers (APPs -  Physician Assistants and Nurse Practitioners) who all work together to provide you with the care you need, when you need it.  We recommend signing up for the patient portal called "MyChart".  Sign up information is provided on this After Visit Summary.  MyChart is used to connect with patients for Virtual Visits (Telemedicine).  Patients are able to view lab/test results, encounter notes, upcoming appointments, etc.  Non-urgent messages can be sent to your provider as well.   To learn more about what you can do with MyChart, go to [email or web address].    Your next appointment:   As scheduled  Thank you for choosing [facility]!!

## 2021-01-08 ENCOUNTER — Telehealth: Payer: Self-pay | Admitting: *Deleted

## 2021-01-08 NOTE — Telephone Encounter (Signed)
Pt contacted pre-catheterization scheduled at [facility] for: January 09, 2021 10:30 AM Verified arrival time and place: [facility]) at: 8:30 AM   No solid food after midnight prior to cath, clear liquids until 5 AM day of procedure.  Hold: Metformin-day of procedure and 48 hours post procedure Prandin-AM of procedure Olmesartan-HCT-pt reports he takes in the evening.  Except hold medications AM meds can be  taken pre-cath with sips of water including: aspirin 81 mg   Confirmed patient has responsible adult to drive home post procedure and be with patient first 24 hours after arriving home: yes  You are allowed ONE visitor in the waiting room during the time you are at the hospital for your procedure. Both you and your visitor must wear a mask once you enter the hospital.   Patient reports does not currently have any symptoms concerning for COVID-19 and no household members with COVID-19 like illness.   Reviewed procedure/mask/visitor instructions with patient.

## 2021-01-09 ENCOUNTER — Encounter: Payer: Self-pay | Admitting: *Deleted

## 2021-01-09 ENCOUNTER — Ambulatory Visit (HOSPITAL_COMMUNITY)
Admission: RE | Admit: 2021-01-09 | Discharge: 2021-01-09 | Disposition: A | Discharge status: Home / Self Care | Payer: HMO | Attending: Cardiovascular Disease | Admitting: Cardiovascular Disease

## 2021-01-09 ENCOUNTER — Other Ambulatory Visit: Payer: Self-pay

## 2021-01-09 ENCOUNTER — Other Ambulatory Visit (HOSPITAL_COMMUNITY): Payer: Self-pay

## 2021-01-09 ENCOUNTER — Encounter (HOSPITAL_COMMUNITY)
Admission: RE | Disposition: A | Discharge status: Home / Self Care | Payer: Self-pay | Source: Home / Self Care | Attending: Cardiovascular Disease

## 2021-01-09 DIAGNOSIS — I129 Hypertensive chronic kidney disease with stage 1 through stage 4 chronic kidney disease, or unspecified chronic kidney disease: Secondary | ICD-10-CM | POA: Insufficient documentation

## 2021-01-09 DIAGNOSIS — E1122 Type 2 diabetes mellitus with diabetic chronic kidney disease: Secondary | ICD-10-CM | POA: Diagnosis not present

## 2021-01-09 DIAGNOSIS — Z955 Presence of coronary angioplasty implant and graft: Secondary | ICD-10-CM | POA: Insufficient documentation

## 2021-01-09 DIAGNOSIS — R931 Abnormal findings on diagnostic imaging of heart and coronary circulation: Secondary | ICD-10-CM | POA: Diagnosis not present

## 2021-01-09 DIAGNOSIS — N189 Chronic kidney disease, unspecified: Secondary | ICD-10-CM | POA: Insufficient documentation

## 2021-01-09 DIAGNOSIS — E785 Hyperlipidemia, unspecified: Secondary | ICD-10-CM

## 2021-01-09 DIAGNOSIS — I2510 Atherosclerotic heart disease of native coronary artery without angina pectoris: Secondary | ICD-10-CM

## 2021-01-09 DIAGNOSIS — I25119 Atherosclerotic heart disease of native coronary artery with unspecified angina pectoris: Secondary | ICD-10-CM | POA: Diagnosis not present

## 2021-01-09 DIAGNOSIS — Z7982 Long term (current) use of aspirin: Secondary | ICD-10-CM | POA: Diagnosis not present

## 2021-01-09 DIAGNOSIS — Z87891 Personal history of nicotine dependence: Secondary | ICD-10-CM | POA: Diagnosis not present

## 2021-01-09 DIAGNOSIS — Z006 Encounter for examination for normal comparison and control in clinical research program: Secondary | ICD-10-CM

## 2021-01-09 DIAGNOSIS — Z888 Allergy status to other drugs, medicaments and biological substances status: Secondary | ICD-10-CM | POA: Diagnosis not present

## 2021-01-09 DIAGNOSIS — Z7984 Long term (current) use of oral hypoglycemic drugs: Secondary | ICD-10-CM | POA: Diagnosis not present

## 2021-01-09 DIAGNOSIS — I10 Essential (primary) hypertension: Secondary | ICD-10-CM | POA: Diagnosis present

## 2021-01-09 DIAGNOSIS — Z79899 Other long term (current) drug therapy: Secondary | ICD-10-CM | POA: Diagnosis not present

## 2021-01-09 HISTORY — PX: LEFT HEART CATH AND CORONARY ANGIOGRAPHY: CATH118249

## 2021-01-09 HISTORY — PX: CORONARY STENT INTERVENTION: CATH118234

## 2021-01-09 LAB — POCT ACTIVATED CLOTTING TIME
Activated Clotting Time: 248 seconds
Activated Clotting Time: 254 seconds
Activated Clotting Time: 289 seconds

## 2021-01-09 LAB — GLUCOSE, CAPILLARY: Glucose-Capillary: 154 mg/dL — ABNORMAL HIGH (ref 70–99)

## 2021-01-09 SURGERY — LEFT HEART CATH AND CORONARY ANGIOGRAPHY
Anesthesia: LOCAL

## 2021-01-09 MED ORDER — HYDRALAZINE HCL 20 MG/ML IJ SOLN: 10.0000 mg | INTRAMUSCULAR | Status: DC | PRN

## 2021-01-09 MED ORDER — SODIUM CHLORIDE 0.9 % IV SOLN: 250.0000 mL | INTRAVENOUS | Status: DC | PRN

## 2021-01-09 MED ORDER — SODIUM CHLORIDE 0.9% FLUSH: 3.0000 mL | Freq: Two times a day (BID) | INTRAVENOUS | Status: DC

## 2021-01-09 MED ORDER — TICAGRELOR 90 MG PO TABS: 90.0000 mg | ORAL_TABLET | Freq: Two times a day (BID) | ORAL | Status: DC

## 2021-01-09 MED ORDER — SODIUM CHLORIDE 0.9 % WEIGHT BASED INFUSION: 1.0000 mL/kg/h | INTRAVENOUS | Status: DC

## 2021-01-09 MED ORDER — LIDOCAINE HCL (PF) 1 % IJ SOLN
INTRAMUSCULAR | Status: DC | PRN
  Administered 2021-01-09: 2 mL

## 2021-01-09 MED ORDER — TICAGRELOR 90 MG PO TABS
ORAL_TABLET | ORAL | Status: DC | PRN
  Administered 2021-01-09: 180 mg via ORAL

## 2021-01-09 MED ORDER — SODIUM CHLORIDE 0.9 % IV SOLN: INTRAVENOUS | Status: DC

## 2021-01-09 MED ORDER — FENTANYL CITRATE (PF) 100 MCG/2ML IJ SOLN
INTRAMUSCULAR | Status: DC | PRN
  Administered 2021-01-09: 50 ug via INTRAVENOUS
  Administered 2021-01-09: 25 ug via INTRAVENOUS

## 2021-01-09 MED ORDER — NITROGLYCERIN 1 MG/10 ML FOR IR/CATH LAB
INTRA_ARTERIAL | Status: AC
  Filled 2021-01-09: qty 10

## 2021-01-09 MED ORDER — ASPIRIN 81 MG PO CHEW: 81.0000 mg | CHEWABLE_TABLET | Freq: Every day | ORAL | Status: DC

## 2021-01-09 MED ORDER — AMLODIPINE BESYLATE 5 MG PO TABS
5.0000 mg | ORAL_TABLET | Freq: Every day | ORAL | 1 refills | Status: DC
  Filled 2021-01-09: qty 90, 90d supply, fill #0

## 2021-01-09 MED ORDER — HEPARIN (PORCINE) IN NACL 1000-0.9 UT/500ML-% IV SOLN
INTRAVENOUS | Status: DC | PRN
  Administered 2021-01-09 (×2): 500 mL

## 2021-01-09 MED ORDER — VERAPAMIL HCL 2.5 MG/ML IV SOLN
INTRAVENOUS | Status: AC
  Filled 2021-01-09: qty 2

## 2021-01-09 MED ORDER — ASPIRIN 81 MG PO CHEW: 81.0000 mg | CHEWABLE_TABLET | ORAL | Status: DC

## 2021-01-09 MED ORDER — SODIUM CHLORIDE 0.9% FLUSH: 3.0000 mL | INTRAVENOUS | Status: DC | PRN

## 2021-01-09 MED ORDER — HEPARIN SODIUM (PORCINE) 1000 UNIT/ML IJ SOLN
INTRAMUSCULAR | Status: DC | PRN
  Administered 2021-01-09: 2000 [IU] via INTRAVENOUS
  Administered 2021-01-09: 3500 [IU] via INTRAVENOUS
  Administered 2021-01-09: 2000 [IU] via INTRAVENOUS
  Administered 2021-01-09: 5000 [IU] via INTRAVENOUS
  Administered 2021-01-09: 3000 [IU] via INTRAVENOUS

## 2021-01-09 MED ORDER — HEPARIN (PORCINE) IN NACL 1000-0.9 UT/500ML-% IV SOLN
INTRAVENOUS | Status: AC
  Filled 2021-01-09: qty 1000

## 2021-01-09 MED ORDER — LABETALOL HCL 5 MG/ML IV SOLN: 10.0000 mg | INTRAVENOUS | Status: DC | PRN

## 2021-01-09 MED ORDER — MIDAZOLAM HCL 2 MG/2ML IJ SOLN
INTRAMUSCULAR | Status: AC
  Filled 2021-01-09: qty 2

## 2021-01-09 MED ORDER — IOHEXOL 350 MG/ML SOLN
INTRAVENOUS | Status: DC | PRN
  Administered 2021-01-09: 256 mL

## 2021-01-09 MED ORDER — DIAZEPAM 5 MG PO TABS: 5.0000 mg | ORAL_TABLET | ORAL | Status: DC | PRN

## 2021-01-09 MED ORDER — VERAPAMIL HCL 2.5 MG/ML IV SOLN
INTRAVENOUS | Status: DC | PRN
  Administered 2021-01-09 (×2): 10 mL via INTRA_ARTERIAL

## 2021-01-09 MED ORDER — TICAGRELOR 90 MG PO TABS
90.0000 mg | ORAL_TABLET | Freq: Two times a day (BID) | ORAL | 1 refills | Status: DC
  Filled 2021-01-09: qty 60, 30d supply, fill #0

## 2021-01-09 MED ORDER — LIDOCAINE HCL (PF) 1 % IJ SOLN
INTRAMUSCULAR | Status: AC
  Filled 2021-01-09: qty 30

## 2021-01-09 MED ORDER — FENTANYL CITRATE (PF) 100 MCG/2ML IJ SOLN
INTRAMUSCULAR | Status: AC
  Filled 2021-01-09: qty 2

## 2021-01-09 MED ORDER — HEPARIN SODIUM (PORCINE) 1000 UNIT/ML IJ SOLN
INTRAMUSCULAR | Status: AC
  Filled 2021-01-09: qty 1

## 2021-01-09 MED ORDER — SODIUM CHLORIDE 0.9 % IV SOLN
INTRAVENOUS | Status: AC | PRN
  Administered 2021-01-09: 125 mL/h via INTRAVENOUS

## 2021-01-09 MED ORDER — ONDANSETRON HCL 4 MG/2ML IJ SOLN: 4.0000 mg | Freq: Four times a day (QID) | INTRAMUSCULAR | Status: DC | PRN

## 2021-01-09 MED ORDER — ACETAMINOPHEN 325 MG PO TABS: 650.0000 mg | ORAL_TABLET | ORAL | Status: DC | PRN

## 2021-01-09 MED ORDER — NITROGLYCERIN 1 MG/10 ML FOR IR/CATH LAB
INTRA_ARTERIAL | Status: DC | PRN
  Administered 2021-01-09 (×3): 200 ug via INTRACORONARY

## 2021-01-09 MED ORDER — MIDAZOLAM HCL 2 MG/2ML IJ SOLN
INTRAMUSCULAR | Status: DC | PRN
  Administered 2021-01-09: 1 mg via INTRAVENOUS
  Administered 2021-01-09: 2 mg via INTRAVENOUS

## 2021-01-09 MED ORDER — SODIUM CHLORIDE 0.9 % WEIGHT BASED INFUSION
3.0000 mL/kg/h | INTRAVENOUS | Status: AC
  Administered 2021-01-09: 3 mL/kg/h via INTRAVENOUS

## 2021-01-09 SURGICAL SUPPLY — 22 items
BALLN SAPPHIRE 2.25X15 (BALLOONS) ×2
BALLN SAPPHIRE ~~LOC~~ 2.5X12 (BALLOONS) ×2 IMPLANT
BALLN WOLVERINE 2.00X10 (BALLOONS) ×2
BALLOON SAPPHIRE 2.25X15 (BALLOONS) ×1 IMPLANT
BALLOON WOLVERINE 2.00X10 (BALLOONS) ×1 IMPLANT
CATH INFINITI JR4 5F (CATHETERS) ×2 IMPLANT
CATH LAUNCHER 6FR EBU3.5 (CATHETERS) ×2 IMPLANT
CATH OPTITORQUE TIG 4.0 5F (CATHETERS) ×2 IMPLANT
CATH VISTA GUIDE 6FR XBLAD3.5 (CATHETERS) ×2 IMPLANT
DEVICE RAD TR BAND REGULAR (VASCULAR PRODUCTS) ×2 IMPLANT
GLIDESHEATH SLEND SS 6F .021 (SHEATH) ×2 IMPLANT
GUIDEWIRE INQWIRE 1.5J.035X260 (WIRE) ×1 IMPLANT
INQWIRE 1.5J .035X260CM (WIRE) ×2
KIT ENCORE 26 ADVANTAGE (KITS) ×2 IMPLANT
KIT HEART LEFT (KITS) ×2 IMPLANT
PACK CARDIAC CATHETERIZATION (CUSTOM PROCEDURE TRAY) ×2 IMPLANT
SHEATH PROBE COVER 6X72 (BAG) ×2 IMPLANT
STENT SYNERGY XD 2.50X16 (Permanent Stent) ×1 IMPLANT
SYNERGY XD 2.50X16 (Permanent Stent) ×2 IMPLANT
TRANSDUCER W/STOPCOCK (MISCELLANEOUS) ×2 IMPLANT
TUBING CIL FLEX 10 FLL-RA (TUBING) ×2 IMPLANT
WIRE COUGAR XT STRL 190CM (WIRE) ×2 IMPLANT

## 2021-01-09 NOTE — Interval H&P Note (Signed)
Cath Lab Visit (complete for each Cath Lab visit)  Clinical Evaluation Leading to the Procedure:   ACS: No.  Non-ACS:    Anginal Classification: CCS I  Anti-ischemic medical therapy: Minimal Therapy (1 class of medications)  Non-Invasive Test Results: Intermediate-risk stress test findings: cardiac mortality 1-3%/year  Prior CABG: No previous CABG      History and Physical Interval Note:  01/09/2021 10:45 AM  Ryan Ferguson  has presented today for surgery, with the diagnosis of elevator calcium score.  The various methods of treatment have been discussed with the patient and family. After consideration of risks, benefits and other options for treatment, the patient has consented to  Procedure(s): LEFT HEART CATH AND CORONARY ANGIOGRAPHY (N/A) as a surgical intervention.  The patient's history has been reviewed, patient examined, no change in status, stable for surgery.  I have reviewed the patient's chart and labs.  Questions were answered to the patient's satisfaction.     Shelva Majestic

## 2021-01-09 NOTE — Discharge Summary (Signed)
Discharge Summary for Same Day PCI   Patient ID: Ryan Ferguson MRN: [id number]; DOB: Aug 07, 1950  Admit date: 01/09/2021 Discharge date: 01/09/2021  Primary Care Provider: Cassandria Anger, MD  Primary Cardiologist: Candee Furbish, MD  Primary Electrophysiologist:  None   Discharge Diagnoses    Principal Problem:   Abnormal cardiac CT angiography Active Problems:   Hypertension   Hyperlipidemia  Diagnostic Studies/Procedures    Cardiac Catheterization 01/09/2021:  Mid Cx lesion is 30% stenosed.   1st Diag-2 lesion is 95% stenosed.   1st Diag-1 lesion is 80% stenosed.   2nd Diag lesion is 40% stenosed.   Mid LAD lesion is 40% stenosed.   A stent was successfully placed.   Post intervention, there is a 0% residual stenosis.   Post intervention, there is a 0% residual stenosis.   LV end diastolic pressure is normal.   There is evidence for moderate coronary calcification involving predominantly the LAD and left circumflex vessels.   The LAD gives rise to a proximal diagonal 1 vessel with mild calcification that has 80 and 95% stenoses immediately proximal to bifurcating into 2 large branches.  The proximal to mid LAD are more significantly calcified.  The second diagonal vessel has 40% stenosis.  There is a 40% stenosis immediately after the second diagonal takeoff.   The left circumflex vessel is a dominant vessel which has mild to moderate calcification.  There is 30% proximal stenosis.   RCA is a small normal nondominant vessel.   RECOMMENDATION: DAPT for minimum of 12 months.  With significant coronary calcification recommend changing pravastatin to rosuvastatin 40 mg in this patient with apparent intolerance to atorvastatin in the past.  Recommend increased medical therapy for concomitant CAD.  Optimal blood pressure control with target blood pressure less than 130/80 and ideal blood pressure less than 120/80.  Initial plan following the catheterization is possible  same-day discharge later today if patient remains stable; otherwise, DC in a.m with follow-up with Dr. Marlou Porch.   Diagnostic Dominance: Left    Intervention    _____________   History of Present Illness     Ryan Ferguson is a 70 y.o. male with PMH of DM, HTN and tobacco use who presented to the office with chest pain on exertion. Set up for outpatient coronary CTA which was abnormal with moderate to severe disease of the pLAD/diag. CT FFR done with significant stenosis of mid D1. Was seen back in the office on 7/19 to discuss the need for cath with Dr. Marlou Porch.    Hospital Course     The patient underwent cardiac cath as noted above with 80-90% lesion of 1st diag with PCI/DES x1. Plan for DAPT with ASA/\Brilinta for at least one year. Does have residual disease of 40% mLAD, 30% mLCx, 40% 2nd diag to be treated medically. The patient was seen by cardiac rehab while in short stay. There were no observed complications post cath. Radial cath site did have some bleeding initially post cath. Pressure was held and TR band readjusted with improvement. Radial site was re-evaluated prior to discharge and found to be stable with bruising but no hematoma at reassessment. Instructions/precautions regarding cath site care were given prior to discharge.  Ryan Ferguson was seen by Dr. Claiborne Billings and determined stable for discharge home. Follow up with our office has been arranged. Medications are listed below. Pertinent changes include addition of Brilinta, increased amlodipine to '5mg'$  daily, discussed with patient regarding possible switch to Crestor but he had  Crestor in the past and developed myalgias. Briefly discussed the option of PCSK9i prior to discharge.   _____________  Cath/PCI Registry Performance & Quality Measures: Aspirin prescribed? - Yes ADP Receptor Inhibitor (Plavix/Clopidogrel, Brilinta/Ticagrelor or Effient/Prasugrel) prescribed (includes medically managed patients)? - Yes High  Intensity Statin (Lipitor 40-'80mg'$  or Crestor 20-'40mg'$ ) prescribed? - No - intolerant with myaglias, tolerating pravachol For EF <40%, was ACEI/ARB prescribed? - Not Applicable (EF >/= [id number]) For EF <40%, Aldosterone Antagonist (Spironolactone or Eplerenone) prescribed? - Not Applicable (EF >/= [id number]) Cardiac Rehab Phase II ordered (Included Medically managed Patients)? - Yes  _____________   Discharge Vitals Blood pressure (!) 148/70, pulse 64, temperature 98 F (36.7 C), temperature source Oral, resp. rate 14, height '5\' 5"'$  (1.651 m), weight 68.9 kg, SpO2 94 %.  Filed Weights   01/09/21 0824  Weight: 68.9 kg    Last Labs & Radiologic Studies    CBC No results for input(s): WBC, NEUTROABS, HGB, HCT, MCV, PLT in the last 72 hours. Basic Metabolic Panel No results for input(s): NA, K, CL, CO2, GLUCOSE, BUN, CREATININE, CALCIUM, MG, PHOS in the last 72 hours. Liver Function Tests No results for input(s): AST, ALT, ALKPHOS, BILITOT, PROT, ALBUMIN in the last 72 hours. No results for input(s): LIPASE, AMYLASE in the last 72 hours. High Sensitivity Troponin:   No results for input(s): TROPONINIHS in the last 720 hours.  BNP Invalid input(s): POCBNP D-Dimer No results for input(s): DDIMER in the last 72 hours. Hemoglobin A1C No results for input(s): HGBA1C in the last 72 hours. Fasting Lipid Panel No results for input(s): CHOL, HDL, LDLCALC, TRIG, CHOLHDL, LDLDIRECT in the last 72 hours. Thyroid Function Tests No results for input(s): TSH, T4TOTAL, T3FREE, THYROIDAB in the last 72 hours.  Invalid input(s): FREET3 _____________  CARDIAC CATHETERIZATION  Result Date: 01/09/2021 Formatting of this result is different from the original.   Mid Cx lesion is 30% stenosed.   1st Diag-2 lesion is 95% stenosed.   1st Diag-1 lesion is 80% stenosed.   2nd Diag lesion is 40% stenosed.   Mid LAD lesion is 40% stenosed.   A stent was successfully placed.   Post intervention, there is a 0% residual  stenosis.   Post intervention, there is a 0% residual stenosis.   LV end diastolic pressure is normal. There is evidence for moderate coronary calcification involving predominantly the LAD and left circumflex vessels. The LAD gives rise to a proximal diagonal 1 vessel with mild calcification that has 80 and 95% stenoses immediately proximal to bifurcating into 2 large branches.  The proximal to mid LAD are more significantly calcified.  The second diagonal vessel has 40% stenosis.  There is a 40% stenosis immediately after the second diagonal takeoff. The left circumflex vessel is a dominant vessel which has mild to moderate calcification.  There is 30% proximal stenosis. RCA is a small normal nondominant vessel. RECOMMENDATION: DAPT for minimum of 12 months.  With significant coronary calcification recommend changing pravastatin to rosuvastatin 40 mg in this patient with apparent intolerance to atorvastatin in the past.  Recommend increased medical therapy for concomitant CAD.  Optimal blood pressure control with target blood pressure less than 130/80 and ideal blood pressure less than 120/80.  Initial plan following the catheterization is possible same-day discharge later today if patient remains stable; otherwise, DC in a.m with follow-up with Dr. Marlou Porch.   CT CORONARY MORPH W/CTA COR W/SCORE W/CA W/CM &/OR WO/CM  Addendum Date: 12/26/2020   ADDENDUM REPORT: 12/26/2020 11:01  HISTORY: 70 yo male with chest pain, nonspecific chest pain, abn calcium score EXAM: Cardiac/Coronary CTA TECHNIQUE: The patient was scanned on a [employer]. PROTOCOL: A 120 kV prospective scan was triggered in the descending thoracic aorta at 111 HU's. Axial non-contrast 3 mm slices were carried out through the heart. The data set was analyzed on a dedicated work station and scored using the [place]. Gantry rotation speed was 250 msecs and collimation was .6 mm. Beta blockade and 0.8 mg of sl NTG was given. The 3D data  set was reconstructed in 5% intervals of the 35-75 % of the R-R cycle. Diastolic phases were analyzed on a dedicated work station using MPR, MIP and VRT modes. The patient received 20m OMNIPAQUE IOHEXOL 350 MG/ML SOLN of contrast. FINDINGS: Quality: Very good, HR 55 Coronary calcium score: The patient's coronary artery calcium score is 1492, which places the patient in the 90th percentile. Coronary arteries: Normal coronary origins.  Right dominance. Right Coronary Artery: Dominant, moderate-sized vessel with large acute marginal branch. Gives off R-PDA and R-PLB branches. No disease. Left Main Coronary Artery: Calcified at the ostium with minimal 1-24% mixed stenosis (CADRADS1). Gives of the LAD and LCx arteries. Left Anterior Descending Coronary Artery: Large anterior vessel, heavily calcified proximally with moderate to possibly severe stenosis (CADRADS3). 2 large proximal diagonal branches, each with at least moderate proximal mixed stenosis (CADRADS3). Left Circumflex Artery: Large AV groove vessel with minimal to mild mixed 24-49% stenoses of the proximal and mid-vessel - there is likely mild mixed 24-49% distal stenosis (CADARADS2). Aorta: Normal size, 34 mm at the mid ascending aorta (level of the PA bifurcation) measured double oblique. Aortic atherosclerosis. No dissection. Aortic Valve: Trileaflet. Scattered leaflet and aortic root calcifications. Other findings: Normal pulmonary vein drainage into the left atrium. Normal left atrial appendage without a thrombus. Normal size of the pulmonary artery. IMPRESSION: 1. Moderate to possibly severe mixed coronary disease, primarily of the proximal LAD/Diagonals, CADRADS = 3. CT FFR will be performed and reported separately. 2. Coronary calcium score of 1492. This was 90th percentile for age and sex matched control. 3. Normal coronary origin with right dominance. 4. Aortic atherosclerosis with aortic root and leaflet calcifications. 5. Aggressive cardiovascular  risk reduction is recommended. Further evaluation may be warranted based on FFR results. Electronically Signed   By: KPixie CasinoM.D.   On: 12/26/2020 11:01   Result Date: 12/26/2020 EXAM: OVER-READ INTERPRETATION  CT CHEST The following report is an over-read performed by radiologist Dr. DVinnie Langtonof [facility], [place] 12/26/2020. This over-read does not include interpretation of cardiac or coronary anatomy or pathology. The coronary calcium score/coronary CTA interpretation by the cardiologist is attached. COMPARISON:  Chest CT 12/19/2018. FINDINGS: Atherosclerotic calcifications are noted in the thoracic aorta. Several tiny 2-3 mm pulmonary nodules are scattered throughout the lungs bilaterally, nonspecific but stable in size and number compared to prior study from 12/19/2018 considered definitively benign. Within the visualized portions of the thorax there are no other larger more suspicious appearing pulmonary nodules or masses, there is no acute consolidative airspace disease, no pleural effusions, no pneumothorax and no lymphadenopathy. Visualized portions of the upper abdomen are unremarkable. There are no aggressive appearing lytic or blastic lesions noted in the visualized portions of the skeleton. IMPRESSION: 1.  Aortic Atherosclerosis (ICD10-I70.0). Electronically Signed: By: DVinnie LangtonM.D. On: 12/26/2020 08:20   CT CORONARY FRACTIONAL FLOW RESERVE DATA PREP  Result Date: 12/26/2020 EXAM: CT FFR ANALYSIS CLINICAL DATA:  Chest pain/anginal equiv, 47yrCHD risk 10-20%, not treadmill candidate FINDINGS: FFRct analysis was performed on the original cardiac CT angiogram dataset. Diagrammatic representation of the FFRct analysis is provided in a separate PDF document in PACS. This dictation was created using the PDF document and an interactive 3D model of the results. 3D model is not available in the EMR/PACS. Normal FFR range is >0.80. 1. Left Main:  No significant stenosis. FFR =  0.99 2. LAD: No significant stenosis. Proximal FFR = 0.99, Mid FFR = 0.84, Distal FFR = 0.77 (tapering) 3. D1: Significant stenosis.  Proximal FFR = 0.99, Distal FFR = 0.55 4. D2: No significant stenosis. Proximal FFR = 0.93, Distal FFR = 0.87 5. LCX: No significant stenosis. Proximal FFR = , Distal FFR = 6. RCA: No significant stenosis. Proximal FFR = 1.00, Mid FFR = 0.95, Distal FFR = 0.93 IMPRESSION: 1. CT FFR demonstrates significant discrete stenosis of the mid D1 branch which is flow limiting. There is tapering flow limitation at the distal LAD, probably not suggestive of a discrete stenosis. 2. Consider cardiac catheterization or optimization of medical therapy flow-limiting stenosis of the diagonal branch. Electronically Signed   By: KPixie CasinoM.D.   On: 12/26/2020 14:19    Disposition   Pt is being discharged home today in good condition.  Follow-up Plans & Appointments     Follow-up Information     [facility].   Specialty: Cardiology Why: at 8am for your follow up appt with Dr. STami Ribasinformation: [id number] [phone]              Discharge Medications   Allergies as of 01/09/2021       Reactions   Hydrochlorothiazide Other (See Comments)   Unknown reaction   Lipitor [atorvastatin]    myalgias   Lisinopril Cough   Pravachol [pravastatin] Other (See Comments)   Muscle pain (currently tolerating 20 mg vs. 40 mg)        Medication List     TAKE these medications    amLODipine 5 MG tablet Commonly known as: NORVASC Take 1 tablet (5 mg total) by mouth daily.   aspirin EC 81 MG tablet Take 1 tablet (81 mg total) by mouth daily.   Brilinta 90 MG Tabs tablet Generic drug: ticagrelor Take 1 tablet (90 mg total) by mouth 2 (two) times daily.   cholecalciferol 1000 units tablet Commonly known as: VITAMIN D Take 1,000 Units by mouth  daily.   Euthyrox 112 MCG tablet Generic drug: levothyroxine TAKE 1 TABLET BY MOUTH IN THE MORNING   famotidine 40 MG tablet Commonly known as: PEPCID Take 40 mg by mouth daily as needed for heartburn or indigestion.   metFORMIN 1000 MG tablet Commonly known as: GLUCOPHAGE Take 1 tablet by mouth twice daily   nitroGLYCERIN 0.4 MG SL tablet Commonly known as: NITROSTAT Place 1 tablet (0.4 mg total) under the tongue every 5 (five) minutes as needed for chest pain.   olmesartan-hydrochlorothiazide 40-12.5 MG tablet Commonly known as: Benicar HCT Take 1 tablet by mouth daily.   ONE TOUCH ULTRA TEST test strip Generic drug: glucose blood USE TEST STRIPS TO TEST BLOOD SUGAR ONCE DAILY   pravastatin 20 MG tablet Commonly known as: PRAVACHOL Take 1 tablet (20 mg total) by mouth daily. What changed: when to take this   repaglinide 0.5 MG tablet Commonly known as: PRANDIN Take 1 tablet (0.5 mg  total) by mouth 3 (three) times daily before meals.        Allergies Allergies  Allergen Reactions   Hydrochlorothiazide Other (See Comments)    Unknown reaction   Lipitor [Atorvastatin]     myalgias   Lisinopril Cough   Pravachol [Pravastatin] Other (See Comments)    Muscle pain (currently tolerating 20 mg vs. 40 mg)   Outstanding Labs/Studies   N/a   Duration of Discharge Encounter   Greater than 30 minutes including physician time.  Signed, Reino Bellis, NP 01/09/2021, 4:28 PM

## 2021-01-09 NOTE — Progress Notes (Signed)
    Right radial re-assessment performed with negative Allen's test, no overt bleeding. There is a small hematoma which has been previously marked without significant growth. Denies numbness, tingling in the fingers. Good perfusion. Reinforced post cath care site care with patient and family. They understand and agree.    Kathyrn Drown NP-C [place] Pager: [phone]

## 2021-01-09 NOTE — Progress Notes (Signed)
Discussed stent, restrictions, importance of Brilinta, diet, exercise, NTG and CRPII with pt and wife. Receptive. Wife sometimes translated for pt. Will refer to [place]. Answered several appropriate questions. He quit smoking 10 years ago. [id number] Yves Dill CES, ACSM 3:25 PM 01/09/2021

## 2021-01-09 NOTE — Research (Signed)
Identify Informed Consent   Subject Name: Ryan Ferguson  Subject met inclusion and exclusion criteria.  The informed consent form, study requirements and expectations were reviewed with the subject and questions and concerns were addressed prior to the signing of the consent form.  The subject verbalized understanding of the trial requirements.  The subject agreed to participate in the Identify trial and signed the informed consent at 0922 on January 09, 2021.  The informed consent was obtained prior to performance of any protocol-specific procedures for the subject.  A copy of the signed informed consent was given to the subject and a copy was placed in the subject's medical record.   Jasmine Pang, RN BSN [facility] Cardiovascular Research & Education Direct Line: [phone]

## 2021-01-09 NOTE — Progress Notes (Signed)
Assessed patient at 1345 and noted new hematoma formation. Pressure applied for 30 minutes. PA notified. Cath lab called. Sherelle, RN to bedside to assess patient. TR band readjusted. Air changed in TR band from 13 cc to 11 cc. Hematoma soft and marked. Patient developed SOB. 2L O2 applied. Patient denied chest pain. Patient reported SOB improving. Patient to be admitted. Will continue to monitor.

## 2021-01-12 ENCOUNTER — Encounter (HOSPITAL_COMMUNITY): Payer: Self-pay | Admitting: Cardiovascular Disease

## 2021-01-14 ENCOUNTER — Telehealth (HOSPITAL_COMMUNITY): Payer: Self-pay

## 2021-01-14 ENCOUNTER — Other Ambulatory Visit (HOSPITAL_BASED_OUTPATIENT_CLINIC_OR_DEPARTMENT_OTHER): Payer: Self-pay

## 2021-01-14 NOTE — Telephone Encounter (Signed)
Called and spoke with pt in regards to CR, pt stated he is not interested at this time.   Closed referral 

## 2021-01-14 NOTE — Telephone Encounter (Signed)
Pt insurance is active and benefits verified through [employer]. Co-pay $0.00, DED $0.00/$0.00 met, out of pocket $5,000.00/$215.00 met, co-insurance 0%. No pre-authorization required. Josh/[employer], 01/14/21 @ 4:04PM, REF#[id number]   Will contact patient to see if he is interested in the Cardiac Rehab Program. If interested, patient will need to complete follow up appt. Once completed, patient will be contacted for scheduling upon review by the RN Navigator.  

## 2021-01-22 ENCOUNTER — Other Ambulatory Visit: Payer: Self-pay

## 2021-01-22 ENCOUNTER — Ambulatory Visit (INDEPENDENT_AMBULATORY_CARE_PROVIDER_SITE_OTHER): Payer: HMO

## 2021-01-22 ENCOUNTER — Ambulatory Visit (HOSPITAL_BASED_OUTPATIENT_CLINIC_OR_DEPARTMENT_OTHER): Payer: HMO | Admitting: Cardiology

## 2021-01-22 ENCOUNTER — Encounter (HOSPITAL_BASED_OUTPATIENT_CLINIC_OR_DEPARTMENT_OTHER): Payer: Self-pay | Admitting: Cardiology

## 2021-01-22 VITALS — BP 112/70 | HR 66 | Ht 65.0 in | Wt 150.0 lb

## 2021-01-22 DIAGNOSIS — Z955 Presence of coronary angioplasty implant and graft: Secondary | ICD-10-CM

## 2021-01-22 DIAGNOSIS — Z79899 Other long term (current) drug therapy: Secondary | ICD-10-CM

## 2021-01-22 DIAGNOSIS — E785 Hyperlipidemia, unspecified: Secondary | ICD-10-CM | POA: Diagnosis not present

## 2021-01-22 DIAGNOSIS — I25119 Atherosclerotic heart disease of native coronary artery with unspecified angina pectoris: Secondary | ICD-10-CM

## 2021-01-22 DIAGNOSIS — R002 Palpitations: Secondary | ICD-10-CM

## 2021-01-22 MED ORDER — ROSUVASTATIN CALCIUM 20 MG PO TABS: 20.0000 mg | ORAL_TABLET | Freq: Every day | ORAL | 3 refills | Status: DC

## 2021-01-22 NOTE — Patient Instructions (Signed)
Medication Instructions:  Please discontinue Pravachol (pravastatin) and start Crestor 20 mg a day. Continue all other medications as listed.  *If you need a refill on your cardiac medications before your next appointment, please call your pharmacy*  Lab Work: Please have fasting blood work in 3 months (Lipid/ALT)  If you have labs (blood work) drawn today and your tests are completely normal, you will receive your results only by: [place] (if you have MyChart) OR A paper copy in the mail If you have any lab test that is abnormal or we need to change your treatment, we will call you to review the results.  Testing/Procedures: [place] [place]- Long Term Monitor Instructions  Your physician has requested you wear a ZIO patch monitor for 14 days.  This is a single patch monitor. [employer] supplies one patch monitor per enrollment. Additional stickers are not available. Please do not apply patch if you will be having a Nuclear Stress Test,  Echocardiogram, Cardiac CT, MRI, or Chest Xray during the period you would be wearing the  monitor. The patch cannot be worn during these tests. You cannot remove and re-apply the  ZIO XT patch monitor.  Your ZIO patch monitor will be mailed 3 day USPS to your address on file. It may take 3-5 days  to receive your monitor after you have been enrolled.  Once you have received your monitor, please review the enclosed instructions. Your monitor  has already been registered assigning a specific monitor serial # to you.  Billing and Patient Assistance Program Information  We have supplied [employer] with any of your insurance information on file for billing purposes. [employer] offers a sliding scale Patient Assistance Program for patients that do not have  insurance, or whose insurance does not completely cover the cost of the ZIO monitor.  You must apply for the Patient Assistance Program to qualify for this discounted rate.  To apply, please call [employer] at  [phone], select option 4, select option 2, ask to apply for  Patient Assistance Program. Theodore Demark will ask your household income, and how many people  are in your household. They will quote your out-of-pocket cost based on that information.  [employer] will also be able to set up a 78-month interest-free payment plan if needed.  Applying the monitor   Shave hair from upper left chest.  Hold abrader disc by orange tab. Rub abrader in 40 strokes over the upper left chest as  indicated in your monitor instructions.  Clean area with 4 enclosed alcohol pads. Let dry.  Apply patch as indicated in monitor instructions. Patch will be placed under collarbone on left  side of chest with arrow pointing upward.  Rub patch adhesive wings for 2 minutes. Remove white label marked "1". Remove the white  label marked "2". Rub patch adhesive wings for 2 additional minutes.  While looking in a mirror, press and release button in center of patch. A small green light will  flash 3-4 times. This will be your only indicator that the monitor has been turned on.  Do not shower for the first 24 hours. You may shower after the first 24 hours.  Press the button if you feel a symptom. You will hear a small click. Record Date, Time and  Symptom in the Patient Logbook.  When you are ready to remove the patch, follow instructions on the last 2 pages of Patient  Logbook. Stick patch monitor onto the last page of Patient Logbook.  Place Patient Logbook  in the blue and white box. Use locking tab on box and tape box closed  securely. The blue and white box has prepaid postage on it. Please place it in the mailbox as  soon as possible. Your physician should have your test results approximately 7 days after the  monitor has been mailed back to [facility].  Call [facility] at [phone] if you have questions regarding  your ZIO XT patch monitor. Call them immediately if you see an orange light  blinking on your  monitor.  If your monitor falls off in less than 4 days, contact our Monitor department at [phone].  If your monitor becomes loose or falls off after 4 days call [employer] at [phone] for  suggestions on securing your monitor   Follow-Up: At [facility], you and your health needs are our priority.  As part of our continuing mission to provide you with exceptional heart care, we have created designated Provider Care Teams.  These Care Teams include your primary Cardiologist (physician) and Advanced Practice Providers (APPs -  Physician Assistants and Nurse Practitioners) who all work together to provide you with the care you need, when you need it.  We recommend signing up for the patient portal called "MyChart".  Sign up information is provided on this After Visit Summary.  MyChart is used to connect with patients for Virtual Visits (Telemedicine).  Patients are able to view lab/test results, encounter notes, upcoming appointments, etc.  Non-urgent messages can be sent to your provider as well.   To learn more about what you can do with MyChart, go to [email or web address].    You have been referred to Cardiac Rehab and will be contacted to be scheduled.  Your next appointment:   3 month(s)  The format for your next appointment:   In Person  Provider:   You will see one of the following Advanced Practice Providers on your designated Care Team:   Cecilie Kicks, NP Laurann Montana, NP   Thank you for choosing [facility]!!   Mediterranean Diet A Mediterranean diet refers to food and lifestyle choices that are based on the traditions of countries located on the [employer]. This way of eating has been shown to help prevent certain conditions and improve outcomes forpeople who have chronic diseases, like kidney disease and heart disease. What are tips for following this plan? Lifestyle Cook and eat meals together with your family, when  possible. Drink enough fluid to keep your urine clear or pale yellow. Be physically active every day. This includes: Aerobic exercise like running or swimming. Leisure activities like gardening, walking, or housework. Get 7-8 hours of sleep each night. If recommended by your health care provider, drink red wine in moderation. This means 1 glass a day for nonpregnant women and 2 glasses a day for men. A glass of wine equals 5 oz (150 mL). Reading food labels  Check the serving size of packaged foods. For foods such as rice and pasta, the serving size refers to the amount of cooked product, not dry. Check the total fat in packaged foods. Avoid foods that have saturated fat or trans fats. Check the ingredients list for added sugars, such as corn syrup.  Shopping At the grocery store, buy most of your food from the areas near the walls of the store. This includes: Fresh fruits and vegetables (produce). Grains, beans, nuts, and seeds. Some of these may be available in unpackaged forms or large amounts (in bulk). Fresh seafood.  Poultry and eggs. Low-fat dairy products. Buy whole ingredients instead of prepackaged foods. Buy fresh fruits and vegetables in-season from local farmers markets. Buy frozen fruits and vegetables in resealable bags. If you do not have access to quality fresh seafood, buy precooked frozen shrimp or canned fish, such as tuna, salmon, or sardines. Buy small amounts of raw or cooked vegetables, salads, or olives from the deli or salad bar at your store. Stock your pantry so you always have certain foods on hand, such as olive oil, canned tuna, canned tomatoes, rice, pasta, and beans. Cooking Cook foods with extra-virgin olive oil instead of using butter or other vegetable oils. Have meat as a side dish, and have vegetables or grains as your main dish. This means having meat in small portions or adding small amounts of meat to foods like pasta or stew. Use beans or  vegetables instead of meat in common dishes like chili or lasagna. Experiment with different cooking methods. Try roasting or broiling vegetables instead of steaming or sauteing them. Add frozen vegetables to soups, stews, pasta, or rice. Add nuts or seeds for added healthy fat at each meal. You can add these to yogurt, salads, or vegetable dishes. Marinate fish or vegetables using olive oil, lemon juice, garlic, and fresh herbs. Meal planning  Plan to eat 1 vegetarian meal one day each week. Try to work up to 2 vegetarian meals, if possible. Eat seafood 2 or more times a week. Have healthy snacks readily available, such as: Vegetable sticks with hummus. Greek yogurt. Fruit and nut trail mix. Eat balanced meals throughout the week. This includes: Fruit: 2-3 servings a day Vegetables: 4-5 servings a day Low-fat dairy: 2 servings a day Fish, poultry, or lean meat: 1 serving a day Beans and legumes: 2 or more servings a week Nuts and seeds: 1-2 servings a day Whole grains: 6-8 servings a day Extra-virgin olive oil: 3-4 servings a day Limit red meat and sweets to only a few servings a month  What are my food choices? Mediterranean diet Recommended Grains: Whole-grain pasta. Brown rice. Bulgar wheat. Polenta. Couscous. Whole-wheat bread. [place]. Vegetables: Artichokes. Beets. Broccoli. Cabbage. Carrots. Eggplant. Green beans. Chard. Kale. Spinach. Onions. Leeks. Peas. Squash. Tomatoes. Peppers. Radishes. Fruits: Apples. Apricots. Avocado. Berries. Bananas. Cherries. Dates. Figs. Grapes. Lemons. Melon. Oranges. Peaches. Plums. Pomegranate. Meats and other protein foods: Beans. Almonds. Sunflower seeds. Pine nuts. Peanuts. [place]. Salmon. Scallops. Shrimp. [place]. Tilapia. Clams. Oysters. Eggs. Dairy: Low-fat milk. Cheese. Greek yogurt. Beverages: Water. Red wine. Herbal tea. Fats and oils: Extra virgin olive oil. Avocado oil. Grape seed oil. Sweets and desserts: [place] yogurt with  honey. Baked apples. Poached pears. Trail mix. Seasoning and other foods: Basil. Cilantro. Coriander. Cumin. Mint. Parsley. Sage. Rosemary. Tarragon. Garlic. Oregano. Thyme. Pepper. Balsalmic vinegar. Tahini. Hummus. Tomato sauce. Olives. Mushrooms. Limit these Grains: Prepackaged pasta or rice dishes. Prepackaged cereal with added sugar. Vegetables: Deep fried potatoes (french fries). Fruits: Fruit canned in syrup. Meats and other protein foods: Beef. Pork. Lamb. Poultry with skin. Hot dogs. [place] Salines. Dairy: Ice cream. Sour cream. Whole milk. Beverages: Juice. Sugar-sweetened soft drinks. Beer. Liquor and spirits. Fats and oils: Butter. Canola oil. Vegetable oil. Beef fat (tallow). Lard. Sweets and desserts: Cookies. Cakes. Pies. Candy. Seasoning and other foods: Mayonnaise. Premade sauces and marinades. The items listed may not be a complete list. Talk with your dietitian aboutwhat dietary choices are right for you. Summary The Mediterranean diet includes both food and lifestyle choices. Eat a variety of  fresh fruits and vegetables, beans, nuts, seeds, and whole grains. Limit the amount of red meat and sweets that you eat. Talk with your health care provider about whether it is safe for you to drink red wine in moderation. This means 1 glass a day for nonpregnant women and 2 glasses a day for men. A glass of wine equals 5 oz (150 mL). This information is not intended to replace advice given to you by your health care provider. Make sure you discuss any questions you have with your healthcare provider. Document Revised: 02/05/2016 Document Reviewed: 01/29/2016 Elsevier Patient Education  [place].

## 2021-01-22 NOTE — Progress Notes (Unsigned)
Patient enrolled for [employer] to mail a 14 day ZIO XT to his address on file.

## 2021-01-22 NOTE — Progress Notes (Signed)
Cardiology Office Note:    Date:  01/22/2021   ID:  Ryan Ferguson, DOB 1951-02-16, MRN [id number]  PCP:  Ryan Anger, MD   [facility] Providers Cardiologist:  Ryan Furbish, MD     Referring MD: Ryan Anger, MD    History of Present Illness:    Ryan Ferguson is a 70 y.o. male here for the follow-up of coronary artery disease status post coronary calcium score 1492 that led to [facility] analysis that showed a diagonal stenosis of significance.  He underwent cardiac catheterization in July 2022 that confirmed this.  2 separate stents were placed in a large diagonal vessel.  His statin dose was intensified to Crestor.  Has comorbidities of diabetes chronic kidney disease  Overall doing well without any fevers chills nausea vomiting syncope bleeding.    Pulse 120 with working a little bit. Ready to fall down. He was offered cardiac rehab but he is not interested.  After lengthy conversation today about lifestyle modification learning from cardiac rehab he is willing to try this again.  I was emphatic secondary to lack of myocardial damage.  Emphasized that he needs to be extremely proactive in his lifestyle modification at this point.  Past Medical History:  Diagnosis Date   Chronic kidney disease    Colon polyps    Diabetes mellitus without complication ([place])    does not check cbg daily   Hypertension    Hyperthyroidism    Kidney stones     Past Surgical History:  Procedure Laterality Date   APPENDECTOMY     age 89   CORONARY STENT INTERVENTION N/A 01/09/2021   Procedure: CORONARY STENT INTERVENTION;  Surgeon: Troy Sine, MD;  Location: [place] [facility];  Service: Cardiovascular;  Laterality: N/A;   CYSTOSCOPY     LEFT HEART CATH AND CORONARY ANGIOGRAPHY N/A 01/09/2021   Procedure: LEFT HEART CATH AND CORONARY ANGIOGRAPHY;  Surgeon: Troy Sine, MD;  Location: [place] [facility];  Service: Cardiovascular;  Laterality: N/A;   LITHOTRIPSY     4-5  times    Current Medications: Current Meds  Medication Sig   amLODipine (NORVASC) 5 MG tablet Take 1 tablet (5 mg total) by mouth daily.   aspirin EC 81 MG tablet Take 1 tablet (81 mg total) by mouth daily.   cholecalciferol (VITAMIN D) 1000 UNITS tablet Take 1,000 Units by mouth daily.   EUTHYROX 112 MCG tablet TAKE 1 TABLET BY MOUTH IN THE MORNING   famotidine (PEPCID) 40 MG tablet Take 40 mg by mouth daily as needed for heartburn or indigestion.   metFORMIN (GLUCOPHAGE) 1000 MG tablet Take 1 tablet by mouth twice daily   nitroGLYCERIN (NITROSTAT) 0.4 MG SL tablet Place 1 tablet (0.4 mg total) under the tongue every 5 (five) minutes as needed for chest pain.   olmesartan-hydrochlorothiazide (BENICAR HCT) 40-12.5 MG tablet Take 1 tablet by mouth daily.   ONE TOUCH ULTRA TEST test strip USE TEST STRIPS TO TEST BLOOD SUGAR ONCE DAILY   repaglinide (PRANDIN) 0.5 MG tablet Take 1 tablet (0.5 mg total) by mouth 3 (three) times daily before meals.   rosuvastatin (CRESTOR) 20 MG tablet Take 1 tablet (20 mg total) by mouth daily.   ticagrelor (BRILINTA) 90 MG TABS tablet Take 1 tablet (90 mg total) by mouth 2 (two) times daily.   [DISCONTINUED] pravastatin (PRAVACHOL) 20 MG tablet Take 1 tablet (20 mg total) by mouth daily.     Allergies:   Hydrochlorothiazide,  Lipitor [atorvastatin], Lisinopril, and Pravachol [pravastatin]   Social History   Socioeconomic History   Marital status: Married    Spouse name: Not on file   Number of children: 3   Years of education: Not on file   Highest education level: Not on file  Occupational History   Not on file  Tobacco Use   Smoking status: Former    Packs/day: 1.00    Types: Cigarettes    Quit date: 10/25/2010    Years since quitting: 10.2   Smokeless tobacco: Never  Vaping Use   Vaping Use: Never used  Substance and Sexual Activity   Alcohol use: Yes    Comment: occasional   Drug use: No   Sexual activity: Not on file  Other Topics Concern    Not on file  Social History Narrative   Not on file   Social Determinants of Health   Financial Resource Strain: Low Risk    Difficulty of Paying Living Expenses: Not hard at all  Food Insecurity: Not on file  Transportation Needs: No Transportation Needs   Lack of Transportation (Medical): No   Lack of Transportation (Non-Medical): No  Physical Activity: Inactive   Days of Exercise per Week: 0 days   Minutes of Exercise per Session: 0 min  Stress: No Stress Concern Present   Feeling of Stress : Not at all  Social Connections: Unknown   Frequency of Communication with Friends and Family: More than three times a week   Frequency of Social Gatherings with Friends and Family: More than three times a week   Attends Religious Services: Patient refused   Marine scientist or Organizations: Patient refused   Attends Music therapist: Patient refused   Marital Status: Married     Family History: The patient's family history includes Diabetes in his maternal grandmother; Heart disease in his maternal grandfather, mother, and paternal grandmother; High Cholesterol in his father; Hypertension in his father; Kidney disease in his father; Lung cancer in his maternal grandfather; Lupus in his sister; Pancreatic cancer in his father; Thyroid disease in his mother; Varicose Veins in his father. There is no history of Colon cancer, Esophageal cancer, Stomach cancer, or Liver disease.  ROS:   Please see the history of present illness.     All other systems reviewed and are negative.  EKGs/Labs/Other Studies Reviewed:    The following studies were reviewed today: Cardiac catheterization 01/09/2021:  Diagnostic Dominance: Left    Intervention   CT coronary FFR on 12/26/2020 prior to catheterization: 1. CT FFR demonstrates significant discrete stenosis of the mid D1 branch which is flow limiting. There is tapering flow limitation at the distal LAD, probably not  suggestive of a discrete stenosis.   2. Consider cardiac catheterization or optimization of medical therapy flow-limiting stenosis of the diagonal branch.  EKG:  EKG is  ordered today.  The ekg ordered today demonstrates sinus rhythm 66 with nonspecific ST-T wave changes  Recent Labs: 12/03/2020: ALT 17; TSH 0.55 01/06/2021: BUN 21; Creatinine, Ser 0.94; Hemoglobin 13.9; Platelets 235; Potassium 3.9; Sodium 136  Recent Lipid Panel    Component Value Date/Time   CHOL 219 (H) 12/03/2020 0856   TRIG 175.0 (H) 12/03/2020 0856   HDL 47.50 12/03/2020 0856   CHOLHDL 5 12/03/2020 0856   VLDL 35.0 12/03/2020 0856   LDLCALC 137 (H) 12/03/2020 0856     Risk Assessment/Calculations:          Physical Exam:  VS:  BP 112/70 (BP Location: Left Arm, Patient Position: Sitting, Cuff Size: Normal)   Pulse 66   Ht '5\' 5"'$  (1.651 m)   Wt 150 lb (68 kg)   BMI 24.96 kg/m     Wt Readings from Last 3 Encounters:  01/22/21 150 lb (68 kg)  01/09/21 152 lb (68.9 kg)  01/06/21 154 lb (69.9 kg)     GEN:  Well nourished, well developed in no acute distress HEENT: Normal NECK: No JVD; No carotid bruits LYMPHATICS: No lymphadenopathy CARDIAC: RRR, no murmurs, rubs, gallops RESPIRATORY:  Clear to auscultation without rales, wheezing or rhonchi  ABDOMEN: Soft, non-tender, non-distended MUSCULOSKELETAL:  No edema; No deformity, minor bruising forearm radial site.  Overall doing well. SKIN: Warm and dry NEUROLOGIC:  Alert and oriented x 3 PSYCHIATRIC:  Normal affect   ASSESSMENT:    1. Coronary artery disease involving native coronary artery of native heart with angina pectoris ([place])   2. Status post coronary artery stent placement   3. Hyperlipidemia, unspecified hyperlipidemia type   4. Medication management   5. Palpitations    PLAN:    In order of problems listed above:  Coronary artery disease - Cardiac catheterization July 22/2022 resulted in 2 diagonal stents.  Otherwise moderate  nonobstructive disease in the LAD. - Continue dual antiplatelet therapy for at least 6 months. - Statin has been increased to high intensity Crestor '20mg'$ .  He has had trouble in the past with atorvastatin. -Coronary CT showed calcium score 1492. -Prior exertional anginal symptoms.  Palpitations --noted heart rate 120 bpm. Quit BP medication when pulse went up that high.  Encouraged him to continue with his blood pressure medication.  Diabetes with chronic kidney disease - Continue to work with Dr. Alain Marion.  Avoid NSAIDs.  On angiotensin receptor blocker olmesartan.  40 mg, no change.  Hyperlipidemia - Intensified statin therapy from Pravachol to Crestor.  Former smoker - Quit over 10 years ago.  I am comfortable with him performing physical activity. Cardiac rehab called him and he refused initially.  I have talked with him today, he is willing to proceed.  His wife is asking about cardiac dietary modification.  I said that the cardiac rehab program would be a great way for him to learn about lifestyle modification as well.     Medication Adjustments/Labs and Tests Ordered: Current medicines are reviewed at length with the patient today.  Concerns regarding medicines are outlined above.  Orders Placed This Encounter  Procedures   ALT   Lipid panel   AMB referral to cardiac rehabilitation   LONG TERM MONITOR (3-14 DAYS)   EKG 12-Lead    Meds ordered this encounter  Medications   rosuvastatin (CRESTOR) 20 MG tablet    Sig: Take 1 tablet (20 mg total) by mouth daily.    Dispense:  90 tablet    Refill:  3     Patient Instructions  Medication Instructions:  Please discontinue Pravachol (pravastatin) and start Crestor 20 mg a day. Continue all other medications as listed.  *If you need a refill on your cardiac medications before your next appointment, please call your pharmacy*  Lab Work: Please have fasting blood work in 3 months (Lipid/ALT)  If you have labs (blood  work) drawn today and your tests are completely normal, you will receive your results only by: [place] (if you have MyChart) OR A paper copy in the mail If you have any lab test that is abnormal or we need  to change your treatment, we will call you to review the results.  Testing/Procedures: [place]- Long Term Monitor Instructions  Your physician has requested you wear a ZIO patch monitor for 14 days.  This is a single patch monitor. [employer] supplies one patch monitor per enrollment. Additional stickers are not available. Please do not apply patch if you will be having a Nuclear Stress Test,  Echocardiogram, Cardiac CT, MRI, or Chest Xray during the period you would be wearing the  monitor. The patch cannot be worn during these tests. You cannot remove and re-apply the  ZIO XT patch monitor.  Your ZIO patch monitor will be mailed 3 day USPS to your address on file. It may take 3-5 days  to receive your monitor after you have been enrolled.  Once you have received your monitor, please review the enclosed instructions. Your monitor  has already been registered assigning a specific monitor serial # to you.  Billing and Patient Assistance Program Information  We have supplied [employer] with any of your insurance information on file for billing purposes. [employer] offers a sliding scale Patient Assistance Program for patients that do not have  insurance, or whose insurance does not completely cover the cost of the ZIO monitor.  You must apply for the Patient Assistance Program to qualify for this discounted rate.  To apply, please call [employer] at [phone], select option 4, select option 2, ask to apply for  Patient Assistance Program. Theodore Demark will ask your household income, and how many people  are in your household. They will quote your out-of-pocket cost based on that information.  [employer] will also be able to set up a 25-month interest-free payment plan if needed.  Applying the  monitor   Shave hair from upper left chest.  Hold abrader disc by orange tab. Rub abrader in 40 strokes over the upper left chest as  indicated in your monitor instructions.  Clean area with 4 enclosed alcohol pads. Let dry.  Apply patch as indicated in monitor instructions. Patch will be placed under collarbone on left  side of chest with arrow pointing upward.  Rub patch adhesive wings for 2 minutes. Remove white label marked "1". Remove the white  label marked "2". Rub patch adhesive wings for 2 additional minutes.  While looking in a mirror, press and release button in center of patch. A small green light will  flash 3-4 times. This will be your only indicator that the monitor has been turned on.  Do not shower for the first 24 hours. You may shower after the first 24 hours.  Press the button if you feel a symptom. You will hear a small click. Record Date, Time and  Symptom in the Patient Logbook.  When you are ready to remove the patch, follow instructions on the last 2 pages of Patient  Logbook. Stick patch monitor onto the last page of Patient Logbook.  Place Patient Logbook in the blue and white box. Use locking tab on box and tape box closed  securely. The blue and white box has prepaid postage on it. Please place it in the mailbox as  soon as possible. Your physician should have your test results approximately 7 days after the  monitor has been [facility]  Call IMooretonat [phone] you have questions regarding  your ZIO XT patch monitor. Call them immediately if you see an orange light blinking on your  monitor.  If your monitor falls off  in less than 4 days, contact our Monitor department at [phone].  If your monitor becomes loose or falls off after 4 days call [employer] at [phone] for  suggestions on securing your monitor   Follow-Up: At [facility], you and your health needs are our priority.  As part of our  continuing mission to provide you with exceptional heart care, we have created designated Provider Care Teams.  These Care Teams include your primary Cardiologist (physician) and Advanced Practice Providers (APPs -  Physician Assistants and Nurse Practitioners) who all work together to provide you with the care you need, when you need it.  We recommend signing up for the patient portal called "MyChart".  Sign up information is provided on this After Visit Summary.  MyChart is used to connect with patients for Virtual Visits (Telemedicine).  Patients are able to view lab/test results, encounter notes, upcoming appointments, etc.  Non-urgent messages can be sent to your provider as well.   To learn more about what you can do with MyChart, go to [email or web address].    You have been referred to Cardiac Rehab and will be contacted to be scheduled.  Your next appointment:   3 month(s)  The format for your next appointment:   In Person  Provider:   You will see one of the following Advanced Practice Providers on your designated Care Team:   Cecilie Kicks, NP Laurann Montana, NP   Thank you for choosing [place]!!   Mediterranean Diet A Mediterranean diet refers to food and lifestyle choices that are based on the traditions of countries located on the [employer]. This way of eating has been shown to help prevent certain conditions and improve outcomes forpeople who have chronic diseases, like kidney disease and heart disease. What are tips for following this plan? Lifestyle Cook and eat meals together with your family, when possible. Drink enough fluid to keep your urine clear or pale yellow. Be physically active every day. This includes: Aerobic exercise like running or swimming. Leisure activities like gardening, walking, or housework. Get 7-8 hours of sleep each night. If recommended by your health care provider, drink red wine in moderation. This means 1 glass a day  for nonpregnant women and 2 glasses a day for men. A glass of wine equals 5 oz (150 mL). Reading food labels  Check the serving size of packaged foods. For foods such as rice and pasta, the serving size refers to the amount of cooked product, not dry. Check the total fat in packaged foods. Avoid foods that have saturated fat or trans fats. Check the ingredients list for added sugars, such as corn syrup.  Shopping At the grocery store, buy most of your food from the areas near the walls of the store. This includes: Fresh fruits and vegetables (produce). Grains, beans, nuts, and seeds. Some of these may be available in unpackaged forms or large amounts (in bulk). Fresh seafood. Poultry and eggs. Low-fat dairy products. Buy whole ingredients instead of prepackaged foods. Buy fresh fruits and vegetables in-season from local farmers markets. Buy frozen fruits and vegetables in resealable bags. If you do not have access to quality fresh seafood, buy precooked frozen shrimp or canned fish, such as tuna, salmon, or sardines. Buy small amounts of raw or cooked vegetables, salads, or olives from the deli or salad bar at your store. Stock your pantry so you always have certain foods on hand, such as olive oil, canned tuna, canned tomatoes, rice, pasta, and beans.  Cooking Cook foods with extra-virgin olive oil instead of using butter or other vegetable oils. Have meat as a side dish, and have vegetables or grains as your main dish. This means having meat in small portions or adding small amounts of meat to foods like pasta or stew. Use beans or vegetables instead of meat in common dishes like chili or lasagna. Experiment with different cooking methods. Try roasting or broiling vegetables instead of steaming or sauteing them. Add frozen vegetables to soups, stews, pasta, or rice. Add nuts or seeds for added healthy fat at each meal. You can add these to yogurt, salads, or vegetable dishes. Marinate  fish or vegetables using olive oil, lemon juice, garlic, and fresh herbs. Meal planning  Plan to eat 1 vegetarian meal one day each week. Try to work up to 2 vegetarian meals, if possible. Eat seafood 2 or more times a week. Have healthy snacks readily available, such as: Vegetable sticks with hummus. Greek yogurt. Fruit and nut trail mix. Eat balanced meals throughout the week. This includes: Fruit: 2-3 servings a day Vegetables: 4-5 servings a day Low-fat dairy: 2 servings a day Fish, poultry, or lean meat: 1 serving a day Beans and legumes: 2 or more servings a week Nuts and seeds: 1-2 servings a day Whole grains: 6-8 servings a day Extra-virgin olive oil: 3-4 servings a day Limit red meat and sweets to only a few servings a month  What are my food choices? Mediterranean diet Recommended Grains: Whole-grain pasta. Brown rice. Bulgar wheat. Polenta. Couscous. Whole-wheat bread. [place]. Vegetables: Artichokes. Beets. Broccoli. Cabbage. Carrots. Eggplant. Green beans. Chard. Kale. Spinach. Onions. Leeks. Peas. Squash. Tomatoes. Peppers. Radishes. Fruits: Apples. Apricots. Avocado. Berries. Bananas. Cherries. Dates. Figs. Grapes. Lemons. Melon. Oranges. Peaches. Plums. Pomegranate. Meats and other protein foods: Beans. Almonds. Sunflower seeds. Pine nuts. Peanuts. [place]. Salmon. Scallops. Shrimp. [place]. Tilapia. Clams. Oysters. Eggs. Dairy: Low-fat milk. Cheese. Greek yogurt. Beverages: Water. Red wine. Herbal tea. Fats and oils: Extra virgin olive oil. Avocado oil. Grape seed oil. Sweets and desserts: [place] yogurt with honey. Baked apples. Poached pears. Trail mix. Seasoning and other foods: Basil. Cilantro. Coriander. Cumin. Mint. Parsley. Sage. Rosemary. Tarragon. Garlic. Oregano. Thyme. Pepper. Balsalmic vinegar. Tahini. Hummus. Tomato sauce. Olives. Mushrooms. Limit these Grains: Prepackaged pasta or rice dishes. Prepackaged cereal with added sugar. Vegetables: Deep fried  potatoes (french fries). Fruits: Fruit canned in syrup. Meats and other protein foods: Beef. Pork. Lamb. Poultry with skin. Hot dogs. Berniece Salines. Dairy: Ice cream. Sour cream. Whole milk. Beverages: Juice. Sugar-sweetened soft drinks. Beer. Liquor and spirits. Fats and oils: Butter. Canola oil. Vegetable oil. Beef fat (tallow). Lard. Sweets and desserts: Cookies. Cakes. Pies. Candy. Seasoning and other foods: Mayonnaise. Premade sauces and marinades. The items listed may not be a complete list. Talk with your dietitian aboutwhat dietary choices are right for you. Summary The Mediterranean diet includes both food and lifestyle choices. Eat a variety of fresh fruits and vegetables, beans, nuts, seeds, and whole grains. Limit the amount of red meat and sweets that you eat. Talk with your health care provider about whether it is safe for you to drink red wine in moderation. This means 1 glass a day for nonpregnant women and 2 glasses a day for men. A glass of wine equals 5 oz (150 mL). This information is not intended to replace advice given to you by your health care provider. Make sure you discuss any questions you have with your healthcare provider. Document Revised: 02/05/2016 Document  Reviewed: 01/29/2016 Elsevier Patient Education  [place].   Signed, Ryan Furbish, MD  01/22/2021 9:16 AM    [place]

## 2021-01-26 DIAGNOSIS — R002 Palpitations: Secondary | ICD-10-CM | POA: Diagnosis not present

## 2021-01-26 DIAGNOSIS — I25119 Atherosclerotic heart disease of native coronary artery with unspecified angina pectoris: Secondary | ICD-10-CM

## 2021-01-26 DIAGNOSIS — Z955 Presence of coronary angioplasty implant and graft: Secondary | ICD-10-CM | POA: Diagnosis not present

## 2021-02-03 ENCOUNTER — Other Ambulatory Visit (HOSPITAL_COMMUNITY): Payer: Self-pay

## 2021-02-03 ENCOUNTER — Telehealth (HOSPITAL_COMMUNITY): Payer: Self-pay

## 2021-02-03 NOTE — Telephone Encounter (Signed)
Pharmacy Transitions of Care Follow-up Telephone Call  Date of discharge: 01/09/21  Discharge Diagnosis: Stent placement  How have you been since you were released from the hospital?  Spoke with wife on the phone. Patient has been doing well since discharge. No questions about meds at this time.  Medication changes made at discharge:  - START: Brilinta  Medication changes verified by the patient? Yes    Medication Accessibility:  Home Pharmacy:  [facility]  Was the patient provided with refills on discharged medications? Yes   Have all prescriptions been transferred from [facility] to home pharmacy?  yes  Is the patient able to afford medications? Patient has [employer]    Medication Review:  TICAGRELOR (BRILINTA) Ticagrelor 90 mg BID initiated on 01/09/21.  - Educated patient on expected duration of therapy of aspirin with ticagrelor.  - Discussed importance of taking medication around the same time every day, - Advised patient of medications to avoid (NSAIDs, aspirin maintenance doses>100 mg daily) - Educated that Tylenol (acetaminophen) will be the preferred analgesic to prevent risk of bleeding  - Emphasized importance of monitoring for signs and symptoms of bleeding (abnormal bruising, prolonged bleeding, nose bleeds, bleeding from gums, discolored urine, black tarry stools)  - Educated patient to notify doctor if shortness of breath or abnormal heartbeat occur - Advised patient to alert all providers of antiplatelet therapy prior to starting a new medication or having a procedure    Follow-up Appointments:  PCP Hospital f/u appt confirmed? Scheduled to see Dr. Alain Marion on 03/05/21 @ Internal Med.   [facility] f/u appt confirmed? Scheduled to see Dr. Marlou Porch on 01/22/21 @ Cardiology.   If their condition worsens, is the pt aware to call PCP or go to the Emergency Dept.? Yes  Final Patient Assessment: Patient has had follow ups and  has refills at home pharmacy

## 2021-03-05 ENCOUNTER — Telehealth (HOSPITAL_COMMUNITY): Payer: Self-pay

## 2021-03-05 ENCOUNTER — Ambulatory Visit (INDEPENDENT_AMBULATORY_CARE_PROVIDER_SITE_OTHER): Payer: HMO | Admitting: Internal Medicine

## 2021-03-05 ENCOUNTER — Encounter: Payer: Self-pay | Admitting: Internal Medicine

## 2021-03-05 ENCOUNTER — Other Ambulatory Visit: Payer: Self-pay

## 2021-03-05 DIAGNOSIS — E118 Type 2 diabetes mellitus with unspecified complications: Secondary | ICD-10-CM

## 2021-03-05 DIAGNOSIS — I209 Angina pectoris, unspecified: Secondary | ICD-10-CM

## 2021-03-05 DIAGNOSIS — I700 Atherosclerosis of aorta: Secondary | ICD-10-CM

## 2021-03-05 DIAGNOSIS — K219 Gastro-esophageal reflux disease without esophagitis: Secondary | ICD-10-CM

## 2021-03-05 DIAGNOSIS — E1159 Type 2 diabetes mellitus with other circulatory complications: Secondary | ICD-10-CM

## 2021-03-05 DIAGNOSIS — I25118 Atherosclerotic heart disease of native coronary artery with other forms of angina pectoris: Secondary | ICD-10-CM

## 2021-03-05 LAB — COMPREHENSIVE METABOLIC PANEL
ALT: 50 U/L (ref 0–53)
AST: 29 U/L (ref 0–37)
Albumin: 4.4 g/dL (ref 3.5–5.2)
Alkaline Phosphatase: 50 U/L (ref 39–117)
BUN: 23 mg/dL (ref 6–23)
CO2: 26 mEq/L (ref 19–32)
Calcium: 10.1 mg/dL (ref 8.4–10.5)
Chloride: 101 mEq/L (ref 96–112)
Creatinine, Ser: 1.16 mg/dL (ref 0.40–1.50)
GFR: 63.79 mL/min (ref >60.00)
Glucose, Bld: 187 mg/dL — ABNORMAL HIGH (ref 70–99)
Potassium: 3.8 mEq/L (ref 3.5–5.1)
Sodium: 137 mEq/L (ref 135–145)
Total Bilirubin: 0.5 mg/dL (ref 0.2–1.2)
Total Protein: 7.2 g/dL (ref 6.0–8.3)

## 2021-03-05 LAB — TSH: TSH: 0.1 u[IU]/mL — ABNORMAL LOW (ref 0.35–5.50)

## 2021-03-05 LAB — HEMOGLOBIN A1C: Hgb A1c MFr Bld: 7.5 % — ABNORMAL HIGH (ref 4.6–6.5)

## 2021-03-05 NOTE — Assessment & Plan Note (Signed)
On statin.

## 2021-03-05 NOTE — Assessment & Plan Note (Signed)
Re-start Pepcid

## 2021-03-05 NOTE — Telephone Encounter (Signed)
Called and spoke with pt in regards to CR, pt stated he is not interested at this time.   Closed referral 

## 2021-03-05 NOTE — Telephone Encounter (Signed)
Pt insurance is active and benefits verified through [employer]. Co-pay $0.00, DED $0.00/$0.00 met, out of pocket $5,000.00/$448.09 met, co-insurance 0%. No pre-authorization required. April/[employer]A, 03/05/21 @ 9:43AM, OIT#[id number]   Will contact patient to see if he is interested in the Cardiac Rehab Program.

## 2021-03-05 NOTE — Assessment & Plan Note (Addendum)
CAD, angina - exertional R arm pain and fatigue, GERD status post coronary calcium score 1492 that led to [facility] analysis that showed a diagonal stenosis of significance.  He underwent cardiac catheterization in July 2022 that confirmed this.  2 separate stents were placed in a large diagonal vessel.  His statin dose was intensified to Crestor.  However, Crestor may be causing more arthralgias/myalgias. Not taking - Take NTG prn F/u w/cardiology - will move up his appt Refused flu shot

## 2021-03-05 NOTE — Assessment & Plan Note (Addendum)
Check A1c Cont on Prandin 

## 2021-03-05 NOTE — Assessment & Plan Note (Signed)
CAD, angina - exertional R arm pain and fatigue, GERD status post coronary calcium score 1492 that led to FFR analysis that showed a diagonal stenosis of significance.  He underwent cardiac catheterization in July 2022 that confirmed this.  2 separate stents were placed in a large diagonal vessel.  His statin dose was intensified to Crestor. 

## 2021-03-05 NOTE — Progress Notes (Signed)
Subjective:  Patient ID: Ryan Ferguson, male    DOB: Jan 12, 1951  Age: 70 y.o. MRN: [id number]  CC: Follow-up (3 month f/u)   HPI Ryan Ferguson presents for CAD, angina - exertional R arm pain and fatigue, GERD F/u on coronary artery disease status post coronary calcium score 1492 that led to [facility] analysis that showed a diagonal stenosis of significance.  He underwent cardiac catheterization in July 2022 that confirmed this.  2 separate stents were placed in a large diagonal vessel.  His statin dose was intensified to Crestor.  F/u DM  Outpatient Medications Prior to Visit  Medication Sig Dispense Refill   amLODipine (NORVASC) 5 MG tablet Take 1 tablet (5 mg total) by mouth daily. 90 tablet 1   aspirin EC 81 MG tablet Take 1 tablet (81 mg total) by mouth daily. 90 tablet 3   cholecalciferol (VITAMIN D) 1000 UNITS tablet Take 1,000 Units by mouth daily.     EUTHYROX 112 MCG tablet TAKE 1 TABLET BY MOUTH IN THE MORNING 90 tablet 1   famotidine (PEPCID) 40 MG tablet Take 40 mg by mouth daily as needed for heartburn or indigestion.     metFORMIN (GLUCOPHAGE) 1000 MG tablet Take 1 tablet by mouth twice daily 180 tablet 3   nitroGLYCERIN (NITROSTAT) 0.4 MG SL tablet Place 1 tablet (0.4 mg total) under the tongue every 5 (five) minutes as needed for chest pain. 50 tablet 3   olmesartan-hydrochlorothiazide (BENICAR HCT) 40-12.5 MG tablet Take 1 tablet by mouth daily. 90 tablet 3   ONE TOUCH ULTRA TEST test strip USE TEST STRIPS TO TEST BLOOD SUGAR ONCE DAILY  4   repaglinide (PRANDIN) 0.5 MG tablet Take 1 tablet (0.5 mg total) by mouth 3 (three) times daily before meals. 90 tablet 3   rosuvastatin (CRESTOR) 20 MG tablet Take 1 tablet (20 mg total) by mouth daily. 90 tablet 3   ticagrelor (BRILINTA) 90 MG TABS tablet Take 1 tablet (90 mg total) by mouth 2 (two) times daily. 180 tablet 1   No facility-administered medications prior to visit.    ROS: Review of Systems  Constitutional:   Positive for fatigue. Negative for appetite change and unexpected weight change.  HENT:  Negative for congestion, nosebleeds, sneezing, sore throat and trouble swallowing.   Eyes:  Negative for itching and visual disturbance.  Respiratory:  Negative for cough.   Cardiovascular:  Positive for chest pain. Negative for palpitations and leg swelling.  Gastrointestinal:  Negative for abdominal distention, blood in stool, diarrhea and nausea.  Genitourinary:  Negative for frequency and hematuria.  Musculoskeletal:  Positive for arthralgias. Negative for back pain, gait problem, joint swelling and neck pain.  Skin:  Negative for rash.  Neurological:  Negative for dizziness, tremors, speech difficulty and weakness.  Psychiatric/Behavioral:  Negative for agitation, dysphoric mood and sleep disturbance. The patient is not nervous/anxious.    Objective:  BP 138/62 (BP Location: Left Arm)   Pulse 60   Temp 97.9 F (36.6 C) (Oral)   Ht '5\' 5"'$  (1.651 m)   Wt 150 lb 9.6 oz (68.3 kg)   SpO2 97%   BMI 25.06 kg/m   BP Readings from Last 3 Encounters:  03/05/21 138/62  01/22/21 112/70  01/09/21 (!) 168/96    Wt Readings from Last 3 Encounters:  03/05/21 150 lb 9.6 oz (68.3 kg)  01/22/21 150 lb (68 kg)  01/09/21 152 lb (68.9 kg)    Physical Exam Constitutional:  General: He is not in acute distress.    Appearance: He is well-developed.     Comments: NAD  Eyes:     Conjunctiva/sclera: Conjunctivae normal.     Pupils: Pupils are equal, round, and reactive to light.  Neck:     Thyroid: No thyromegaly.     Vascular: No JVD.  Cardiovascular:     Rate and Rhythm: Normal rate and regular rhythm.     Heart sounds: Normal heart sounds. No murmur heard.   No friction rub. No gallop.  Pulmonary:     Effort: Pulmonary effort is normal. No respiratory distress.     Breath sounds: Normal breath sounds. No wheezing or rales.  Chest:     Chest wall: No tenderness.  Abdominal:     General:  Bowel sounds are normal. There is no distension.     Palpations: Abdomen is soft. There is no mass.     Tenderness: There is no abdominal tenderness. There is no guarding or rebound.  Musculoskeletal:        General: Tenderness present. Normal range of motion.     Cervical back: Normal range of motion.  Lymphadenopathy:     Cervical: No cervical adenopathy.  Skin:    General: Skin is warm and dry.     Findings: No rash.  Neurological:     Mental Status: He is alert and oriented to person, place, and time.     Cranial Nerves: No cranial nerve deficit.     Motor: No abnormal muscle tone.     Coordination: Coordination normal.     Gait: Gait normal.     Deep Tendon Reflexes: Reflexes are normal and symmetric.  Psychiatric:        Behavior: Behavior normal.        Thought Content: Thought content normal.        Judgment: Judgment normal.    Lab Results  Component Value Date   WBC 6.2 01/06/2021   HGB 13.9 01/06/2021   HCT 41.2 01/06/2021   PLT 235 01/06/2021   GLUCOSE 179 (H) 01/06/2021   CHOL 219 (H) 12/03/2020   TRIG 175.0 (H) 12/03/2020   HDL 47.50 12/03/2020   LDLCALC 137 (H) 12/03/2020   ALT 17 12/03/2020   AST 17 12/03/2020   NA 136 01/06/2021   K 3.9 01/06/2021   CL 99 01/06/2021   CREATININE 0.94 01/06/2021   BUN 21 01/06/2021   CO2 28 01/06/2021   TSH 0.55 12/03/2020   PSA 2.17 03/26/2020   HGBA1C 7.0 (H) 12/03/2020   MICROALBUR 2.3 (H) 03/26/2020    CARDIAC CATHETERIZATION  Result Date: 01/09/2021 Formatting of this result is different from the original.   Mid Cx lesion is 30% stenosed.   1st Diag-2 lesion is 95% stenosed.   1st Diag-1 lesion is 80% stenosed.   2nd Diag lesion is 40% stenosed.   Mid LAD lesion is 40% stenosed.   A stent was successfully placed.   Post intervention, there is a 0% residual stenosis.   Post intervention, there is a 0% residual stenosis.   LV end diastolic pressure is normal. There is evidence for moderate coronary calcification  involving predominantly the LAD and left circumflex vessels. The LAD gives rise to a proximal diagonal 1 vessel with mild calcification that has 80 and 95% stenoses immediately proximal to bifurcating into 2 large branches.  The proximal to mid LAD are more significantly calcified.  The second diagonal vessel has 40% stenosis.  There is  a 40% stenosis immediately after the second diagonal takeoff. The left circumflex vessel is a dominant vessel which has mild to moderate calcification.  There is 30% proximal stenosis. RCA is a small normal nondominant vessel. RECOMMENDATION: DAPT for minimum of 12 months.  With significant coronary calcification recommend changing pravastatin to rosuvastatin 40 mg in this patient with apparent intolerance to atorvastatin in the past.  Recommend increased medical therapy for concomitant CAD.  Optimal blood pressure control with target blood pressure less than 130/80 and ideal blood pressure less than 120/80.  Initial plan following the catheterization is possible same-day discharge later today if patient remains stable; otherwise, DC in a.m with follow-up with Dr. Marlou Porch.    Assessment & Plan:   Problem List Items Addressed This Visit   None    Walker Kehr, MD

## 2021-03-05 NOTE — Addendum Note (Signed)
Addended by: Trenda Moots on: [id number] 09:03 AM   Modules accepted: Orders

## 2021-03-10 ENCOUNTER — Telehealth: Payer: Self-pay | Admitting: Cardiology

## 2021-03-10 NOTE — Telephone Encounter (Signed)
Spoke with wife who is reporting pt has bruising on both of his leg and he has never had this issue before. He has not had any injury or falls.  Wife is highly concerned.   Advised it is not atypical for pt's to have bruising while taking this medication.  He is not having any other signs of bleeding ei nosebleeds, gums or blood in stool.  Advised he was prescribed this medication d/t the (2) stents he had placed on July and will need to take this medication at least until 06/2021 (for 6 months)  Advised I will have Dr Marlou Porch to review and if any changes are to be made, they will be called back.  Wife asked if pt can take a smaller dose.  Advised he is taking the appropriate dosage and to decrease this could put him at risk for stent closure or MI.  Wife states understanding and will await any new orders.

## 2021-03-10 NOTE — Telephone Encounter (Signed)
Pt c/o medication issue:  1. Name of Medication:   ticagrelor (BRILINTA) 90 MG TABS tablet    2. How are you currently taking this medication (dosage and times per day)? Take 1 tablet (90 mg total) by mouth 2 (two) times daily.  3. Are you having a reaction (difficulty breathing--STAT)? yes  4. What is your medication issue? bruises on leg and hasnt fallen

## 2021-03-11 NOTE — Telephone Encounter (Signed)
I believe this was erroneously routed to preop APP box. I see it has been routed to Kelli Churn, Dr. Marlou Porch' nurse. Will remove from preop box.

## 2021-03-11 NOTE — Telephone Encounter (Signed)
Let's check a CBC and BMET  Agree, not uncommon to see bruising with Brilinta.   Important to stay on this medication.  Candee Furbish, MD

## 2021-03-12 ENCOUNTER — Telehealth: Payer: Self-pay | Admitting: *Deleted

## 2021-03-12 DIAGNOSIS — Z79899 Other long term (current) drug therapy: Secondary | ICD-10-CM

## 2021-03-12 DIAGNOSIS — T148XXA Other injury of unspecified body region, initial encounter: Secondary | ICD-10-CM

## 2021-03-12 DIAGNOSIS — Z955 Presence of coronary angioplasty implant and graft: Secondary | ICD-10-CM

## 2021-03-12 NOTE — Telephone Encounter (Signed)
Bonnita Levan, MD Yesterday (6:39 AM)   Let's check a CBC and BMET   Agree, not uncommon to see bruising with Brilinta.    Important to stay on this medication.   Candee Furbish, MD        Note    You routed conversation to You; Jerline Pain, MD 2 days ago   You 2 days ago   Spoke with wife who is reporting pt has bruising on both of his leg and he has never had this issue before. He has not had any injury or falls.  Wife is highly concerned.   Advised it is not atypical for pt's to have bruising while taking this medication.  He is not having any other signs of bleeding ei nosebleeds, gums or blood in stool.  Advised he was prescribed this medication d/t the (2) stents he had placed on July and will need to take this medication at least until 06/2021 (for 6 months)  Advised I will have Dr Marlou Porch to review and if any changes are to be made, they will be called back.  Wife asked if pt can take a smaller dose.  Advised he is taking the appropriate dosage and to decrease this could put him at risk for stent closure or MI.  Wife states understanding and will await any new orders.      Note    Precious Gilding, RN routed conversation to [employer] [facility] 2 days ago   Fidel Levy, RN routed conversation to [employer] 2 days ago   [place], Scheryl Marten routed conversation to [facility] T[facility] 2 days ago   [place], Scheryl Marten 2 days ago   KJ Pt c/o medication issue:   1. Name of Medication:    ticagrelor (BRILINTA) 90 MG TABS tablet      2. How are you currently taking this medication (dosage and times per day)? Take 1 tablet (90 mg total) by mouth 2 (two) times daily.   3. Are you having a reaction (difficulty breathing--STAT)? yes   4. What is your medication issue? bruises on leg and hasnt fallen          Note    Dmitriyeva,Lyudmilia [phone]  [place], Kamira J     The above is from a previous telephone note that was closed before it was completed.  Called and left LM to call  back to discuss need for CBC d/t bruising - per Dr Marlou Porch.

## 2021-03-20 ENCOUNTER — Telehealth: Payer: Self-pay | Admitting: Internal Medicine

## 2021-03-20 NOTE — Telephone Encounter (Signed)
Says patient does not want to participate in their phone call portion of program  Wanted to leave her contact info in case we needed asst w/ patient

## 2021-03-23 ENCOUNTER — Other Ambulatory Visit: Payer: Self-pay

## 2021-03-23 ENCOUNTER — Emergency Department (HOSPITAL_COMMUNITY)
Admission: EM | Admit: 2021-03-23 | Discharge: 2021-03-24 | Disposition: A | Discharge status: Home / Self Care | Payer: HMO | Attending: Emergency Medicine | Admitting: Emergency Medicine

## 2021-03-23 ENCOUNTER — Encounter (HOSPITAL_COMMUNITY): Payer: Self-pay | Admitting: Emergency Medicine

## 2021-03-23 ENCOUNTER — Emergency Department (HOSPITAL_COMMUNITY): Payer: HMO

## 2021-03-23 DIAGNOSIS — E1122 Type 2 diabetes mellitus with diabetic chronic kidney disease: Secondary | ICD-10-CM | POA: Insufficient documentation

## 2021-03-23 DIAGNOSIS — I25119 Atherosclerotic heart disease of native coronary artery with unspecified angina pectoris: Secondary | ICD-10-CM | POA: Insufficient documentation

## 2021-03-23 DIAGNOSIS — R110 Nausea: Secondary | ICD-10-CM | POA: Insufficient documentation

## 2021-03-23 DIAGNOSIS — M47816 Spondylosis without myelopathy or radiculopathy, lumbar region: Secondary | ICD-10-CM | POA: Diagnosis not present

## 2021-03-23 DIAGNOSIS — Z85828 Personal history of other malignant neoplasm of skin: Secondary | ICD-10-CM | POA: Diagnosis not present

## 2021-03-23 DIAGNOSIS — N23 Unspecified renal colic: Secondary | ICD-10-CM

## 2021-03-23 DIAGNOSIS — Z87891 Personal history of nicotine dependence: Secondary | ICD-10-CM | POA: Diagnosis not present

## 2021-03-23 DIAGNOSIS — N189 Chronic kidney disease, unspecified: Secondary | ICD-10-CM | POA: Diagnosis not present

## 2021-03-23 DIAGNOSIS — Z7984 Long term (current) use of oral hypoglycemic drugs: Secondary | ICD-10-CM | POA: Insufficient documentation

## 2021-03-23 DIAGNOSIS — R000 Tachycardia, unspecified: Secondary | ICD-10-CM | POA: Diagnosis not present

## 2021-03-23 DIAGNOSIS — Z79899 Other long term (current) drug therapy: Secondary | ICD-10-CM | POA: Insufficient documentation

## 2021-03-23 DIAGNOSIS — I129 Hypertensive chronic kidney disease with stage 1 through stage 4 chronic kidney disease, or unspecified chronic kidney disease: Secondary | ICD-10-CM | POA: Diagnosis not present

## 2021-03-23 DIAGNOSIS — N132 Hydronephrosis with renal and ureteral calculous obstruction: Secondary | ICD-10-CM | POA: Diagnosis not present

## 2021-03-23 DIAGNOSIS — E039 Hypothyroidism, unspecified: Secondary | ICD-10-CM | POA: Insufficient documentation

## 2021-03-23 DIAGNOSIS — N281 Cyst of kidney, acquired: Secondary | ICD-10-CM | POA: Diagnosis not present

## 2021-03-23 DIAGNOSIS — Z7982 Long term (current) use of aspirin: Secondary | ICD-10-CM | POA: Diagnosis not present

## 2021-03-23 DIAGNOSIS — R109 Unspecified abdominal pain: Secondary | ICD-10-CM | POA: Diagnosis present

## 2021-03-23 DIAGNOSIS — I700 Atherosclerosis of aorta: Secondary | ICD-10-CM | POA: Diagnosis not present

## 2021-03-23 LAB — CBC WITH DIFFERENTIAL/PLATELET
Abs Immature Granulocytes: 0.06 10*3/uL (ref 0.00–0.07)
Basophils Absolute: 0 10*3/uL (ref 0.0–0.1)
Basophils Relative: 0 %
Eosinophils Absolute: 0 10*3/uL (ref 0.0–0.5)
Eosinophils Relative: 0 %
HCT: 40.4 % (ref 39.0–52.0)
Hemoglobin: 13.6 g/dL (ref 13.0–17.0)
Immature Granulocytes: 1 %
Lymphocytes Relative: 8 %
Lymphs Abs: 0.9 10*3/uL (ref 0.7–4.0)
MCH: 30.3 pg (ref 26.0–34.0)
MCHC: 33.7 g/dL (ref 30.0–36.0)
MCV: 90 fL (ref 80.0–100.0)
Monocytes Absolute: 0.5 10*3/uL (ref 0.1–1.0)
Monocytes Relative: 4 %
Neutro Abs: 9.6 10*3/uL — ABNORMAL HIGH (ref 1.7–7.7)
Neutrophils Relative %: 87 %
Platelets: 256 10*3/uL (ref 150–400)
RBC: 4.49 MIL/uL (ref 4.22–5.81)
RDW: 12.4 % (ref 11.5–15.5)
WBC: 11.1 10*3/uL — ABNORMAL HIGH (ref 4.0–10.5)
nRBC: 0 % (ref 0.0–0.2)

## 2021-03-23 LAB — URINALYSIS, ROUTINE W REFLEX MICROSCOPIC
Bacteria, UA: NONE SEEN
Bilirubin Urine: NEGATIVE
Glucose, UA: 50 mg/dL — AB
Ketones, ur: 20 mg/dL — AB
Leukocytes,Ua: NEGATIVE
Nitrite: NEGATIVE
Protein, ur: 30 mg/dL — AB
RBC / HPF: >50 RBC/hpf — ABNORMAL HIGH (ref 0–5)
Specific Gravity, Urine: 1.013 (ref 1.005–1.030)
pH: 6 (ref 5.0–8.0)

## 2021-03-23 LAB — COMPREHENSIVE METABOLIC PANEL
ALT: 31 U/L (ref 0–44)
AST: 23 U/L (ref 15–41)
Albumin: 4.4 g/dL (ref 3.5–5.0)
Alkaline Phosphatase: 54 U/L (ref 38–126)
Anion gap: 13 (ref 5–15)
BUN: 19 mg/dL (ref 8–23)
CO2: 24 mmol/L (ref 22–32)
Calcium: 10 mg/dL (ref 8.9–10.3)
Chloride: 99 mmol/L (ref 98–111)
Creatinine, Ser: 1.4 mg/dL — ABNORMAL HIGH (ref 0.61–1.24)
GFR, Estimated: 54 mL/min — ABNORMAL LOW (ref >60)
Glucose, Bld: 205 mg/dL — ABNORMAL HIGH (ref 70–99)
Potassium: 3.9 mmol/L (ref 3.5–5.1)
Sodium: 136 mmol/L (ref 135–145)
Total Bilirubin: 0.7 mg/dL (ref 0.3–1.2)
Total Protein: 7.5 g/dL (ref 6.5–8.1)

## 2021-03-23 NOTE — ED Provider Notes (Signed)
Emergency Medicine Provider Triage Evaluation Note  ULRICK METHOT , a 70 y.o. male  was evaluated in triage.  Pt complains of left-sided flank pain.  He has a history of kidney stones.  Recent cardiac stents.  Symptoms started today.  It was in his abdomen but now has moved to the left side.  He has had associated vomiting.  No urinary symptoms.  He took 7.5 mg Vicodin and tamsulosin without improvement.  Review of Systems  Positive: Flank pain, vomiting Negative: Fever  Physical Exam  BP (!) 155/133 (BP Location: Right Arm)   Pulse (!) 105   Temp 98.6 F (37 C) (Oral)   Resp 20   SpO2 99%  Gen:   Awake, no distress   Resp:  Normal effort  MSK:   Moves extremities without difficulty  Other:  Abdomen soft and nontender, left flank tenderness  Medical Decision Making  Medically screening exam initiated at 7:31 PM.  Appropriate orders placed.  NATHANEL TALLMAN was informed that the remainder of the evaluation will be completed by another provider, this initial triage assessment does not replace that evaluation, and the importance of remaining in the ED until their evaluation is complete.     Carlisle Cater, PA-C 03/23/21 [id number]    Lucrezia Starch, MD 03/23/21 [id number]

## 2021-03-23 NOTE — Telephone Encounter (Signed)
Was finally able to speak with wife regarding needed lab work.  She will bring the pt Thursday for the lab to be drawn.  Pt had recently had CMP drawn and per Dr Marlou Porch - not necessary to repeat at this time.

## 2021-03-23 NOTE — ED Triage Notes (Signed)
Patient with left flank pain with radiation to abdomen and back.  Patient has history of kidney stones.  Patient has had vomiting and nausea.

## 2021-03-24 MED ORDER — MORPHINE SULFATE (PF) 2 MG/ML IV SOLN
4.0000 mg | Freq: Once | INTRAVENOUS | Status: AC
  Administered 2021-03-24: 4 mg via INTRAVENOUS
  Filled 2021-03-24: qty 2

## 2021-03-24 MED ORDER — OXYCODONE-ACETAMINOPHEN 5-325 MG PO TABS: 1.0000 | ORAL_TABLET | ORAL | 0 refills | Status: DC | PRN

## 2021-03-24 MED ORDER — SODIUM CHLORIDE 0.9 % IV BOLUS
1000.0000 mL | Freq: Once | INTRAVENOUS | Status: AC
  Administered 2021-03-24: 1000 mL via INTRAVENOUS

## 2021-03-24 MED ORDER — ONDANSETRON 4 MG PO TBDP: 4.0000 mg | ORAL_TABLET | Freq: Three times a day (TID) | ORAL | 0 refills | Status: AC | PRN

## 2021-03-24 MED ORDER — TAMSULOSIN HCL 0.4 MG PO CAPS: 0.4000 mg | ORAL_CAPSULE | Freq: Every day | ORAL | 0 refills | Status: DC

## 2021-03-24 MED ORDER — ONDANSETRON HCL 4 MG/2ML IJ SOLN
4.0000 mg | Freq: Once | INTRAMUSCULAR | Status: AC
  Administered 2021-03-24: 4 mg via INTRAVENOUS
  Filled 2021-03-24: qty 2

## 2021-03-24 NOTE — ED Provider Notes (Signed)
[facility] EMERGENCY DEPARTMENT Provider Note   CSN: [id number] Arrival date & time: 03/23/21  1720     History Chief Complaint  Patient presents with   Flank Pain    Ryan Ferguson is a 70 y.o. male.  Pt presented to the ED with left flank pain and nausea.  Pt has a hx of kidney stones and has had to have several stones removed.  He is followed by Dr. Eulogio Ditch.  Pt said he took a 7.5 mg vicodin and tamulosin without improvement in pain.  Unfortunately, pt has been waiting for almost 21 hours prior to coming back to a room.  Pt has a lot of nausea which he does not usually have with his kidney stones.      Past Medical History:  Diagnosis Date   Chronic kidney disease    Colon polyps    Diabetes mellitus without complication (HCC)    does not check cbg daily   Hypertension    Hyperthyroidism    Kidney stones     Patient Active Problem List   Diagnosis Date Noted   Hyperlipidemia 01/09/2021   Abnormal cardiac CT angiography    CAD (coronary artery disease), native coronary artery 12/03/2020   Neoplasm of uncertain behavior of skin 12/03/2020   Atherosclerosis of aorta ([place]) 12/03/2020   Chest pain, atypical 10/02/2020   Fatigue 10/02/2020   H. pylori infection 07/29/2020   GERD (gastroesophageal reflux disease) 05/26/2020   Hypothyroidism due to acquired atrophy of thyroid 03/28/2020   Angina pectoris associated with type 2 diabetes mellitus ([place]) 03/26/2020   Cerumen impaction 03/26/2020   Hypertension    Chronic kidney disease    Hyperthyroidism    Renal calculi    DM (diabetes mellitus), type 2 with complications ([place]) 04/06/5101    Past Surgical History:  Procedure Laterality Date   APPENDECTOMY     age 47   CORONARY STENT INTERVENTION N/A 01/09/2021   Procedure: CORONARY STENT INTERVENTION;  Surgeon: Troy Sine, MD;  Location: [place] [facility];  Service: Cardiovascular;  Laterality: N/A;   CYSTOSCOPY     LEFT HEART CATH AND  CORONARY ANGIOGRAPHY N/A 01/09/2021   Procedure: LEFT HEART CATH AND CORONARY ANGIOGRAPHY;  Surgeon: Troy Sine, MD;  Location: [place] [facility];  Service: Cardiovascular;  Laterality: N/A;   LITHOTRIPSY     4-5 times       Family History  Problem Relation Age of Onset   Heart disease Mother    Thyroid disease Mother    High Cholesterol Father    Hypertension Father    Varicose Veins Father    Pancreatic cancer Father    Kidney disease Father    Lupus Sister    Diabetes Maternal Grandmother    Heart disease Maternal Grandfather    Lung cancer Maternal Grandfather    Heart disease Paternal Grandmother    Colon cancer Neg Hx    Esophageal cancer Neg Hx    Stomach cancer Neg Hx    Liver disease Neg Hx     Social History   Tobacco Use   Smoking status: Former    Packs/day: 1.00    Types: Cigarettes    Quit date: 10/25/2010    Years since quitting: 10.4   Smokeless tobacco: Never  Vaping Use   Vaping Use: Never used  Substance Use Topics   Alcohol use: Yes    Comment: occasional   Drug use: No    Home Medications  Prior to Admission medications   Medication Sig Start Date End Date Taking? Authorizing Provider  ondansetron (ZOFRAN ODT) 4 MG disintegrating tablet Take 1 tablet (4 mg total) by mouth every 8 (eight) hours as needed for nausea or vomiting. 03/24/21  Yes Isla Pence, MD  oxyCODONE-acetaminophen (PERCOCET/ROXICET) 5-325 MG tablet Take 1 tablet by mouth every 4 (four) hours as needed for severe pain. 03/24/21  Yes Isla Pence, MD  tamsulosin (FLOMAX) 0.4 MG CAPS capsule Take 1 capsule (0.4 mg total) by mouth daily. 03/24/21  Yes Isla Pence, MD  amLODipine (NORVASC) 5 MG tablet Take 1 tablet (5 mg total) by mouth daily. 01/09/21   Cheryln Manly, NP  aspirin EC 81 MG tablet Take 1 tablet (81 mg total) by mouth daily. 05/24/19   Jerline Pain, MD  cholecalciferol (VITAMIN D) 1000 UNITS tablet Take 1,000 Units by mouth daily.    [provider]  EUTHYROX 112 MCG tablet TAKE 1 TABLET BY MOUTH IN THE MORNING 10/23/20   Plotnikov, Evie Lacks, MD  famotidine (PEPCID) 40 MG tablet Take 40 mg by mouth daily as needed for heartburn or indigestion. 08/28/20   [provider]  metFORMIN (GLUCOPHAGE) 1000 MG tablet Take 1 tablet by mouth twice daily 10/22/20   Plotnikov, Evie Lacks, MD  nitroGLYCERIN (NITROSTAT) 0.4 MG SL tablet Place 1 tablet (0.4 mg total) under the tongue every 5 (five) minutes as needed for chest pain. 10/02/20   Plotnikov, Evie Lacks, MD  olmesartan-hydrochlorothiazide (BENICAR HCT) 40-12.5 MG tablet Take 1 tablet by mouth daily. 03/26/20   Plotnikov, Evie Lacks, MD  ONE TOUCH ULTRA TEST test strip USE TEST STRIPS TO TEST BLOOD SUGAR ONCE DAILY 03/07/15   [provider]  repaglinide (PRANDIN) 0.5 MG tablet Take 1 tablet (0.5 mg total) by mouth 3 (three) times daily before meals. 10/02/20   Plotnikov, Evie Lacks, MD  rosuvastatin (CRESTOR) 20 MG tablet Take 1 tablet (20 mg total) by mouth daily. 01/22/21 04/22/21  Jerline Pain, MD  ticagrelor (BRILINTA) 90 MG TABS tablet Take 1 tablet (90 mg total) by mouth 2 (two) times daily. 01/09/21   Cheryln Manly, NP    Allergies    Hydrochlorothiazide, Lipitor [atorvastatin], Lisinopril, and Pravachol [pravastatin]  Review of Systems   Review of Systems  Gastrointestinal:  Positive for nausea.  Genitourinary:  Positive for flank pain.  All other systems reviewed and are negative.  Physical Exam Updated Vital Signs BP 139/71 (BP Location: Right Arm)   Pulse 71   Temp 98.5 F (36.9 C) (Oral)   Resp 20   SpO2 100%   Physical Exam Vitals and nursing note reviewed.  Constitutional:      Appearance: Normal appearance.  HENT:     Head: Normocephalic and atraumatic.     Right Ear: External ear normal.     Left Ear: External ear normal.     Nose: Nose normal.     Mouth/Throat:     Mouth: Mucous membranes are dry.  Eyes:     Extraocular Movements:  Extraocular movements intact.     Conjunctiva/sclera: Conjunctivae normal.     Pupils: Pupils are equal, round, and reactive to light.  Cardiovascular:     Rate and Rhythm: Normal rate and regular rhythm.     Pulses: Normal pulses.     Heart sounds: Normal heart sounds.  Pulmonary:     Effort: Pulmonary effort is normal.     Breath sounds: Normal breath sounds.  Abdominal:  General: Abdomen is flat. Bowel sounds are normal.     Palpations: Abdomen is soft.  Musculoskeletal:        General: Normal range of motion.     Cervical back: Normal range of motion and neck supple.  Skin:    General: Skin is warm.     Capillary Refill: Capillary refill takes less than 2 seconds.  Neurological:     General: No focal deficit present.     Mental Status: He is alert and oriented to person, place, and time.  Psychiatric:        Mood and Affect: Mood normal.        Behavior: Behavior normal.    ED Results / Procedures / Treatments   Labs (all labs ordered are listed, but only abnormal results are displayed) Labs Reviewed  URINALYSIS, ROUTINE W REFLEX MICROSCOPIC - Abnormal; Notable for the following components:      Result Value   Glucose, UA 50 (*)    Hgb urine dipstick MODERATE (*)    Ketones, ur 20 (*)    Protein, ur 30 (*)    RBC / HPF >50 (*)    All other components within normal limits  COMPREHENSIVE METABOLIC PANEL - Abnormal; Notable for the following components:   Glucose, Bld 205 (*)    Creatinine, Ser 1.40 (*)    GFR, Estimated 54 (*)    All other components within normal limits  CBC WITH DIFFERENTIAL/PLATELET - Abnormal; Notable for the following components:   WBC 11.1 (*)    Neutro Abs 9.6 (*)    All other components within normal limits    EKG EKG Interpretation  Date/Time:  Monday March 23 2021 19:46:00 EDT Ventricular Rate:  101 PR Interval:  202 QRS Duration: 90 QT Interval:  356 QTC Calculation: 461 R Axis:   65 Text Interpretation: Sinus tachycardia  with Premature atrial complexes Nonspecific ST and T wave abnormality Abnormal ECG Since last tracing rate faster Confirmed by Isla Pence [id number]) on 03/24/2021 2:08:30 PM  Radiology CT RENAL STONE STUDY  Result Date: 03/23/2021 CLINICAL DATA:  Left-sided flank pain, history of renal calculi EXAM: CT ABDOMEN AND PELVIS WITHOUT CONTRAST TECHNIQUE: Multidetector CT imaging of the abdomen and pelvis was performed following the standard protocol without IV contrast. COMPARISON:  06/11/2019 FINDINGS: Lower chest: No acute abnormality. Hepatobiliary: No focal liver abnormality is seen. No gallstones, gallbladder wall thickening, or biliary dilatation. Pancreas: Unremarkable. No pancreatic ductal dilatation or surrounding inflammatory changes. Spleen: Normal in size without focal abnormality. Adrenals/Urinary Tract: Adrenal glands are within normal limits. Kidneys are well visualized bilaterally. Multiple nonobstructing renal calculi are noted. Largest of these lies in the upper pole measuring proximally 4 mm. Left kidney shows significant perinephric stranding as well as hydronephrosis and hydroureter this is secondary to a left mid ureteral stone measuring 6 mm best seen on image number 58 of series 3 and image number 50 of series 6. More distal left ureter is within normal limits. Multiple cysts are seen within the left kidney stable from the prior exam. Additionally small nonobstructing left lower pole renal calculi are seen. Bladder is within normal limits. Stomach/Bowel: Colon is predominately decompressed. No inflammatory changes are seen. The appendix has been surgically removed. Small bowel and stomach are within normal limits. Vascular/Lymphatic: Aortic atherosclerosis. No enlarged abdominal or pelvic lymph nodes. Reproductive: Prostate is unremarkable. Other: No abdominal wall hernia or abnormality. No abdominopelvic ascites. Musculoskeletal: Degenerative changes of lumbar spine are noted. IMPRESSION: 6  mm  left mid ureteral stone causing obstructive change and significant perinephric stranding on the left. Otherwise stable appearing nonobstructing renal calculi bilaterally. Stable appearing left renal cysts. Status post appendectomy. Electronically Signed   By: Inez Catalina M.D.   On: 03/23/2021 20:48    Procedures Procedures   Medications Ordered in ED Medications  sodium chloride 0.9 % bolus 1,000 mL (1,000 mLs Intravenous New Bag/Given 03/24/21 1436)  morphine 2 MG/ML injection 4 mg (4 mg Intravenous Given 03/24/21 1445)  ondansetron (ZOFRAN) injection 4 mg (4 mg Intravenous Given 03/24/21 1438)    ED Course  I have reviewed the triage vital signs and the nursing notes.  Pertinent labs & imaging results that were available during my care of the patient were reviewed by me and considered in my medical decision making (see chart for details).    MDM Rules/Calculators/A&P                          Pain is improved.    Pt d/w Dr. Gloriann Loan. He said for pt to follow up as an outpatient in the next few days.  Pt knows to return if worse.  F/u with Dr. Diona Fanti.  Final Clinical Impression(s) / ED Diagnoses Final diagnoses:  Renal colic on left side    Rx / DC Orders ED Discharge Orders          Ordered    oxyCODONE-acetaminophen (PERCOCET/ROXICET) 5-325 MG tablet  Every 4 hours PRN        03/24/21 1452    ondansetron (ZOFRAN ODT) 4 MG disintegrating tablet  Every 8 hours PRN        03/24/21 1452    tamsulosin (FLOMAX) 0.4 MG CAPS capsule  Daily        03/24/21 1452             Isla Pence, MD 03/24/21 1459

## 2021-03-24 NOTE — ED Notes (Signed)
Patient given snack/water when offered

## 2021-03-25 DIAGNOSIS — N201 Calculus of ureter: Secondary | ICD-10-CM | POA: Diagnosis not present

## 2021-03-26 ENCOUNTER — Other Ambulatory Visit: Payer: HMO

## 2021-03-26 ENCOUNTER — Other Ambulatory Visit: Payer: Self-pay

## 2021-03-26 DIAGNOSIS — Z79899 Other long term (current) drug therapy: Secondary | ICD-10-CM

## 2021-03-26 DIAGNOSIS — T148XXA Other injury of unspecified body region, initial encounter: Secondary | ICD-10-CM

## 2021-03-26 DIAGNOSIS — Z955 Presence of coronary angioplasty implant and graft: Secondary | ICD-10-CM

## 2021-03-26 LAB — CBC
Hematocrit: 37.8 % (ref 37.5–51.0)
Hemoglobin: 12.6 g/dL — ABNORMAL LOW (ref 13.0–17.7)
MCH: 30.1 pg (ref 26.6–33.0)
MCHC: 33.3 g/dL (ref 31.5–35.7)
MCV: 90 fL (ref 79–97)
Platelets: 231 10*3/uL (ref 150–450)
RBC: 4.19 x10E6/uL (ref 4.14–5.80)
RDW: 12.3 % (ref 11.6–15.4)
WBC: 8.2 10*3/uL (ref 3.4–10.8)

## 2021-04-06 ENCOUNTER — Other Ambulatory Visit: Payer: Self-pay | Admitting: Internal Medicine

## 2021-04-08 ENCOUNTER — Other Ambulatory Visit: Payer: Self-pay | Admitting: *Deleted

## 2021-04-08 MED ORDER — REPAGLINIDE 0.5 MG PO TABS: 0.5000 mg | ORAL_TABLET | Freq: Three times a day (TID) | ORAL | 3 refills | Status: DC

## 2021-04-13 DIAGNOSIS — N202 Calculus of kidney with calculus of ureter: Secondary | ICD-10-CM | POA: Diagnosis not present

## 2021-04-21 ENCOUNTER — Other Ambulatory Visit: Payer: Self-pay | Admitting: Internal Medicine

## 2021-04-24 ENCOUNTER — Other Ambulatory Visit: Payer: HMO | Admitting: *Deleted

## 2021-04-24 ENCOUNTER — Other Ambulatory Visit: Payer: Self-pay

## 2021-04-24 DIAGNOSIS — E785 Hyperlipidemia, unspecified: Secondary | ICD-10-CM

## 2021-04-24 DIAGNOSIS — Z79899 Other long term (current) drug therapy: Secondary | ICD-10-CM

## 2021-04-24 LAB — LIPID PANEL
Chol/HDL Ratio: 3 ratio (ref 0.0–5.0)
Cholesterol, Total: 126 mg/dL (ref 100–199)
HDL: 42 mg/dL (ref >39)
LDL Chol Calc (NIH): 40 mg/dL (ref 0–99)
Triglycerides: 291 mg/dL — ABNORMAL HIGH (ref 0–149)
VLDL Cholesterol Cal: 44 mg/dL — ABNORMAL HIGH (ref 5–40)

## 2021-04-24 LAB — ALT: ALT: 19 IU/L (ref 0–44)

## 2021-04-26 NOTE — Progress Notes (Signed)
Office Visit    Patient Name: Ryan Ferguson Date of Encounter: 04/27/2021  PCP:  Cassandria Anger, MD   [place]  Cardiologist:  Candee Furbish, MD  Advanced Practice Provider:  No care team member to display Electrophysiologist:  None     Chief Complaint    Ryan Ferguson is a 70 y.o. male with a hx of CAD s/p DESx2 to diagonal 12/2020, HLD, DM2, CKD presents today for follow up of CAD   Past Medical History    Past Medical History:  Diagnosis Date   Chronic kidney disease    Colon polyps    Diabetes mellitus without complication ([place])    does not check cbg daily   Hypertension    Hyperthyroidism    Kidney stones    Past Surgical History:  Procedure Laterality Date   APPENDECTOMY     age 37   CORONARY STENT INTERVENTION N/A 01/09/2021   Procedure: CORONARY STENT INTERVENTION;  Surgeon: Troy Sine, MD;  Location: [place] [facility];  Service: Cardiovascular;  Laterality: N/A;   CYSTOSCOPY     LEFT HEART CATH AND CORONARY ANGIOGRAPHY N/A 01/09/2021   Procedure: LEFT HEART CATH AND CORONARY ANGIOGRAPHY;  Surgeon: Troy Sine, MD;  Location: [place] [facility];  Service: Cardiovascular;  Laterality: N/A;   LITHOTRIPSY     4-5 times    Allergies  Allergies  Allergen Reactions   Hydrochlorothiazide Other (See Comments)    Unknown reaction   Lipitor [Atorvastatin]     myalgias   Lisinopril Cough   Pravachol [Pravastatin] Other (See Comments)    Muscle pain (currently tolerating 20 mg vs. 40 mg)    History of Present Illness    Ryan Ferguson is a 70 y.o. male with a hx of  CAD s/p DESx2 to diagonal 12/2020, HLD, DM2, CKD last seen 01/22/21.  He underwent cardiac CTA with coronary calcium score 1492 leading to FFR showing diagonal stenosis of significance. Underwent cardiac cath July 2022 at which time DESx2 were placed to large diagonal vessel. LVEF was normal at 55%. Otherwise nonobstructive disease in LAD and LCx. He was  recommended for DAPt for at least 12 months.   He was last seen 01/22/21. He noted palpitations and had self discontinued his antihypertensive. He was recommended to resume. A monitor was placed and he was referred to cardiac rehab as deconditioning thought to be contributory. His lipid management was intensified to Crestor 20mg  QD. ZIO monitor showed predominantly NSR average heart rate 74 bpm with rare short runs of paroxysmal atrial tachycardia and no significant arrhythmias.  He presents today for follow up. Reports no shortness of breath nor dyspnea on exertion. Reports no chest pain, pressure, or tightness. No edema, orthopnea, PND. Reports intermittent palpitations when laying on left side. Notes bad taste in his mouth - encouraged to discuss with primary care or dentist. Walking and lifting weight for exercise.   EKGs/Labs/Other Studies Reviewed:   The following studies were reviewed today:  ZIO 01/2021 Sinus rhythm with average heart rate 74 bpm with normal increase with activity Rare short runs of parox atrial tachycardia - benign Rare PAC's and PVC's No atrial fibrillation, no pauses, no ventricular tachycardia Overal reassuring monitor   Cardiac catheterization 01/09/2021:   Diagnostic Dominance: Left     Intervention     CT coronary FFR on 12/26/2020 prior to catheterization: 1. CT FFR demonstrates significant discrete stenosis of the mid D1 branch which  is flow limiting. There is tapering flow limitation at the distal LAD, probably not suggestive of a discrete stenosis.   2. Consider cardiac catheterization or optimization of medical  EKG:  No EKG today  Recent Labs: 03/05/2021: TSH 0.10 03/23/2021: BUN 19; Creatinine, Ser 1.40; Potassium 3.9; Sodium 136 03/26/2021: Hemoglobin 12.6; Platelets 231 04/24/2021: ALT 19  Recent Lipid Panel    Component Value Date/Time   CHOL 126 04/24/2021 0820   TRIG 291 (H) 04/24/2021 0820   HDL 42 04/24/2021 0820   CHOLHDL 3.0  04/24/2021 0820   CHOLHDL 5 12/03/2020 0856   VLDL 35.0 12/03/2020 0856   LDLCALC 40 04/24/2021 0820    Home Medications   Current Meds  Medication Sig   amLODipine (NORVASC) 5 MG tablet Take 1 tablet (5 mg total) by mouth daily.   aspirin EC 81 MG tablet Take 1 tablet (81 mg total) by mouth daily.   cholecalciferol (VITAMIN D) 1000 UNITS tablet Take 1,000 Units by mouth daily.   famotidine (PEPCID) 40 MG tablet Take 40 mg by mouth daily as needed for heartburn or indigestion.   levothyroxine (SYNTHROID) 112 MCG tablet TAKE 1 TABLET BY MOUTH IN THE MORNING   metFORMIN (GLUCOPHAGE) 1000 MG tablet Take 1 tablet by mouth twice daily   nitroGLYCERIN (NITROSTAT) 0.4 MG SL tablet Place 1 tablet (0.4 mg total) under the tongue every 5 (five) minutes as needed for chest pain.   olmesartan-hydrochlorothiazide (BENICAR HCT) 40-12.5 MG tablet Take 1 tablet by mouth once daily   ONE TOUCH ULTRA TEST test strip USE TEST STRIPS TO TEST BLOOD SUGAR ONCE DAILY   repaglinide (PRANDIN) 0.5 MG tablet Take 1 tablet (0.5 mg total) by mouth 3 (three) times daily before meals.   tamsulosin (FLOMAX) 0.4 MG CAPS capsule Take 1 capsule (0.4 mg total) by mouth daily.   ticagrelor (BRILINTA) 90 MG TABS tablet Take 1 tablet (90 mg total) by mouth 2 (two) times daily.   [DISCONTINUED] atorvastatin (LIPITOR) 40 MG tablet one-half tablet   [DISCONTINUED] lisinopril (ZESTRIL) 40 MG tablet Take 1 tablet by mouth daily.     Review of Systems      All other systems reviewed and are otherwise negative except as noted above.  Physical Exam    VS:  BP 130/80   Pulse 64   Ht 5\' 6"  (1.676 m)   Wt 147 lb 3.2 oz (66.8 kg)   SpO2 98%   BMI 23.76 kg/m  , BMI Body mass index is 23.76 kg/m.  Wt Readings from Last 3 Encounters:  04/27/21 147 lb 3.2 oz (66.8 kg)  03/05/21 150 lb 9.6 oz (68.3 kg)  01/22/21 150 lb (68 kg)     GEN: Well nourished, well developed, in no acute distress. HEENT: normal. Neck: Supple, no  JVD, carotid bruits, or masses. Cardiac: RRR, no murmurs, rubs, or gallops. No clubbing, cyanosis, edema.  Radials/PT 2+ and equal bilaterally.  Respiratory:  Respirations regular and unlabored, clear to auscultation bilaterally. GI: Soft, nontender, nondistended. MS: No deformity or atrophy. Skin: Warm and dry, no rash. Neuro:  Strength and sensation are intact. Psych: Normal affect.  Assessment & Plan    CAD s/p DES x 2 to LAD 12/2020 - Stable with no anginal symptoms. No indication for ischemic evaluation.  GDMT includes aspirin, brilinta, rosuvastatin, PRN nitroglycerin. No BB due to baseline bradycardia.   HLD, LDL goal <70 - 04/24/2021 LDL 40. Continue Crestor 20mg  QD. Of note, taking Atorvastatin as well and educated to  discontinue.  HTN - BP well controlled. Of note, taking Benicar and Lisinopril. Will DC Lisinopril. Continue Amlodipine 5mg  QD. Home monitoring encouraged.   Hypothyroidism - Continue to follow with PCP.   CKD - Careful titration of diuretic and antihypertensive.    DM2 - 03/05/21 A1c 7.5. Continue to follow with PCP. If additional agent needed consider SGLT2i or GLP1 for cardioprotective benefit.   Palpitations - ZIO 01/2021 with NSR and rare short runs paroxysmal atrial tachycardia. Notes palpitations only when laying on left side. Recommend adequate hydration, avoiding caffeine, managing stress well.  Disposition: Follow up in 3-4 month(s) with Dr. Marlou Porch or APP.  Signed, Loel Dubonnet, NP 04/27/2021, 8:46 AM [place]

## 2021-04-27 ENCOUNTER — Encounter (HOSPITAL_BASED_OUTPATIENT_CLINIC_OR_DEPARTMENT_OTHER): Payer: Self-pay | Admitting: Family

## 2021-04-27 ENCOUNTER — Ambulatory Visit (HOSPITAL_BASED_OUTPATIENT_CLINIC_OR_DEPARTMENT_OTHER): Payer: HMO | Admitting: Family

## 2021-04-27 ENCOUNTER — Other Ambulatory Visit: Payer: Self-pay

## 2021-04-27 VITALS — BP 130/80 | HR 64 | Ht 66.0 in | Wt 147.2 lb

## 2021-04-27 DIAGNOSIS — I25118 Atherosclerotic heart disease of native coronary artery with other forms of angina pectoris: Secondary | ICD-10-CM | POA: Diagnosis not present

## 2021-04-27 DIAGNOSIS — I10 Essential (primary) hypertension: Secondary | ICD-10-CM

## 2021-04-27 DIAGNOSIS — E785 Hyperlipidemia, unspecified: Secondary | ICD-10-CM | POA: Diagnosis not present

## 2021-04-27 NOTE — Patient Instructions (Signed)
Medication Instructions:  Your physician has recommended you make the following change in your medication:    STOP Lisinopril   STOP Atorvastatin (Lipitor)  *If you need a refill on your cardiac medications before your next appointment, please call your pharmacy*   Lab Work: None ordered today.   Testing/Procedures: None ordered today.   Follow-Up: At [facility], you and your health needs are our priority.  As part of our continuing mission to provide you with exceptional heart care, we have created designated Provider Care Teams.  These Care Teams include your primary Cardiologist (physician) and Advanced Practice Providers (APPs -  Physician Assistants and Nurse Practitioners) who all work together to provide you with the care you need, when you need it.  We recommend signing up for the patient portal called "MyChart".  Sign up information is provided on this After Visit Summary.  MyChart is used to connect with patients for Virtual Visits (Telemedicine).  Patients are able to view lab/test results, encounter notes, upcoming appointments, etc.  Non-urgent messages can be sent to your provider as well.   To learn more about what you can do with MyChart, go to [email or web address].    Your next appointment:   3-4 month(s)  The format for your next appointment:   In Person  Provider:   Candee Furbish, MD   Other Instructions  Heart Healthy Diet Recommendations: A low-salt diet is recommended. Meats should be grilled, baked, or boiled. Avoid fried foods. Focus on lean protein sources like fish or chicken with vegetables and fruits. The American Heart Association is a Microbiologist!  American Heart Association Diet and Lifeystyle Recommendations    Exercise recommendations: The American Heart Association recommends 150 minutes of moderate intensity exercise weekly. Try 30 minutes of moderate intensity exercise 4-5 times per week. This could include walking, jogging, or  swimming.

## 2021-04-29 ENCOUNTER — Encounter: Payer: Self-pay | Admitting: *Deleted

## 2021-05-22 DIAGNOSIS — R350 Frequency of micturition: Secondary | ICD-10-CM | POA: Diagnosis not present

## 2021-05-22 DIAGNOSIS — N200 Calculus of kidney: Secondary | ICD-10-CM | POA: Diagnosis not present

## 2021-05-22 DIAGNOSIS — R351 Nocturia: Secondary | ICD-10-CM | POA: Diagnosis not present

## 2021-05-22 DIAGNOSIS — N401 Enlarged prostate with lower urinary tract symptoms: Secondary | ICD-10-CM | POA: Diagnosis not present

## 2021-06-04 ENCOUNTER — Other Ambulatory Visit: Payer: Self-pay

## 2021-06-04 ENCOUNTER — Encounter: Payer: Self-pay | Admitting: Internal Medicine

## 2021-06-04 ENCOUNTER — Ambulatory Visit (INDEPENDENT_AMBULATORY_CARE_PROVIDER_SITE_OTHER): Payer: HMO | Admitting: Internal Medicine

## 2021-06-04 VITALS — BP 142/72 | HR 61 | Temp 97.8°F | Ht 66.0 in | Wt 145.8 lb

## 2021-06-04 DIAGNOSIS — I25118 Atherosclerotic heart disease of native coronary artery with other forms of angina pectoris: Secondary | ICD-10-CM | POA: Diagnosis not present

## 2021-06-04 DIAGNOSIS — N182 Chronic kidney disease, stage 2 (mild): Secondary | ICD-10-CM

## 2021-06-04 DIAGNOSIS — E782 Mixed hyperlipidemia: Secondary | ICD-10-CM | POA: Diagnosis not present

## 2021-06-04 DIAGNOSIS — E118 Type 2 diabetes mellitus with unspecified complications: Secondary | ICD-10-CM | POA: Diagnosis not present

## 2021-06-04 DIAGNOSIS — N200 Calculus of kidney: Secondary | ICD-10-CM

## 2021-06-04 MED ORDER — RYBELSUS 3 MG PO TABS: 3.0000 mg | ORAL_TABLET | Freq: Every day | ORAL | 3 refills | Status: DC

## 2021-06-04 NOTE — Progress Notes (Signed)
Subjective:  Patient ID: Ryan Ferguson, male    DOB: July 22, 1950  Age: 70 y.o. MRN: [id number]  CC: Follow-up (3 month f/u)   HPI Ryan Ferguson presents for CAD, dyslipidemia, HTN, DM   Pt loves sweets and tends to overeat. He put wt on  Outpatient Medications Prior to Visit  Medication Sig Dispense Refill   amLODipine (NORVASC) 5 MG tablet Take 1 tablet (5 mg total) by mouth daily. 90 tablet 1   aspirin EC 81 MG tablet Take 1 tablet (81 mg total) by mouth daily. 90 tablet 3   cholecalciferol (VITAMIN D) 1000 UNITS tablet Take 1,000 Units by mouth daily.     famotidine (PEPCID) 40 MG tablet Take 40 mg by mouth daily as needed for heartburn or indigestion.     levothyroxine (SYNTHROID) 112 MCG tablet TAKE 1 TABLET BY MOUTH IN THE MORNING 90 tablet 3   metFORMIN (GLUCOPHAGE) 1000 MG tablet Take 1 tablet by mouth twice daily 180 tablet 3   nitroGLYCERIN (NITROSTAT) 0.4 MG SL tablet Place 1 tablet (0.4 mg total) under the tongue every 5 (five) minutes as needed for chest pain. 50 tablet 3   olmesartan-hydrochlorothiazide (BENICAR HCT) 40-12.5 MG tablet Take 1 tablet by mouth once daily 90 tablet 3   ondansetron (ZOFRAN ODT) 4 MG disintegrating tablet Take 1 tablet (4 mg total) by mouth every 8 (eight) hours as needed for nausea or vomiting. 20 tablet 0   ONE TOUCH ULTRA TEST test strip USE TEST STRIPS TO TEST BLOOD SUGAR ONCE DAILY  4   oxyCODONE-acetaminophen (PERCOCET/ROXICET) 5-325 MG tablet Take 1 tablet by mouth every 4 (four) hours as needed for severe pain. 20 tablet 0   repaglinide (PRANDIN) 0.5 MG tablet Take 1 tablet (0.5 mg total) by mouth 3 (three) times daily before meals. 270 tablet 3   tamsulosin (FLOMAX) 0.4 MG CAPS capsule Take 1 capsule (0.4 mg total) by mouth daily. 30 capsule 0   ticagrelor (BRILINTA) 90 MG TABS tablet Take 1 tablet (90 mg total) by mouth 2 (two) times daily. 180 tablet 1   rosuvastatin (CRESTOR) 20 MG tablet Take 1 tablet (20 mg total) by mouth  daily. 90 tablet 3   No facility-administered medications prior to visit.    ROS: Review of Systems  Constitutional:  Positive for unexpected weight change. Negative for appetite change and fatigue.  HENT:  Negative for congestion, nosebleeds, sneezing, sore throat and trouble swallowing.   Eyes:  Negative for itching and visual disturbance.  Respiratory:  Negative for cough.   Cardiovascular:  Negative for chest pain, palpitations and leg swelling.  Gastrointestinal:  Negative for abdominal distention, blood in stool, diarrhea and nausea.  Genitourinary:  Negative for frequency and hematuria.  Musculoskeletal:  Negative for back pain, gait problem, joint swelling and neck pain.  Skin:  Negative for rash.  Neurological:  Negative for dizziness, tremors, speech difficulty and weakness.  Psychiatric/Behavioral:  Negative for agitation, dysphoric mood and sleep disturbance. The patient is not nervous/anxious.    Objective:  BP (!) 142/72 (BP Location: Left Arm)    Pulse 61    Temp 97.8 F (36.6 C) (Oral)    Ht 5\' 6"  (1.676 m)    Wt 145 lb 12.8 oz (66.1 kg)    SpO2 97%    BMI 23.53 kg/m   BP Readings from Last 3 Encounters:  06/04/21 (!) 142/72  04/27/21 130/80  03/24/21 129/60    Wt Readings from Last 3 Encounters:  06/04/21 145 lb 12.8 oz (66.1 kg)  04/27/21 147 lb 3.2 oz (66.8 kg)  03/05/21 150 lb 9.6 oz (68.3 kg)    Physical Exam Constitutional:      General: He is not in acute distress.    Appearance: He is well-developed.     Comments: NAD  Eyes:     Conjunctiva/sclera: Conjunctivae normal.     Pupils: Pupils are equal, round, and reactive to light.  Neck:     Thyroid: No thyromegaly.     Vascular: No JVD.  Cardiovascular:     Rate and Rhythm: Normal rate and regular rhythm.     Heart sounds: Normal heart sounds. No murmur heard.   No friction rub. No gallop.  Pulmonary:     Effort: Pulmonary effort is normal. No respiratory distress.     Breath sounds: Normal  breath sounds. No wheezing or rales.  Chest:     Chest wall: No tenderness.  Abdominal:     General: Bowel sounds are normal. There is no distension.     Palpations: Abdomen is soft. There is no mass.     Tenderness: There is no abdominal tenderness. There is no guarding or rebound.  Musculoskeletal:        General: No tenderness. Normal range of motion.     Cervical back: Normal range of motion.  Lymphadenopathy:     Cervical: No cervical adenopathy.  Skin:    General: Skin is warm and dry.     Findings: No rash.  Neurological:     Mental Status: He is alert and oriented to person, place, and time.     Cranial Nerves: No cranial nerve deficit.     Motor: No abnormal muscle tone.     Coordination: Coordination normal.     Gait: Gait normal.     Deep Tendon Reflexes: Reflexes are normal and symmetric.  Psychiatric:        Behavior: Behavior normal.        Thought Content: Thought content normal.        Judgment: Judgment normal.    Lab Results  Component Value Date   WBC 8.2 03/26/2021   HGB 12.6 (L) 03/26/2021   HCT 37.8 03/26/2021   PLT 231 03/26/2021   GLUCOSE 205 (H) 03/23/2021   CHOL 126 04/24/2021   TRIG 291 (H) 04/24/2021   HDL 42 04/24/2021   LDLCALC 40 04/24/2021   ALT 19 04/24/2021   AST 23 03/23/2021   NA 136 03/23/2021   K 3.9 03/23/2021   CL 99 03/23/2021   CREATININE 1.40 (H) 03/23/2021   BUN 19 03/23/2021   CO2 24 03/23/2021   TSH 0.10 (L) 03/05/2021   PSA 2.17 03/26/2020   HGBA1C 7.5 (H) 03/05/2021   MICROALBUR 2.3 (H) 03/26/2020    CT RENAL STONE STUDY  Result Date: 03/23/2021 CLINICAL DATA:  Left-sided flank pain, history of renal calculi EXAM: CT ABDOMEN AND PELVIS WITHOUT CONTRAST TECHNIQUE: Multidetector CT imaging of the abdomen and pelvis was performed following the standard protocol without IV contrast. COMPARISON:  06/11/2019 FINDINGS: Lower chest: No acute abnormality. Hepatobiliary: No focal liver abnormality is seen. No gallstones,  gallbladder wall thickening, or biliary dilatation. Pancreas: Unremarkable. No pancreatic ductal dilatation or surrounding inflammatory changes. Spleen: Normal in size without focal abnormality. Adrenals/Urinary Tract: Adrenal glands are within normal limits. Kidneys are well visualized bilaterally. Multiple nonobstructing renal calculi are noted. Largest of these lies in the upper pole measuring proximally 4 mm. Left kidney shows  significant perinephric stranding as well as hydronephrosis and hydroureter this is secondary to a left mid ureteral stone measuring 6 mm best seen on image number 58 of series 3 and image number 50 of series 6. More distal left ureter is within normal limits. Multiple cysts are seen within the left kidney stable from the prior exam. Additionally small nonobstructing left lower pole renal calculi are seen. Bladder is within normal limits. Stomach/Bowel: Colon is predominately decompressed. No inflammatory changes are seen. The appendix has been surgically removed. Small bowel and stomach are within normal limits. Vascular/Lymphatic: Aortic atherosclerosis. No enlarged abdominal or pelvic lymph nodes. Reproductive: Prostate is unremarkable. Other: No abdominal wall hernia or abnormality. No abdominopelvic ascites. Musculoskeletal: Degenerative changes of lumbar spine are noted. IMPRESSION: 6 mm left mid ureteral stone causing obstructive change and significant perinephric stranding on the left. Otherwise stable appearing nonobstructing renal calculi bilaterally. Stable appearing left renal cysts. Status post appendectomy. Electronically Signed   By: Inez Catalina M.D.   On: 03/23/2021 20:48    Assessment & Plan:   Problem List Items Addressed This Visit     CAD (coronary artery disease), native coronary artery    No angina      CRI (chronic renal insufficiency), stage 2 (mild)    GFR 68      DM (diabetes mellitus), type 2 with complications ([place])     Discussed.  Pt loves  sweets and tends to overeat. We can try Rybelsus       Relevant Medications   Semaglutide (RYBELSUS) 3 MG TABS   Other Relevant Orders   TSH   Comprehensive metabolic panel   Lipid panel   Hemoglobin A1c   Hyperlipidemia - Primary   Relevant Orders   Lipid panel   Nephrolithiasis    F/u w/Dr Dahlstedt   L colic - passed a stone         Meds ordered this encounter  Medications   Semaglutide (RYBELSUS) 3 MG TABS    Sig: Take 3 mg by mouth daily.    Dispense:  30 tablet    Refill:  3      Follow-up: No follow-ups on file.  Walker Kehr, MD

## 2021-06-04 NOTE — Assessment & Plan Note (Signed)
GFR 68

## 2021-06-04 NOTE — Assessment & Plan Note (Addendum)
Discussed.  Pt loves sweets and tends to overeat. We can try Rybelsus

## 2021-06-04 NOTE — Assessment & Plan Note (Signed)
F/u w/Dr Dahlstedt   L colic - passed a stone

## 2021-06-04 NOTE — Assessment & Plan Note (Signed)
No angina 

## 2021-06-04 NOTE — Patient Instructions (Signed)
GFR 63

## 2021-06-08 ENCOUNTER — Telehealth: Payer: Self-pay | Admitting: Internal Medicine

## 2021-06-08 NOTE — Telephone Encounter (Signed)
N/A unable to leave a message for patient to call me back at [phone] to schedule Medicare Annual Wellness Visit   Last AWV  05/07/20  Please schedule at anytime with LB Reese if patient calls the office back.    40 Minutes appointment   Any questions, please call me at [phone]

## 2021-07-29 ENCOUNTER — Telehealth: Payer: Self-pay | Admitting: Cardiology

## 2021-07-29 NOTE — Telephone Encounter (Signed)
Patient's wife called stating that her husband was on ticagrelor (BRILINTA) 90 MG TABS tablet, doctor advised him to take for 6 mos.  The 6 mos are up, she wants to make sure he doesn't need to take it anymore.

## 2021-07-31 MED ORDER — CLOPIDOGREL BISULFATE 75 MG PO TABS: 75.0000 mg | ORAL_TABLET | Freq: Every day | ORAL | 2 refills | Status: DC

## 2021-07-31 NOTE — Telephone Encounter (Signed)
Jerline Pain, MD  Michaelyn Barter, RN Yesterday (9:51 AM)   Faythe Ghee to change Brilinta to Plavix 75 mg once a day.  Would continue with aspirin 81 mg as well.  After another 6 months, can transition to aspirin alone.  Candee Furbish, MD     Spoke with the pts wife (on [place]) and son about recommendations as mentioned above by Dr. Marlou Porch.  Both parties aware that the pt can stop his Brilinta and now transition to start taking plavix 75 mg po daily and continue taking his ASA 81 mg po daily.  Informed both parties that per Dr. Marlou Porch, after another 6 months, he can then transition to ASA 81 mg po daily thereafter.   Confirmed the pharmacy of choice with the pts wife and son. Both parties verbalized understanding and agrees with this plan.

## 2021-08-04 DIAGNOSIS — H11132 Conjunctival pigmentations, left eye: Secondary | ICD-10-CM | POA: Diagnosis not present

## 2021-08-04 DIAGNOSIS — H524 Presbyopia: Secondary | ICD-10-CM | POA: Diagnosis not present

## 2021-08-04 DIAGNOSIS — H2513 Age-related nuclear cataract, bilateral: Secondary | ICD-10-CM | POA: Diagnosis not present

## 2021-08-04 DIAGNOSIS — E119 Type 2 diabetes mellitus without complications: Secondary | ICD-10-CM | POA: Diagnosis not present

## 2021-08-07 ENCOUNTER — Ambulatory Visit (INDEPENDENT_AMBULATORY_CARE_PROVIDER_SITE_OTHER): Payer: HMO | Admitting: Internal Medicine

## 2021-08-07 ENCOUNTER — Other Ambulatory Visit: Payer: Self-pay

## 2021-08-07 ENCOUNTER — Encounter: Payer: Self-pay | Admitting: Internal Medicine

## 2021-08-07 DIAGNOSIS — E118 Type 2 diabetes mellitus with unspecified complications: Secondary | ICD-10-CM | POA: Diagnosis not present

## 2021-08-07 DIAGNOSIS — E0590 Thyrotoxicosis, unspecified without thyrotoxic crisis or storm: Secondary | ICD-10-CM | POA: Diagnosis not present

## 2021-08-07 DIAGNOSIS — E782 Mixed hyperlipidemia: Secondary | ICD-10-CM | POA: Diagnosis not present

## 2021-08-07 DIAGNOSIS — N182 Chronic kidney disease, stage 2 (mild): Secondary | ICD-10-CM

## 2021-08-07 DIAGNOSIS — I25118 Atherosclerotic heart disease of native coronary artery with other forms of angina pectoris: Secondary | ICD-10-CM | POA: Diagnosis not present

## 2021-08-07 LAB — COMPREHENSIVE METABOLIC PANEL
ALT: 21 U/L (ref 0–53)
AST: 15 U/L (ref 0–37)
Albumin: 4.6 g/dL (ref 3.5–5.2)
Alkaline Phosphatase: 48 U/L (ref 39–117)
BUN: 20 mg/dL (ref 6–23)
CO2: 32 mEq/L (ref 19–32)
Calcium: 10.2 mg/dL (ref 8.4–10.5)
Chloride: 99 mEq/L (ref 96–112)
Creatinine, Ser: 1.15 mg/dL (ref 0.40–1.50)
GFR: 64.27 mL/min (ref >60.00)
Glucose, Bld: 220 mg/dL — ABNORMAL HIGH (ref 70–99)
Potassium: 4 mEq/L (ref 3.5–5.1)
Sodium: 136 mEq/L (ref 135–145)
Total Bilirubin: 0.4 mg/dL (ref 0.2–1.2)
Total Protein: 7 g/dL (ref 6.0–8.3)

## 2021-08-07 LAB — LIPID PANEL
Cholesterol: 143 mg/dL (ref 0–200)
HDL: 54.3 mg/dL (ref >39.00)
LDL Cholesterol: 66 mg/dL (ref 0–99)
NonHDL: 88.93
Total CHOL/HDL Ratio: 3
Triglycerides: 117 mg/dL (ref 0.0–149.0)
VLDL: 23.4 mg/dL (ref 0.0–40.0)

## 2021-08-07 LAB — TSH: TSH: 0.55 u[IU]/mL (ref 0.35–5.50)

## 2021-08-07 LAB — HEMOGLOBIN A1C: Hgb A1c MFr Bld: 7.4 % — ABNORMAL HIGH (ref 4.6–6.5)

## 2021-08-07 MED ORDER — RYBELSUS 3 MG PO TABS: 3.0000 mg | ORAL_TABLET | Freq: Every day | ORAL | 3 refills | Status: DC

## 2021-08-07 NOTE — Assessment & Plan Note (Signed)
Monitor GFR 

## 2021-08-07 NOTE — Assessment & Plan Note (Signed)
Monitoring TSH

## 2021-08-07 NOTE — Assessment & Plan Note (Signed)
The pt has not started Rubelsus and Repaglinide yet Pt states BS drops too low on Repaglinide We will  try Rybelsus

## 2021-08-07 NOTE — Progress Notes (Signed)
Subjective:  Patient ID: Ryan Ferguson, male    DOB: October 09, 1950  Age: 71 y.o. MRN: [id number]  CC: No chief complaint on file.   HPI Ryan Ferguson presents for HTN, CAD, DM The pt has not started Rubelsus and Repaglinide yet Pt states BS drops too low on Repaglinide  Outpatient Medications Prior to Visit  Medication Sig Dispense Refill   amLODipine (NORVASC) 5 MG tablet Take 1 tablet (5 mg total) by mouth daily. 90 tablet 1   aspirin EC 81 MG tablet Take 1 tablet (81 mg total) by mouth daily. 90 tablet 3   cholecalciferol (VITAMIN D) 1000 UNITS tablet Take 1,000 Units by mouth daily.     clopidogrel (PLAVIX) 75 MG tablet Take 1 tablet (75 mg total) by mouth daily. 90 tablet 2   famotidine (PEPCID) 40 MG tablet Take 40 mg by mouth daily as needed for heartburn or indigestion.     levothyroxine (SYNTHROID) 112 MCG tablet TAKE 1 TABLET BY MOUTH IN THE MORNING 90 tablet 3   metFORMIN (GLUCOPHAGE) 1000 MG tablet Take 1 tablet by mouth twice daily 180 tablet 3   nitroGLYCERIN (NITROSTAT) 0.4 MG SL tablet Place 1 tablet (0.4 mg total) under the tongue every 5 (five) minutes as needed for chest pain. 50 tablet 3   olmesartan-hydrochlorothiazide (BENICAR HCT) 40-12.5 MG tablet Take 1 tablet by mouth once daily 90 tablet 3   ondansetron (ZOFRAN ODT) 4 MG disintegrating tablet Take 1 tablet (4 mg total) by mouth every 8 (eight) hours as needed for nausea or vomiting. 20 tablet 0   ONE TOUCH ULTRA TEST test strip USE TEST STRIPS TO TEST BLOOD SUGAR ONCE DAILY  4   oxyCODONE-acetaminophen (PERCOCET/ROXICET) 5-325 MG tablet Take 1 tablet by mouth every 4 (four) hours as needed for severe pain. 20 tablet 0   tamsulosin (FLOMAX) 0.4 MG CAPS capsule Take 1 capsule (0.4 mg total) by mouth daily. 30 capsule 0   repaglinide (PRANDIN) 0.5 MG tablet Take 1 tablet (0.5 mg total) by mouth 3 (three) times daily before meals. 270 tablet 3   Semaglutide (RYBELSUS) 3 MG TABS Take 3 mg by mouth daily. 30  tablet 3   rosuvastatin (CRESTOR) 20 MG tablet Take 1 tablet (20 mg total) by mouth daily. 90 tablet 3   No facility-administered medications prior to visit.    ROS: Review of Systems  Objective:  BP 140/70 (BP Location: Left Arm, Patient Position: Sitting, Cuff Size: Large)    Pulse (!) 58    Temp 98.7 F (37.1 C) (Oral)    Ht 5\' 6"  (1.676 m)    Wt 150 lb (68 kg)    SpO2 96%    BMI 24.21 kg/m   BP Readings from Last 3 Encounters:  08/07/21 140/70  06/04/21 (!) 142/72  04/27/21 130/80    Wt Readings from Last 3 Encounters:  08/07/21 150 lb (68 kg)  06/04/21 145 lb 12.8 oz (66.1 kg)  04/27/21 147 lb 3.2 oz (66.8 kg)    Physical Exam  Lab Results  Component Value Date   WBC 8.2 03/26/2021   HGB 12.6 (L) 03/26/2021   HCT 37.8 03/26/2021   PLT 231 03/26/2021   GLUCOSE 205 (H) 03/23/2021   CHOL 126 04/24/2021   TRIG 291 (H) 04/24/2021   HDL 42 04/24/2021   LDLCALC 40 04/24/2021   ALT 19 04/24/2021   AST 23 03/23/2021   NA 136 03/23/2021   K 3.9 03/23/2021   CL 99  03/23/2021   CREATININE 1.40 (H) 03/23/2021   BUN 19 03/23/2021   CO2 24 03/23/2021   TSH 0.10 (L) 03/05/2021   PSA 2.17 03/26/2020   HGBA1C 7.5 (H) 03/05/2021   MICROALBUR 2.3 (H) 03/26/2020    CT RENAL STONE STUDY  Result Date: 03/23/2021 CLINICAL DATA:  Left-sided flank pain, history of renal calculi EXAM: CT ABDOMEN AND PELVIS WITHOUT CONTRAST TECHNIQUE: Multidetector CT imaging of the abdomen and pelvis was performed following the standard protocol without IV contrast. COMPARISON:  06/11/2019 FINDINGS: Lower chest: No acute abnormality. Hepatobiliary: No focal liver abnormality is seen. No gallstones, gallbladder wall thickening, or biliary dilatation. Pancreas: Unremarkable. No pancreatic ductal dilatation or surrounding inflammatory changes. Spleen: Normal in size without focal abnormality. Adrenals/Urinary Tract: Adrenal glands are within normal limits. Kidneys are well visualized bilaterally.  Multiple nonobstructing renal calculi are noted. Largest of these lies in the upper pole measuring proximally 4 mm. Left kidney shows significant perinephric stranding as well as hydronephrosis and hydroureter this is secondary to a left mid ureteral stone measuring 6 mm best seen on image number 58 of series 3 and image number 50 of series 6. More distal left ureter is within normal limits. Multiple cysts are seen within the left kidney stable from the prior exam. Additionally small nonobstructing left lower pole renal calculi are seen. Bladder is within normal limits. Stomach/Bowel: Colon is predominately decompressed. No inflammatory changes are seen. The appendix has been surgically removed. Small bowel and stomach are within normal limits. Vascular/Lymphatic: Aortic atherosclerosis. No enlarged abdominal or pelvic lymph nodes. Reproductive: Prostate is unremarkable. Other: No abdominal wall hernia or abnormality. No abdominopelvic ascites. Musculoskeletal: Degenerative changes of lumbar spine are noted. IMPRESSION: 6 mm left mid ureteral stone causing obstructive change and significant perinephric stranding on the left. Otherwise stable appearing nonobstructing renal calculi bilaterally. Stable appearing left renal cysts. Status post appendectomy. Electronically Signed   By: Inez Catalina M.D.   On: 03/23/2021 20:48    Assessment & Plan:   Problem List Items Addressed This Visit     CAD (coronary artery disease), native coronary artery    No CP, DOE Refused flu shot      CRI (chronic renal insufficiency), stage 2 (mild)    Monitor GFR      DM (diabetes mellitus), type 2 with complications ([place])    The pt has not started Rubelsus and Repaglinide yet Pt states BS drops too low on Repaglinide We will  try Rybelsus      Relevant Medications   Semaglutide (RYBELSUS) 3 MG TABS   Hyperthyroidism    Monitoring TSH         Meds ordered this encounter  Medications   Semaglutide (RYBELSUS)  3 MG TABS    Sig: Take 3 mg by mouth daily.    Dispense:  30 tablet    Refill:  3      Follow-up: Return in about 3 months (around 11/04/2021) for a follow-up visit.  Walker Kehr, MD

## 2021-08-07 NOTE — Assessment & Plan Note (Signed)
No CP, DOE Refused flu shot

## 2021-09-08 ENCOUNTER — Ambulatory Visit (INDEPENDENT_AMBULATORY_CARE_PROVIDER_SITE_OTHER): Payer: HMO | Admitting: Internal Medicine

## 2021-09-08 ENCOUNTER — Encounter: Payer: Self-pay | Admitting: Internal Medicine

## 2021-09-08 ENCOUNTER — Other Ambulatory Visit: Payer: Self-pay

## 2021-09-08 DIAGNOSIS — K219 Gastro-esophageal reflux disease without esophagitis: Secondary | ICD-10-CM | POA: Diagnosis not present

## 2021-09-08 DIAGNOSIS — I10 Essential (primary) hypertension: Secondary | ICD-10-CM | POA: Diagnosis not present

## 2021-09-08 DIAGNOSIS — N320 Bladder-neck obstruction: Secondary | ICD-10-CM | POA: Diagnosis not present

## 2021-09-08 DIAGNOSIS — E118 Type 2 diabetes mellitus with unspecified complications: Secondary | ICD-10-CM

## 2021-09-08 LAB — URINALYSIS
Bilirubin Urine: NEGATIVE
Ketones, ur: NEGATIVE
Leukocytes,Ua: NEGATIVE
Nitrite: NEGATIVE
Specific Gravity, Urine: 1.025 (ref 1.000–1.030)
Total Protein, Urine: NEGATIVE
Urine Glucose: NEGATIVE
Urobilinogen, UA: 0.2 (ref 0.0–1.0)
pH: 5.5 (ref 5.0–8.0)

## 2021-09-08 LAB — COMPREHENSIVE METABOLIC PANEL
ALT: 27 U/L (ref 0–53)
AST: 20 U/L (ref 0–37)
Albumin: 4.6 g/dL (ref 3.5–5.2)
Alkaline Phosphatase: 50 U/L (ref 39–117)
BUN: 22 mg/dL (ref 6–23)
CO2: 31 mEq/L (ref 19–32)
Calcium: 10.3 mg/dL (ref 8.4–10.5)
Chloride: 99 mEq/L (ref 96–112)
Creatinine, Ser: 1.21 mg/dL (ref 0.40–1.50)
GFR: 60.43 mL/min (ref >60.00)
Glucose, Bld: 196 mg/dL — ABNORMAL HIGH (ref 70–99)
Potassium: 4 mEq/L (ref 3.5–5.1)
Sodium: 139 mEq/L (ref 135–145)
Total Bilirubin: 0.6 mg/dL (ref 0.2–1.2)
Total Protein: 7.2 g/dL (ref 6.0–8.3)

## 2021-09-08 MED ORDER — TAMSULOSIN HCL 0.4 MG PO CAPS: 0.4000 mg | ORAL_CAPSULE | Freq: Every day | ORAL | 11 refills | Status: DC

## 2021-09-08 NOTE — Assessment & Plan Note (Signed)
Cont w/Pepcid prn ?

## 2021-09-08 NOTE — Assessment & Plan Note (Signed)
Worse ?Re-start Flomax ?Check UA ?

## 2021-09-08 NOTE — Assessment & Plan Note (Signed)
Cont w/Olmesart HCT, Norvasc 

## 2021-09-08 NOTE — Assessment & Plan Note (Signed)
Discussed.

## 2021-09-08 NOTE — Progress Notes (Signed)
? ?Subjective:  ?Patient ID: Ryan Ferguson, male    DOB: [birth year]  Age: 71 y.o. MRN: [id number] ? ?CC: Follow-up (3 month f/u) ? ? ?HPI ?Ryan Ferguson presents for HTN, BPH - worse. Not taking Flomax. F/u DM. ? ?Outpatient Medications Prior to Visit  ?Medication Sig Dispense Refill  ? amLODipine (NORVASC) 5 MG tablet Take 1 tablet (5 mg total) by mouth daily. 90 tablet 1  ? aspirin EC 81 MG tablet Take 1 tablet (81 mg total) by mouth daily. 90 tablet 3  ? cholecalciferol (VITAMIN D) 1000 UNITS tablet Take 1,000 Units by mouth daily.    ? clopidogrel (PLAVIX) 75 MG tablet Take 1 tablet (75 mg total) by mouth daily. 90 tablet 2  ? famotidine (PEPCID) 40 MG tablet Take 40 mg by mouth daily as needed for heartburn or indigestion.    ? levothyroxine (SYNTHROID) 112 MCG tablet TAKE 1 TABLET BY MOUTH IN THE MORNING 90 tablet 3  ? metFORMIN (GLUCOPHAGE) 1000 MG tablet Take 1 tablet by mouth twice daily 180 tablet 3  ? nitroGLYCERIN (NITROSTAT) 0.4 MG SL tablet Place 1 tablet (0.4 mg total) under the tongue every 5 (five) minutes as needed for chest pain. 50 tablet 3  ? olmesartan-hydrochlorothiazide (BENICAR HCT) 40-12.5 MG tablet Take 1 tablet by mouth once daily 90 tablet 3  ? ondansetron (ZOFRAN ODT) 4 MG disintegrating tablet Take 1 tablet (4 mg total) by mouth every 8 (eight) hours as needed for nausea or vomiting. 20 tablet 0  ? ONE TOUCH ULTRA TEST test strip USE TEST STRIPS TO TEST BLOOD SUGAR ONCE DAILY  4  ? oxyCODONE-acetaminophen (PERCOCET/ROXICET) 5-325 MG tablet Take 1 tablet by mouth every 4 (four) hours as needed for severe pain. 20 tablet 0  ? Semaglutide (RYBELSUS) 3 MG TABS Take 3 mg by mouth daily. 30 tablet 3  ? tamsulosin (FLOMAX) 0.4 MG CAPS capsule Take 1 capsule (0.4 mg total) by mouth daily. 30 capsule 0  ? rosuvastatin (CRESTOR) 20 MG tablet Take 1 tablet (20 mg total) by mouth daily. 90 tablet 3  ? ?No facility-administered medications prior to visit.  ? ? ?ROS: ?Review of Systems   ?Constitutional:  Negative for appetite change, fatigue and unexpected weight change.  ?HENT:  Negative for congestion, nosebleeds, sneezing, sore throat and trouble swallowing.   ?Eyes:  Negative for itching and visual disturbance.  ?Respiratory:  Negative for cough.   ?Cardiovascular:  Negative for chest pain, palpitations and leg swelling.  ?Gastrointestinal:  Negative for abdominal distention, blood in stool, diarrhea and nausea.  ?Genitourinary:  Negative for frequency and hematuria.  ?Musculoskeletal:  Negative for back pain, gait problem, joint swelling and neck pain.  ?Skin:  Negative for rash.  ?Neurological:  Negative for dizziness, tremors, speech difficulty and weakness.  ?Psychiatric/Behavioral:  Negative for agitation, dysphoric mood and sleep disturbance. The patient is not nervous/anxious.   ? ?Objective:  ?BP 118/70 (BP Location: Right Arm, Patient Position: Sitting, Cuff Size: Large)   Pulse (!) 49   Temp 98 ?F (36.7 ?C) (Oral)   Ht '5\' 6"'$  (1.676 m)   Wt 148 lb (67.1 kg)   SpO2 100%   BMI 23.89 kg/m?  ? ?BP Readings from Last 3 Encounters:  ?09/08/21 118/70  ?08/07/21 140/70  ?06/04/21 (!) 142/72  ? ? ?Wt Readings from Last 3 Encounters:  ?09/08/21 148 lb (67.1 kg)  ?08/07/21 150 lb (68 kg)  ?06/04/21 145 lb 12.8 oz (66.1 kg)  ? ? ?Physical Exam ?  Constitutional:   ?   General: He is not in acute distress. ?   Appearance: He is well-developed.  ?   Comments: NAD  ?Eyes:  ?   Conjunctiva/sclera: Conjunctivae normal.  ?   Pupils: Pupils are equal, round, and reactive to light.  ?Neck:  ?   Thyroid: No thyromegaly.  ?   Vascular: No JVD.  ?Cardiovascular:  ?   Rate and Rhythm: Normal rate and regular rhythm.  ?   Heart sounds: Normal heart sounds. No murmur heard. ?  No friction rub. No gallop.  ?Pulmonary:  ?   Effort: Pulmonary effort is normal. No respiratory distress.  ?   Breath sounds: Normal breath sounds. No wheezing or rales.  ?Chest:  ?   Chest wall: No tenderness.  ?Abdominal:  ?    General: Bowel sounds are normal. There is no distension.  ?   Palpations: Abdomen is soft. There is no mass.  ?   Tenderness: There is no abdominal tenderness. There is no guarding or rebound.  ?Musculoskeletal:     ?   General: No tenderness. Normal range of motion.  ?   Cervical back: Normal range of motion.  ?Lymphadenopathy:  ?   Cervical: No cervical adenopathy.  ?Skin: ?   General: Skin is warm and dry.  ?   Findings: No rash.  ?Neurological:  ?   Mental Status: He is alert and oriented to person, place, and time.  ?   Cranial Nerves: No cranial nerve deficit.  ?   Motor: No abnormal muscle tone.  ?   Coordination: Coordination normal.  ?   Gait: Gait normal.  ?   Deep Tendon Reflexes: Reflexes are normal and symmetric.  ?Psychiatric:     ?   Behavior: Behavior normal.     ?   Thought Content: Thought content normal.     ?   Judgment: Judgment normal.  ? ? ?Lab Results  ?Component Value Date  ? WBC 8.2 03/26/2021  ? HGB 12.6 (L) 03/26/2021  ? HCT 37.8 03/26/2021  ? PLT 231 03/26/2021  ? GLUCOSE 220 (H) 08/07/2021  ? CHOL 143 08/07/2021  ? TRIG 117.0 08/07/2021  ? HDL 54.30 08/07/2021  ? [place] 66 08/07/2021  ? ALT 21 08/07/2021  ? AST 15 08/07/2021  ? NA 136 08/07/2021  ? K 4.0 08/07/2021  ? CL 99 08/07/2021  ? CREATININE 1.15 08/07/2021  ? BUN 20 08/07/2021  ? CO2 32 08/07/2021  ? TSH 0.55 08/07/2021  ? PSA 2.17 03/26/2020  ? HGBA1C 7.4 (H) 08/07/2021  ? MICROALBUR 2.3 (H) 03/26/2020  ? ? ?CT RENAL STONE STUDY ? ?Result Date: 03/23/2021 ?CLINICAL DATA:  Left-sided flank pain, history of renal calculi EXAM: CT ABDOMEN AND PELVIS WITHOUT CONTRAST TECHNIQUE: Multidetector CT imaging of the abdomen and pelvis was performed following the standard protocol without IV contrast. COMPARISON:  06/11/2019 FINDINGS: Lower chest: No acute abnormality. Hepatobiliary: No focal liver abnormality is seen. No gallstones, gallbladder wall thickening, or biliary dilatation. Pancreas: Unremarkable. No pancreatic ductal  dilatation or surrounding inflammatory changes. Spleen: Normal in size without focal abnormality. Adrenals/Urinary Tract: Adrenal glands are within normal limits. Kidneys are well visualized bilaterally. Multiple nonobstructing renal calculi are noted. Largest of these lies in the upper pole measuring proximally 4 mm. Left kidney shows significant perinephric stranding as well as hydronephrosis and hydroureter this is secondary to a left mid ureteral stone measuring 6 mm best seen on image number 58 of series 3 and  image number 50 of series 6. More distal left ureter is within normal limits. Multiple cysts are seen within the left kidney stable from the prior exam. Additionally small nonobstructing left lower pole renal calculi are seen. Bladder is within normal limits. Stomach/Bowel: Colon is predominately decompressed. No inflammatory changes are seen. The appendix has been surgically removed. Small bowel and stomach are within normal limits. Vascular/Lymphatic: Aortic atherosclerosis. No enlarged abdominal or pelvic lymph nodes. Reproductive: Prostate is unremarkable. Other: No abdominal wall hernia or abnormality. No abdominopelvic ascites. Musculoskeletal: Degenerative changes of lumbar spine are noted. IMPRESSION: 6 mm left mid ureteral stone causing obstructive change and significant perinephric stranding on the left. Otherwise stable appearing nonobstructing renal calculi bilaterally. Stable appearing left renal cysts. Status post appendectomy. Electronically Signed   By: Inez Catalina M.D.   On: 03/23/2021 20:48  ? ? ?Assessment & Plan:  ? ?Problem List Items Addressed This Visit   ? ? DM (diabetes mellitus), type 2 with complications ([place])  ?  Discussed ? ?  ?  ? Relevant Orders  ? Comprehensive metabolic panel  ? Urinalysis  ? Hypertension  ?  Cont w/Olmesart HCT, Norvasc ?  ?  ? GERD (gastroesophageal reflux disease)  ?  Cont w/Pepcid prn ?  ?  ? Bladder neck obstruction  ?  Worse ?Re-start Flomax ?Check  UA ?  ?  ?  ? ? ?Meds ordered this encounter  ?Medications  ? tamsulosin (FLOMAX) 0.4 MG CAPS capsule  ?  Sig: Take 1 capsule (0.4 mg total) by mouth daily.  ?  Dispense:  30 capsule  ?  Refill:  11  ?  ? ? ?Follow-up: Return

## 2021-09-25 ENCOUNTER — Other Ambulatory Visit: Payer: Self-pay | Admitting: Internal Medicine

## 2021-10-16 ENCOUNTER — Other Ambulatory Visit: Payer: Self-pay | Admitting: Internal Medicine

## 2021-10-27 ENCOUNTER — Ambulatory Visit: Payer: HMO | Admitting: Cardiology

## 2021-10-27 ENCOUNTER — Encounter: Payer: Self-pay | Admitting: *Deleted

## 2021-10-27 ENCOUNTER — Encounter: Payer: Self-pay | Admitting: Cardiology

## 2021-10-27 VITALS — BP 130/70 | HR 57 | Ht 66.0 in | Wt 144.0 lb

## 2021-10-27 DIAGNOSIS — R002 Palpitations: Secondary | ICD-10-CM | POA: Diagnosis not present

## 2021-10-27 DIAGNOSIS — E7800 Pure hypercholesterolemia, unspecified: Secondary | ICD-10-CM | POA: Diagnosis not present

## 2021-10-27 DIAGNOSIS — I10 Essential (primary) hypertension: Secondary | ICD-10-CM

## 2021-10-27 DIAGNOSIS — I25119 Atherosclerotic heart disease of native coronary artery with unspecified angina pectoris: Secondary | ICD-10-CM

## 2021-10-27 NOTE — Assessment & Plan Note (Addendum)
He is concerned about some left-sided chest discomfort originating below his breast then migrating to his flank region.  Somewhat short acting at times but longer and others.  Can be with and without activity.  He is worried that this is his heart.  Recent stent placement to the diagonal in July 2022.  We will go ahead and check a pharmacologic stress test to ensure that he does not have any high risk areas of ischemia present.  He did have some residual nonobstructive CAD.  ?He is not on beta-blocker because of baseline bradycardia. ?

## 2021-10-27 NOTE — Progress Notes (Signed)
Cardiology Office Note:    Date:  10/27/2021   ID:  Ryan Ferguson, DOB 1951/04/18, MRN [id number]  PCP:  Tresa Garter, MD   [facility] Providers Cardiologist:  Donato Schultz, MD     Referring MD: Tresa Garter, MD    History of Present Illness:    Ryan Ferguson is a 71 y.o. male here for follow-up of CAD DES x2 to diagonal 12/2020 with hyperlipidemia and diabetes chronic kidney disease.  EF 55%.  Nonobstructive disease in LAD and circumflex otherwise.  Dual antiplatelet therapy for 12 months which would be July 2023.  Previously he had noted palpitations and discontinued his antihypertensive.  Monitor was placed and he was referred to cardiac rehab.  Crestor 20 mg for lipid management.  ZIO showed short runs of PAT.  No significant arrhythmias.  Overall doing well.  Intermittent palpitations when laying on the left side.  Previously noted a bad taste in his mouth.  Discussing with dentist.  Today has some left side pain flank, comes and goes. Sometimes short and longer. Mostly when lay down on left side.  This is worrisome to him.  Nervousness makes him dizzy.   Past Medical History:  Diagnosis Date   Chronic kidney disease    Colon polyps    Diabetes mellitus without complication (HCC)    does not check cbg daily   Hypertension    Hyperthyroidism    Kidney stones     Past Surgical History:  Procedure Laterality Date   APPENDECTOMY     age 64   CORONARY STENT INTERVENTION N/A 01/09/2021   Procedure: CORONARY STENT INTERVENTION;  Surgeon: Lennette Bihari, MD;  Location: [facility] IN[facility];  Service: Cardiovascular;  Laterality: N/A;   CYSTOSCOPY     LEFT HEART CATH AND CORONARY ANGIOGRAPHY N/A 01/09/2021   Procedure: LEFT HEART CATH AND CORONARY ANGIOGRAPHY;  Surgeon: Lennette Bihari, MD;  Location: [facility] INV[facility] CV [facility];  Service: Cardiovascular;  Laterality: N/A;   LITHOTRIPSY     4-5 times    Current Medications: Current Meds  Medication Sig    amLODipine (NORVASC) 5 MG tablet Take 1 tablet (5 mg total) by mouth daily.   aspirin EC 81 MG tablet Take 1 tablet (81 mg total) by mouth daily.   cholecalciferol (VITAMIN D) 1000 UNITS tablet Take 1,000 Units by mouth daily.   clopidogrel (PLAVIX) 75 MG tablet Take 1 tablet (75 mg total) by mouth daily.   famotidine (PEPCID) 40 MG tablet Take 40 mg by mouth daily as needed for heartburn or indigestion.   levothyroxine (SYNTHROID) 112 MCG tablet TAKE 1 TABLET BY MOUTH IN THE MORNING   metFORMIN (GLUCOPHAGE) 1000 MG tablet Take 1 tablet by mouth twice daily   nitroGLYCERIN (NITROSTAT) 0.4 MG SL tablet DISSOLVE ONE TABLET UNDER THE TONGUE EVERY 5 MINUTES AS NEEDED FOR CHEST PAIN.  DO NOT EXCEED A TOTAL OF 3 DOSES IN 15 MINUTES   olmesartan-hydrochlorothiazide (BENICAR HCT) 40-12.5 MG tablet Take 1 tablet by mouth once daily   ondansetron (ZOFRAN ODT) 4 MG disintegrating tablet Take 1 tablet (4 mg total) by mouth every 8 (eight) hours as needed for nausea or vomiting.   ONE TOUCH ULTRA TEST test strip USE TEST STRIPS TO TEST BLOOD SUGAR ONCE DAILY   oxyCODONE-acetaminophen (PERCOCET/ROXICET) 5-325 MG tablet Take 1 tablet by mouth every 4 (four) hours as needed for severe pain.   rosuvastatin (CRESTOR) 20 MG tablet Take 1 tablet (20 mg total) by  mouth daily.   Semaglutide (RYBELSUS) 3 MG TABS Take 3 mg by mouth daily.   tamsulosin (FLOMAX) 0.4 MG CAPS capsule Take 1 capsule (0.4 mg total) by mouth daily.     Allergies:   Hydrochlorothiazide, Lipitor [atorvastatin], Lisinopril, and Pravachol [pravastatin]   Social History   Socioeconomic History   Marital status: Married    Spouse name: Not on file   Number of children: 3   Years of education: Not on file   Highest education level: Not on file  Occupational History   Not on file  Tobacco Use   Smoking status: Former    Packs/day: 1.00    Types: Cigarettes    Quit date: 10/25/2010    Years since quitting: 11.0   Smokeless tobacco: Never   Vaping Use   Vaping Use: Never used  Substance and Sexual Activity   Alcohol use: Yes    Comment: occasional   Drug use: No   Sexual activity: Not on file  Other Topics Concern   Not on file  Social History Narrative   Not on file   Social Determinants of Health   Financial Resource Strain: Not on file  Food Insecurity: Not on file  Transportation Needs: Not on file  Physical Activity: Not on file  Stress: Not on file  Social Connections: Not on file     Family History: The patient's family history includes Diabetes in his maternal grandmother; Heart disease in his maternal grandfather, mother, and paternal grandmother; High Cholesterol in his father; Hypertension in his father; Kidney disease in his father; Lung cancer in his maternal grandfather; Lupus in his sister; Pancreatic cancer in his father; Thyroid disease in his mother; Varicose Veins in his father. There is no history of Colon cancer, Esophageal cancer, Stomach cancer, or Liver disease.  ROS:   Please see the history of present illness.    No syncope no bleeding all other systems reviewed and are negative.  EKGs/Labs/Other Studies Reviewed:    The following studies were reviewed today: ZIO 01/28/2021 Sinus rhythm with average heart rate 74 bpm with normal increase with activity Rare short runs of parox atrial tachycardia - benign Rare PAC's and PVC's No atrial fibrillation, no pauses, no ventricular tachycardia Overal reassuring monitor   Cardiac catheterization 01/09/2021:   Diagnostic Dominance: Left     Intervention     CT coronary FFR on 12/26/2020 prior to catheterization: 1. CT FFR demonstrates significant discrete stenosis of the mid D1 branch which is flow limiting. There is tapering flow limitation at the distal LAD, probably not suggestive of a discrete stenosis.   2. Consider cardiac catheterization or optimization of medical   Recent Labs: 03/26/2021: Hemoglobin 12.6; Platelets  231 08/07/2021: TSH 0.55 09/08/2021: ALT 27; BUN 22; Creatinine, Ser 1.21; Potassium 4.0; Sodium 139  Recent Lipid Panel    Component Value Date/Time   CHOL 143 08/07/2021 0944   CHOL 126 04/24/2021 0820   TRIG 117.0 08/07/2021 0944   HDL 54.30 08/07/2021 0944   HDL 42 04/24/2021 0820   CHOLHDL 3 08/07/2021 0944   VLDL 23.4 08/07/2021 0944   LDLCALC 66 08/07/2021 0944   LDLCALC 40 04/24/2021 0820     Risk Assessment/Calculations:              Physical Exam:    VS:  BP 130/70 (BP Location: Left Arm, Patient Position: Sitting, Cuff Size: Normal)   Pulse (!) 57   Ht 5\' 6"  (1.676 m)   Wt 144 lb (  65.3 kg)   SpO2 96%   BMI 23.24 kg/m     Wt Readings from Last 3 Encounters:  10/27/21 144 lb (65.3 kg)  09/08/21 148 lb (67.1 kg)  08/07/21 150 lb (68 kg)     GEN:  Well nourished, well developed in no acute distress HEENT: Normal NECK: No JVD; No carotid bruits LYMPHATICS: No lymphadenopathy CARDIAC: RRR, no murmurs, no rubs, gallops RESPIRATORY:  Clear to auscultation without rales, wheezing or rhonchi  ABDOMEN: Soft, non-tender, non-distended MUSCULOSKELETAL:  No edema; No deformity  SKIN: Warm and dry NEUROLOGIC:  Alert and oriented x 3 PSYCHIATRIC:  Normal affect   ASSESSMENT:    1. Coronary artery disease involving native coronary artery of native heart with angina pectoris (HCC)   2. Pure hypercholesterolemia   3. Primary hypertension   4. Palpitations    PLAN:    In order of problems listed above:  CAD (coronary artery disease), native coronary artery He is concerned about some left-sided chest discomfort originating below his breast then migrating to his flank region.  Somewhat short acting at times but longer and others.  Can be with and without activity.  He is worried that this is his heart.  Recent stent placement to the diagonal in July 2022.  We will go ahead and check a pharmacologic stress test to ensure that he does not have any high risk areas of  ischemia present.  He did have some residual nonobstructive CAD.  He is not on beta-blocker because of baseline bradycardia.  Hyperlipidemia Continue with Crestor 20 mg LDL 66.  Excellent.  No myalgias.  Hypertension Discouraged the use of wrist cuff/watch that checks blood pressures due to potential inaccuracies.  Encouraged arm blood pressure cuff.  Continue with current antihypertensives, medical management, amlodipine 5 mg olmesartan HCT 40/12.5 mg  Palpitations Previous ZIO monitor reviewed from August 2022.  Had short runs of PAT.  Overall benign.  Continue hydration.  He sometimes feels this when laying on the left side.   CAD s/p DES x 2 to LAD 12/2020 - Stable with no anginal symptoms. No indication for ischemic evaluation.  GDMT includes aspirin, brilinta, rosuvastatin, PRN nitroglycerin. No BB due to baseline bradycardia.    HLD, LDL goal <70 - 04/24/2021 LDL 40. Continue Crestor 20mg  QD. Of note, taking Atorvastatin as well and educated to discontinue.   HTN - BP well controlled. Of note, taking Benicar and Lisinopril. Will DC Lisinopril. Continue Amlodipine 5mg  QD. Home monitoring encouraged.    Hypothyroidism - Continue to follow with PCP.    CKD - Careful titration of diuretic and antihypertensive.     DM2 - 03/05/21 A1c 7.5. Continue to follow with PCP. If additional agent needed consider SGLT2i or GLP1 for cardioprotective benefit.    Palpitations - ZIO 01/2021 with NSR and rare short runs paroxysmal atrial tachycardia. Notes palpitations only when laying on left side. Recommend adequate hydration, avoiding caffeine, managing stress well.      Medication Adjustments/Labs and Tests Ordered: Current medicines are reviewed at length with the patient today.  Concerns regarding medicines are outlined above.  Orders Placed This Encounter  Procedures   MYOCARDIAL PERFUSION IMAGING   No orders of the defined types were placed in this encounter.   Patient Instructions   Medication Instructions:  The current medical regimen is effective;  continue present plan and medications.  *If you need a refill on your cardiac medications before your next appointment, please call your pharmacy*  Testing/Procedures: Your physician  has requested that you have a lexiscan myoview. For further information please visit [email or web address] Please follow instruction sheet, as given.  Follow-Up: At [facility], you and your health needs are our priority.  As part of our continuing mission to provide you with exceptional heart care, we have created designated Provider Care Teams.  These Care Teams include your primary Cardiologist (physician) and Advanced Practice Providers (APPs -  Physician Assistants and Nurse Practitioners) who all work together to provide you with the care you need, when you need it.  We recommend signing up for the patient portal called "MyChart".  Sign up information is provided on this After Visit Summary.  MyChart is used to connect with patients for Virtual Visits (Telemedicine).  Patients are able to view lab/test results, encounter notes, upcoming appointments, etc.  Non-urgent messages can be sent to your provider as well.   To learn more about what you can do with MyChart, go to [email or web address].    Your next appointment:   6 month(s)  The format for your next appointment:   In Person  Provider:   Chelsea Aus, PA-C, Jari Favre, PA-C, Ronie Spies, PA-C, Robin Searing, NP, Jacolyn Reedy, PA-C, Eligha Bridegroom, NP, or Tereso Newcomer, PA-C     {   Important Information About Sugar         Signed, Donato Schultz, MD  10/27/2021 10:43 AM    [facility]

## 2021-10-27 NOTE — Assessment & Plan Note (Signed)
Previous ZIO monitor reviewed from August 2022.  Had short runs of PAT.  Overall benign.  Continue hydration.  He sometimes feels this when laying on the left side. ?

## 2021-10-27 NOTE — Patient Instructions (Signed)
Medication Instructions:  ?The current medical regimen is effective;  continue present plan and medications. ? ?*If you need a refill on your cardiac medications before your next appointment, please call your pharmacy* ? ?Testing/Procedures: ?Your physician has requested that you have a lexiscan myoview. For further information please visit [email or web address]. Please follow instruction sheet, as given. ? ?Follow-Up: ?At [facility], you and your health needs are our priority.  As part of our continuing mission to provide you with exceptional heart care, we have created designated Provider Care Teams.  These Care Teams include your primary Cardiologist (physician) and Advanced Practice Providers (APPs -  Physician Assistants and Nurse Practitioners) who all work together to provide you with the care you need, when you need it. ? ?We recommend signing up for the patient portal called "MyChart".  Sign up information is provided on this After Visit Summary.  MyChart is used to connect with patients for Virtual Visits (Telemedicine).  Patients are able to view lab/test results, encounter notes, upcoming appointments, etc.  Non-urgent messages can be sent to your provider as well.   ?To learn more about what you can do with MyChart, go to [email or web address].   ? ?Your next appointment:   ?6 month(s) ? ?The format for your next appointment:   ?In Person ? ?Provider:   ?Robbie Lis, PA-C, Nicholes Rough, PA-C, Melina Copa, PA-C, Ambrose Pancoast, NP, Ermalinda Barrios, PA-C, Christen Bame, NP, or Richardson Dopp, PA-C     { ? ? ?Important Information About Sugar ? ? ? ? ?  ?

## 2021-10-27 NOTE — Assessment & Plan Note (Signed)
Discouraged the use of wrist cuff/watch that checks blood pressures due to potential inaccuracies.  Encouraged arm blood pressure cuff.  Continue with current antihypertensives, medical management, amlodipine 5 mg olmesartan HCT 40/12.5 mg ?

## 2021-10-27 NOTE — Assessment & Plan Note (Signed)
Continue with Crestor 20 mg LDL 66.  Excellent.  No myalgias. ?

## 2021-10-28 ENCOUNTER — Telehealth (HOSPITAL_COMMUNITY): Payer: Self-pay

## 2021-10-28 NOTE — Telephone Encounter (Signed)
Spoke with the patient's wife, detailed instructions given. She stated that he would be here for his test. Asked to call back with any questions. S.Marlenne Ridge EMTP  ?

## 2021-11-03 ENCOUNTER — Ambulatory Visit (HOSPITAL_COMMUNITY): Payer: HMO | Attending: Cardiovascular Disease

## 2021-11-03 DIAGNOSIS — I25119 Atherosclerotic heart disease of native coronary artery with unspecified angina pectoris: Secondary | ICD-10-CM | POA: Insufficient documentation

## 2021-11-03 LAB — MYOCARDIAL PERFUSION IMAGING
LV dias vol: 79 mL (ref 62–150)
LV sys vol: 29 mL
Nuc Stress EF: 64 %
Peak HR: 85 {beats}/min
Rest HR: 52 {beats}/min
Rest Nuclear Isotope Dose: 10.2 mCi
SDS: 1
SRS: 0
SSS: 1
ST Depression (mm): 0 mm
Stress Nuclear Isotope Dose: 32.5 mCi
TID: 1.01

## 2021-11-03 MED ORDER — REGADENOSON 0.4 MG/5ML IV SOLN
0.4000 mg | Freq: Once | INTRAVENOUS | Status: AC
  Administered 2021-11-03: 0.4 mg via INTRAVENOUS

## 2021-11-03 MED ORDER — TECHNETIUM TC 99M TETROFOSMIN IV KIT
10.2000 | PACK | Freq: Once | INTRAVENOUS | Status: AC | PRN
  Administered 2021-11-03: 10.2 via INTRAVENOUS

## 2021-11-03 MED ORDER — TECHNETIUM TC 99M TETROFOSMIN IV KIT
32.5000 | PACK | Freq: Once | INTRAVENOUS | Status: AC | PRN
  Administered 2021-11-03: 32.5 via INTRAVENOUS

## 2021-11-09 ENCOUNTER — Encounter: Payer: Self-pay | Admitting: Internal Medicine

## 2021-11-09 ENCOUNTER — Ambulatory Visit (INDEPENDENT_AMBULATORY_CARE_PROVIDER_SITE_OTHER): Payer: HMO

## 2021-11-09 ENCOUNTER — Ambulatory Visit (INDEPENDENT_AMBULATORY_CARE_PROVIDER_SITE_OTHER): Payer: HMO | Admitting: Internal Medicine

## 2021-11-09 DIAGNOSIS — E1159 Type 2 diabetes mellitus with other circulatory complications: Secondary | ICD-10-CM | POA: Diagnosis not present

## 2021-11-09 DIAGNOSIS — R0789 Other chest pain: Secondary | ICD-10-CM

## 2021-11-09 DIAGNOSIS — I10 Essential (primary) hypertension: Secondary | ICD-10-CM | POA: Diagnosis not present

## 2021-11-09 DIAGNOSIS — D2370 Other benign neoplasm of skin of unspecified lower limb, including hip: Secondary | ICD-10-CM | POA: Insufficient documentation

## 2021-11-09 DIAGNOSIS — R0781 Pleurodynia: Secondary | ICD-10-CM | POA: Diagnosis not present

## 2021-11-09 DIAGNOSIS — M799 Soft tissue disorder, unspecified: Secondary | ICD-10-CM | POA: Insufficient documentation

## 2021-11-09 DIAGNOSIS — E118 Type 2 diabetes mellitus with unspecified complications: Secondary | ICD-10-CM

## 2021-11-09 DIAGNOSIS — M436 Torticollis: Secondary | ICD-10-CM | POA: Insufficient documentation

## 2021-11-09 DIAGNOSIS — M791 Myalgia, unspecified site: Secondary | ICD-10-CM | POA: Insufficient documentation

## 2021-11-09 DIAGNOSIS — Z87442 Personal history of urinary calculi: Secondary | ICD-10-CM | POA: Insufficient documentation

## 2021-11-09 DIAGNOSIS — I209 Angina pectoris, unspecified: Secondary | ICD-10-CM | POA: Diagnosis not present

## 2021-11-09 DIAGNOSIS — I25118 Atherosclerotic heart disease of native coronary artery with other forms of angina pectoris: Secondary | ICD-10-CM | POA: Diagnosis not present

## 2021-11-09 DIAGNOSIS — E7800 Pure hypercholesterolemia, unspecified: Secondary | ICD-10-CM | POA: Insufficient documentation

## 2021-11-09 DIAGNOSIS — Z86010 Personal history of colonic polyps: Secondary | ICD-10-CM | POA: Insufficient documentation

## 2021-11-09 LAB — HEMOGLOBIN A1C: Hgb A1c MFr Bld: 7.6 % — ABNORMAL HIGH (ref 4.6–6.5)

## 2021-11-09 LAB — SEDIMENTATION RATE: Sed Rate: 6 mm/hr (ref 0–20)

## 2021-11-09 LAB — CK: Total CK: 56 U/L (ref 7–232)

## 2021-11-09 NOTE — Assessment & Plan Note (Signed)
Cardiac check up was normal

## 2021-11-09 NOTE — Assessment & Plan Note (Signed)
His statin dose was intensified to Crestor.

## 2021-11-09 NOTE — Assessment & Plan Note (Signed)
Discussed  Pt loves sweets and tends to overeat. The pt has not started Rubelsus and Repaglinide yet 2/23 Pt states BS drops too low on Repaglinide 0.5 mg - d/c We can try Rybelsus again

## 2021-11-09 NOTE — Assessment & Plan Note (Signed)
L CP  - worse w/leaning over x long time, non-exertional. MSK rib pain? CXR, rib X ray

## 2021-11-09 NOTE — Assessment & Plan Note (Signed)
Cont w/Olmesart HCT, Norvasc 

## 2021-11-09 NOTE — Progress Notes (Signed)
Subjective:  Patient ID: Ryan Ferguson, male    DOB: 1951-06-16  Age: 71 y.o. MRN: [id number]  CC: No chief complaint on file.   HPI Ryan Ferguson presents for CAD, GERD C/o L CP  - worse w/leaning over x long time, non-exertional. C/o hand pain stiffness  Outpatient Medications Prior to Visit  Medication Sig Dispense Refill   amLODipine (NORVASC) 5 MG tablet Take 1 tablet (5 mg total) by mouth daily. 90 tablet 1   aspirin EC 81 MG tablet Take 1 tablet (81 mg total) by mouth daily. 90 tablet 3   cholecalciferol (VITAMIN D) 1000 UNITS tablet Take 1,000 Units by mouth daily.     clopidogrel (PLAVIX) 75 MG tablet Take 1 tablet (75 mg total) by mouth daily. 90 tablet 2   famotidine (PEPCID) 40 MG tablet Take 40 mg by mouth daily as needed for heartburn or indigestion.     levothyroxine (SYNTHROID) 112 MCG tablet TAKE 1 TABLET BY MOUTH IN THE MORNING 90 tablet 3   metFORMIN (GLUCOPHAGE) 1000 MG tablet Take 1 tablet by mouth twice daily 180 tablet 3   nitroGLYCERIN (NITROSTAT) 0.4 MG SL tablet DISSOLVE ONE TABLET UNDER THE TONGUE EVERY 5 MINUTES AS NEEDED FOR CHEST PAIN.  DO NOT EXCEED A TOTAL OF 3 DOSES IN 15 MINUTES 50 tablet 0   olmesartan-hydrochlorothiazide (BENICAR HCT) 40-12.5 MG tablet Take 1 tablet by mouth once daily 90 tablet 3   ondansetron (ZOFRAN ODT) 4 MG disintegrating tablet Take 1 tablet (4 mg total) by mouth every 8 (eight) hours as needed for nausea or vomiting. 20 tablet 0   ONE TOUCH ULTRA TEST test strip USE TEST STRIPS TO TEST BLOOD SUGAR ONCE DAILY  4   oxyCODONE-acetaminophen (PERCOCET/ROXICET) 5-325 MG tablet Take 1 tablet by mouth every 4 (four) hours as needed for severe pain. 20 tablet 0   tamsulosin (FLOMAX) 0.4 MG CAPS capsule Take 1 capsule (0.4 mg total) by mouth daily. 30 capsule 11   Semaglutide (RYBELSUS) 3 MG TABS Take 3 mg by mouth daily. 30 tablet 3   rosuvastatin (CRESTOR) 20 MG tablet Take 1 tablet (20 mg total) by mouth daily. 90 tablet 3   No  facility-administered medications prior to visit.    ROS: Review of Systems  Constitutional:  Negative for appetite change, fatigue and unexpected weight change.  HENT:  Negative for congestion, nosebleeds, sneezing, sore throat and trouble swallowing.   Eyes:  Negative for itching and visual disturbance.  Respiratory:  Negative for cough.   Cardiovascular:  Positive for chest pain. Negative for palpitations and leg swelling.  Gastrointestinal:  Negative for abdominal distention, blood in stool, diarrhea and nausea.  Genitourinary:  Negative for frequency and hematuria.  Musculoskeletal:  Negative for back pain, gait problem, joint swelling and neck pain.  Skin:  Negative for rash.  Neurological:  Negative for dizziness, tremors, speech difficulty and weakness.  Psychiatric/Behavioral:  Negative for agitation, dysphoric mood and sleep disturbance. The patient is not nervous/anxious.    Objective:  BP 130/78 (BP Location: Left Arm, Patient Position: Sitting, Cuff Size: Large)   Pulse 60   Temp 97.9 F (36.6 C) (Oral)   Ht '5\' 6"'$  (1.676 m)   Wt 143 lb (64.9 kg)   SpO2 95%   BMI 23.08 kg/m   BP Readings from Last 3 Encounters:  11/09/21 130/78  10/27/21 130/70  09/08/21 118/70    Wt Readings from Last 3 Encounters:  11/09/21 143 lb (64.9 kg)  11/03/21 144 lb (65.3 kg)  10/27/21 144 lb (65.3 kg)    Physical Exam Constitutional:      General: He is not in acute distress.    Appearance: He is well-developed.     Comments: NAD  Eyes:     Conjunctiva/sclera: Conjunctivae normal.     Pupils: Pupils are equal, round, and reactive to light.  Neck:     Thyroid: No thyromegaly.     Vascular: No JVD.  Cardiovascular:     Rate and Rhythm: Normal rate and regular rhythm.     Heart sounds: Normal heart sounds. No murmur heard.   No friction rub. No gallop.  Pulmonary:     Effort: Pulmonary effort is normal. No respiratory distress.     Breath sounds: Normal breath sounds. No  wheezing or rales.  Chest:     Chest wall: No tenderness.  Abdominal:     General: Bowel sounds are normal. There is no distension.     Palpations: Abdomen is soft. There is no mass.     Tenderness: There is no abdominal tenderness. There is no guarding or rebound.  Musculoskeletal:        General: No tenderness. Normal range of motion.     Cervical back: Normal range of motion.  Lymphadenopathy:     Cervical: No cervical adenopathy.  Skin:    General: Skin is warm and dry.     Findings: No rash.  Neurological:     Mental Status: He is alert and oriented to person, place, and time.     Cranial Nerves: No cranial nerve deficit.     Motor: No abnormal muscle tone.     Coordination: Coordination normal.     Gait: Gait normal.     Deep Tendon Reflexes: Reflexes are normal and symmetric.  Psychiatric:        Behavior: Behavior normal.        Thought Content: Thought content normal.        Judgment: Judgment normal.   Hands w/arthritis L ribs w/pain on palpation  Lab Results  Component Value Date   WBC 8.2 03/26/2021   HGB 12.6 (L) 03/26/2021   HCT 37.8 03/26/2021   PLT 231 03/26/2021   GLUCOSE 196 (H) 09/08/2021   CHOL 143 08/07/2021   TRIG 117.0 08/07/2021   HDL 54.30 08/07/2021   LDLCALC 66 08/07/2021   ALT 27 09/08/2021   AST 20 09/08/2021   NA 139 09/08/2021   K 4.0 09/08/2021   CL 99 09/08/2021   CREATININE 1.21 09/08/2021   BUN 22 09/08/2021   CO2 31 09/08/2021   TSH 0.55 08/07/2021   PSA 2.17 03/26/2020   HGBA1C 7.4 (H) 08/07/2021   MICROALBUR 2.3 (H) 03/26/2020    CT RENAL STONE STUDY  Result Date: 03/23/2021 CLINICAL DATA:  Left-sided flank pain, history of renal calculi EXAM: CT ABDOMEN AND PELVIS WITHOUT CONTRAST TECHNIQUE: Multidetector CT imaging of the abdomen and pelvis was performed following the standard protocol without IV contrast. COMPARISON:  06/11/2019 FINDINGS: Lower chest: No acute abnormality. Hepatobiliary: No focal liver abnormality is  seen. No gallstones, gallbladder wall thickening, or biliary dilatation. Pancreas: Unremarkable. No pancreatic ductal dilatation or surrounding inflammatory changes. Spleen: Normal in size without focal abnormality. Adrenals/Urinary Tract: Adrenal glands are within normal limits. Kidneys are well visualized bilaterally. Multiple nonobstructing renal calculi are noted. Largest of these lies in the upper pole measuring proximally 4 mm. Left kidney shows significant perinephric stranding as well as hydronephrosis and  hydroureter this is secondary to a left mid ureteral stone measuring 6 mm best seen on image number 58 of series 3 and image number 50 of series 6. More distal left ureter is within normal limits. Multiple cysts are seen within the left kidney stable from the prior exam. Additionally small nonobstructing left lower pole renal calculi are seen. Bladder is within normal limits. Stomach/Bowel: Colon is predominately decompressed. No inflammatory changes are seen. The appendix has been surgically removed. Small bowel and stomach are within normal limits. Vascular/Lymphatic: Aortic atherosclerosis. No enlarged abdominal or pelvic lymph nodes. Reproductive: Prostate is unremarkable. Other: No abdominal wall hernia or abnormality. No abdominopelvic ascites. Musculoskeletal: Degenerative changes of lumbar spine are noted. IMPRESSION: 6 mm left mid ureteral stone causing obstructive change and significant perinephric stranding on the left. Otherwise stable appearing nonobstructing renal calculi bilaterally. Stable appearing left renal cysts. Status post appendectomy. Electronically Signed   By: Inez Catalina M.D.   On: 03/23/2021 20:48    Assessment & Plan:   Problem List Items Addressed This Visit     DM (diabetes mellitus), type 2 with complications ([place])    Discussed  Pt loves sweets and tends to overeat. The pt has not started Rubelsus and Repaglinide yet 2/23 Pt states BS drops too low on Repaglinide  0.5 mg - d/c We can try Rybelsus again      Relevant Orders   Hemoglobin A1c   Sedimentation rate   Rheumatoid factor   DG Chest 2 View   DG Ribs Unilateral Left   Hypertension    Cont w/[place] HCT, Norvasc      Angina pectoris associated with type 2 diabetes mellitus ([place])     His statin dose was intensified to Crestor.      Chest pain, atypical    L CP  - worse w/leaning over x long time, non-exertional. MSK rib pain? CXR, rib X ray      Relevant Orders   CK   Hemoglobin A1c   Sedimentation rate   Rheumatoid factor   DG Chest 2 View   DG Ribs Unilateral Left   CAD (coronary artery disease), native coronary artery    Cardiac check up was normal       Relevant Orders   CK      No orders of the defined types were placed in this encounter.     Follow-up: Return in about 3 months (around 02/09/2022) for a follow-up visit.  Walker Kehr, MD

## 2021-11-10 LAB — RHEUMATOID FACTOR: Rheumatoid fact SerPl-aCnc: <14 IU/mL (ref <14)

## 2022-01-08 ENCOUNTER — Emergency Department (HOSPITAL_BASED_OUTPATIENT_CLINIC_OR_DEPARTMENT_OTHER): Payer: HMO

## 2022-01-08 ENCOUNTER — Emergency Department (HOSPITAL_BASED_OUTPATIENT_CLINIC_OR_DEPARTMENT_OTHER)
Admission: EM | Admit: 2022-01-08 | Discharge: 2022-01-08 | Disposition: A | Discharge status: Home / Self Care | Payer: HMO | Attending: Emergency Medicine | Admitting: Emergency Medicine

## 2022-01-08 ENCOUNTER — Other Ambulatory Visit: Payer: Self-pay

## 2022-01-08 ENCOUNTER — Encounter (HOSPITAL_BASED_OUTPATIENT_CLINIC_OR_DEPARTMENT_OTHER): Payer: Self-pay | Admitting: Emergency Medicine

## 2022-01-08 DIAGNOSIS — Z7984 Long term (current) use of oral hypoglycemic drugs: Secondary | ICD-10-CM | POA: Insufficient documentation

## 2022-01-08 DIAGNOSIS — K5730 Diverticulosis of large intestine without perforation or abscess without bleeding: Secondary | ICD-10-CM | POA: Diagnosis not present

## 2022-01-08 DIAGNOSIS — R109 Unspecified abdominal pain: Secondary | ICD-10-CM | POA: Diagnosis not present

## 2022-01-08 DIAGNOSIS — R748 Abnormal levels of other serum enzymes: Secondary | ICD-10-CM | POA: Insufficient documentation

## 2022-01-08 DIAGNOSIS — N200 Calculus of kidney: Secondary | ICD-10-CM | POA: Diagnosis not present

## 2022-01-08 DIAGNOSIS — R1030 Lower abdominal pain, unspecified: Secondary | ICD-10-CM | POA: Diagnosis present

## 2022-01-08 DIAGNOSIS — R110 Nausea: Secondary | ICD-10-CM | POA: Insufficient documentation

## 2022-01-08 DIAGNOSIS — Z7982 Long term (current) use of aspirin: Secondary | ICD-10-CM | POA: Diagnosis not present

## 2022-01-08 DIAGNOSIS — Z79899 Other long term (current) drug therapy: Secondary | ICD-10-CM | POA: Insufficient documentation

## 2022-01-08 DIAGNOSIS — Z7902 Long term (current) use of antithrombotics/antiplatelets: Secondary | ICD-10-CM | POA: Insufficient documentation

## 2022-01-08 LAB — COMPREHENSIVE METABOLIC PANEL
ALT: 15 U/L (ref 0–44)
AST: 13 U/L — ABNORMAL LOW (ref 15–41)
Albumin: 4.3 g/dL (ref 3.5–5.0)
Alkaline Phosphatase: 48 U/L (ref 38–126)
Anion gap: 11 (ref 5–15)
BUN: 23 mg/dL (ref 8–23)
CO2: 24 mmol/L (ref 22–32)
Calcium: 10 mg/dL (ref 8.9–10.3)
Chloride: 101 mmol/L (ref 98–111)
Creatinine, Ser: 1.13 mg/dL (ref 0.61–1.24)
GFR, Estimated: >60 mL/min (ref >60)
Glucose, Bld: 228 mg/dL — ABNORMAL HIGH (ref 70–99)
Potassium: 3.9 mmol/L (ref 3.5–5.1)
Sodium: 136 mmol/L (ref 135–145)
Total Bilirubin: 0.4 mg/dL (ref 0.3–1.2)
Total Protein: 6.5 g/dL (ref 6.5–8.1)

## 2022-01-08 LAB — CBC WITH DIFFERENTIAL/PLATELET
Abs Immature Granulocytes: 0.01 10*3/uL (ref 0.00–0.07)
Basophils Absolute: 0 10*3/uL (ref 0.0–0.1)
Basophils Relative: 1 %
Eosinophils Absolute: 0.2 10*3/uL (ref 0.0–0.5)
Eosinophils Relative: 4 %
HCT: 39.4 % (ref 39.0–52.0)
Hemoglobin: 13.4 g/dL (ref 13.0–17.0)
Immature Granulocytes: 0 %
Lymphocytes Relative: 36 %
Lymphs Abs: 1.9 10*3/uL (ref 0.7–4.0)
MCH: 30.5 pg (ref 26.0–34.0)
MCHC: 34 g/dL (ref 30.0–36.0)
MCV: 89.7 fL (ref 80.0–100.0)
Monocytes Absolute: 0.5 10*3/uL (ref 0.1–1.0)
Monocytes Relative: 10 %
Neutro Abs: 2.6 10*3/uL (ref 1.7–7.7)
Neutrophils Relative %: 49 %
Platelets: 183 10*3/uL (ref 150–400)
RBC: 4.39 MIL/uL (ref 4.22–5.81)
RDW: 12.4 % (ref 11.5–15.5)
WBC: 5.2 10*3/uL (ref 4.0–10.5)
nRBC: 0 % (ref 0.0–0.2)

## 2022-01-08 LAB — URINALYSIS, ROUTINE W REFLEX MICROSCOPIC
Bilirubin Urine: NEGATIVE
Glucose, UA: NEGATIVE mg/dL
Hgb urine dipstick: NEGATIVE
Ketones, ur: NEGATIVE mg/dL
Leukocytes,Ua: NEGATIVE
Nitrite: NEGATIVE
Protein, ur: NEGATIVE mg/dL
Specific Gravity, Urine: 1.018 (ref 1.005–1.030)
pH: 5.5 (ref 5.0–8.0)

## 2022-01-08 LAB — LIPASE, BLOOD: Lipase: 96 U/L — ABNORMAL HIGH (ref 11–51)

## 2022-01-08 MED ORDER — CYCLOBENZAPRINE HCL 10 MG PO TABS: 10.0000 mg | ORAL_TABLET | Freq: Two times a day (BID) | ORAL | 0 refills | Status: DC | PRN

## 2022-01-08 NOTE — ED Provider Notes (Signed)
[facility] EMERGENCY DEPT Provider Note   CSN: [id number] Arrival date & time: 01/08/22  0900     History  Chief Complaint  Patient presents with   Flank Pain    Ryan Ferguson is a 71 y.o. male mild nausea but denies vomitingWith history of recurring kidney stones presented ED with right-sided flank pain ongoing for 4 days, waxing and waning, radiating and from his right flank into the lower abdomen.  He denies dysuria or hematuria.  He denies diarrhea or constipation.  HPI     Home Medications Prior to Admission medications   Medication Sig Start Date End Date Taking? Authorizing Provider  cyclobenzaprine (FLEXERIL) 10 MG tablet Take 1 tablet (10 mg total) by mouth 2 (two) times daily as needed for up to 12 doses for muscle spasms. 01/08/22  Yes Wyvonnia Dusky, MD  amLODipine (NORVASC) 5 MG tablet Take 1 tablet (5 mg total) by mouth daily. 01/09/21   Cheryln Manly, NP  aspirin EC 81 MG tablet Take 1 tablet (81 mg total) by mouth daily. 05/24/19   Jerline Pain, MD  cholecalciferol (VITAMIN D) 1000 UNITS tablet Take 1,000 Units by mouth daily.    [provider]  clopidogrel (PLAVIX) 75 MG tablet Take 1 tablet (75 mg total) by mouth daily. 07/31/21   Jerline Pain, MD  famotidine (PEPCID) 40 MG tablet Take 40 mg by mouth daily as needed for heartburn or indigestion. 08/28/20   [provider]  levothyroxine (SYNTHROID) 112 MCG tablet TAKE 1 TABLET BY MOUTH IN THE MORNING 04/21/21   Plotnikov, Evie Lacks, MD  metFORMIN (GLUCOPHAGE) 1000 MG tablet Take 1 tablet by mouth twice daily 10/16/21   Plotnikov, Evie Lacks, MD  nitroGLYCERIN (NITROSTAT) 0.4 MG SL tablet DISSOLVE ONE TABLET UNDER THE TONGUE EVERY 5 MINUTES AS NEEDED FOR CHEST PAIN.  DO NOT EXCEED A TOTAL OF 3 DOSES IN 15 MINUTES 09/28/21   Plotnikov, Evie Lacks, MD  olmesartan-hydrochlorothiazide (BENICAR HCT) 40-12.5 MG tablet Take 1 tablet by mouth once daily 04/06/21   Plotnikov, Evie Lacks,  MD  ondansetron (ZOFRAN ODT) 4 MG disintegrating tablet Take 1 tablet (4 mg total) by mouth every 8 (eight) hours as needed for nausea or vomiting. 03/24/21   Isla Pence, MD  ONE TOUCH ULTRA TEST test strip USE TEST STRIPS TO TEST BLOOD SUGAR ONCE DAILY 03/07/15   [provider]  oxyCODONE-acetaminophen (PERCOCET/ROXICET) 5-325 MG tablet Take 1 tablet by mouth every 4 (four) hours as needed for severe pain. 03/24/21   Isla Pence, MD  rosuvastatin (CRESTOR) 20 MG tablet Take 1 tablet (20 mg total) by mouth daily. 01/22/21 10/27/21  Jerline Pain, MD  tamsulosin (FLOMAX) 0.4 MG CAPS capsule Take 1 capsule (0.4 mg total) by mouth daily. 09/08/21   Plotnikov, Evie Lacks, MD      Allergies    Hydrochlorothiazide, Lipitor [atorvastatin], Lisinopril, and Pravachol [pravastatin]    Review of Systems   Review of Systems  Physical Exam Updated Vital Signs BP (!) 163/74 (BP Location: Right Arm)   Pulse (!) 57   Temp 97.8 F (36.6 C)   Resp 16   Ht '5\' 6"'$  (1.676 m)   Wt 64.4 kg   SpO2 99%   BMI 22.92 kg/m  Physical Exam Constitutional:      General: He is not in acute distress. HENT:     Head: Normocephalic and atraumatic.  Eyes:     Conjunctiva/sclera: Conjunctivae normal.     Pupils:  Pupils are equal, round, and reactive to light.  Cardiovascular:     Rate and Rhythm: Normal rate and regular rhythm.  Pulmonary:     Effort: Pulmonary effort is normal. No respiratory distress.  Abdominal:     General: There is no distension.     Tenderness: There is no abdominal tenderness. There is no right CVA tenderness, left CVA tenderness or guarding.  Skin:    General: Skin is warm and dry.  Neurological:     General: No focal deficit present.     Mental Status: He is alert. Mental status is at baseline.  Psychiatric:        Mood and Affect: Mood normal.        Behavior: Behavior normal.     ED Results / Procedures / Treatments   Labs (all labs ordered are listed, but only  abnormal results are displayed) Labs Reviewed  COMPREHENSIVE METABOLIC PANEL - Abnormal; Notable for the following components:      Result Value   Glucose, Bld 228 (*)    AST 13 (*)    All other components within normal limits  LIPASE, BLOOD - Abnormal; Notable for the following components:   Lipase 96 (*)    All other components within normal limits  URINALYSIS, ROUTINE W REFLEX MICROSCOPIC  CBC WITH DIFFERENTIAL/PLATELET    EKG None  Radiology CT Renal Stone Study  Result Date: 01/08/2022 CLINICAL DATA:  Right-sided flank pain, history of ureteral stones EXAM: CT ABDOMEN AND PELVIS WITHOUT CONTRAST TECHNIQUE: Multidetector CT imaging of the abdomen and pelvis was performed following the standard protocol without IV contrast. RADIATION DOSE REDUCTION: This exam was performed according to the departmental dose-optimization program which includes automated exposure control, adjustment of the mA and/or kV according to patient size and/or use of iterative reconstruction technique. COMPARISON:  CT 03/23/2021 FINDINGS: Lower chest: 5 mm nodule in the right middle lobe is unchanged since 2008. Hepatobiliary: No focal liver abnormality is seen. No gallstones, gallbladder wall thickening, or biliary dilatation. Pancreas: Unremarkable. No pancreatic ductal dilatation or surrounding inflammatory changes. Spleen: Normal in size without focal abnormality. Adrenals/Urinary Tract: Nonobstructing 5 mm caliceal stone in the upper pole of the right kidney, unchanged from 03/23/2021. Punctate 1 mm nonobstructing stone in the lower pole of the right kidney. No additional urinary calculi. No hydronephrosis. No perinephric stranding. Unchanged left renal cystic lesions previously evaluated on MRI 07/13/2019. Normal adrenal Stomach/Bowel: Colonic diverticulosis. Normal caliber large and small bowel. Appendectomy. Vascular/Lymphatic: Aortoiliac atherosclerotic calcification. No enlarged abdominal or pelvic lymph nodes.  Reproductive: Prostate is unremarkable. Other: No free intraperitoneal fluid or air. Musculoskeletal: No acute or significant osseous findings. IMPRESSION: 1. Nonobstructing right nephrolithiasis.  No hydronephrosis. 2.  Aortic Atherosclerosis (ICD10-I70.0). Electronically Signed   By: Placido Sou M.D.   On: 01/08/2022 10:26    Procedures Procedures    Medications Ordered in ED Medications - No data to display  ED Course/ Medical Decision Making/ A&P Clinical Course as of 01/08/22 1318  Fri Jan 08, 2022  1050 On my reassessment I discussed the patient's elevated lipase levels with him and his wife.  This is not high enough or significant enough to raise concern for acute pancreatitis, although this could potentially be causing some back pain.  I think is well enough to be discharged with outpatient follow-up with gastroenterology.  The patient now clarifies to me that his back pain for the past several days is significantly worse when he wakes up in the morning, and  that he is hunched over, and as he begins walking throughout the day the pain gets better and then goes away.  This now does sound more musculoskeletal in etiology to me, therefore I think Flexeril be reasonable.  This is prescribed now. [MT]    Clinical Course User Index [MT] Ardis Fullwood, Carola Rhine, MD                           Medical Decision Making Amount and/or Complexity of Data Reviewed Labs: ordered. Radiology: ordered.  Risk Prescription drug management.   This patient presents to the ED with concern for right flank pain. This involves an extensive number of treatment options, and is a complaint that carries with it a high risk of complications and morbidity.  The differential diagnosis includes UTI versus ureteral colic versus biliary disease versus colitis versus other  Co-morbidities that complicate the patient evaluation: History of recurrent kidney stones places him at high risk for passing stone  Additional  history obtained from patient's wife at bedside  External records from outside source obtained and reviewed including CT renal stone study performed in October 2022 showing multiple nonobstructing renal calculi, at that time left kidney showing perinephric stranding and hydronephrosis and hydroureter due to a 6 mm stone.  No gallbladder abnormalities noted.  Pancreas was normal at the time  I ordered and personally interpreted labs.  The pertinent results include: UA with evidence of infection or blood.  I ordered imaging studies including CT renal stone study. I independently visualized and interpreted imaging which showed no acute findings explain the patient's symptoms are I agree with the radiologist interpretation  Clinically of the lower suspicion for AAA (no prior hx of AAA on imaging), sepsis, pyelonephritis.  Low suspicion for spinal fracture or injury.  His pain is not reproducible with movement.  It does not appear significant reproducible on exam either.  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  He is quite comfortable appearing.  He does not want any pain medications at this time.  However with his history of large kidney stones, I think CT imaging would be reasonable.   After the interventions noted above, I reevaluated the patient and found that they have: stayed the same  Dispostion:  After consideration of the diagnostic results and the patients response to treatment, I feel that the patent would benefit from outpatient follow.         Final Clinical Impression(s) / ED Diagnoses Final diagnoses:  Right flank pain  Elevated lipase    Rx / DC Orders ED Discharge Orders          Ordered    Ambulatory referral to Gastroenterology       Comments: Elevated lipase levels   01/08/22 1050    cyclobenzaprine (FLEXERIL) 10 MG tablet  2 times daily PRN        01/08/22 1050              Wyvonnia Dusky, MD 01/08/22  1318

## 2022-01-08 NOTE — ED Triage Notes (Signed)
R flank pain x 4 days. Denies N/V, urinary symptoms.

## 2022-01-08 NOTE — Discharge Instructions (Addendum)
Please make an appointment with your doctors office to follow-up.  Please avoid drinking any alcohol.  Your pancreas enzyme level, lipase, was elevated.  You will need to follow-up with a gastroenterologist for this.

## 2022-01-11 ENCOUNTER — Encounter: Payer: Self-pay | Admitting: Internal Medicine

## 2022-01-11 ENCOUNTER — Ambulatory Visit (INDEPENDENT_AMBULATORY_CARE_PROVIDER_SITE_OTHER): Payer: PPO

## 2022-01-11 DIAGNOSIS — Z0000 Encounter for general adult medical examination without abnormal findings: Secondary | ICD-10-CM | POA: Diagnosis not present

## 2022-01-11 NOTE — Patient Instructions (Signed)
Ryan Ferguson , Thank you for taking time to come for your Medicare Wellness Visit. I appreciate your ongoing commitment to your health goals. Please review the following plan we discussed and let me know if I can assist you in the future.   Screening recommendations/referrals: Colonoscopy: 09/09/2016; no repeat required Recommended yearly ophthalmology/optometry visit for glaucoma screening and checkup Recommended yearly dental visit for hygiene and checkup  Vaccinations: Influenza vaccine: declined Pneumococcal vaccine: declined Tdap vaccine: 07/09/2013; due every 10 years Shingles vaccine: declined   Covid-19: 08/20/2019, 09/18/2019, 04/19/2020  Advanced directives: No  Conditions/risks identified: Yes  Next appointment: Please schedule your next Medicare Wellness Visit with your Nurse Health Advisor in 1 year by calling [phone].  Preventive Care 56 Years and Older, Male Preventive care refers to lifestyle choices and visits with your health care provider that can promote health and wellness. What does preventive care include? A yearly physical exam. This is also called an annual well check. Dental exams once or twice a year. Routine eye exams. Ask your health care provider how often you should have your eyes checked. Personal lifestyle choices, including: Daily care of your teeth and gums. Regular physical activity. Eating a healthy diet. Avoiding tobacco and drug use. Limiting alcohol use. Practicing safe sex. Taking low doses of aspirin every day. Taking vitamin and mineral supplements as recommended by your health care provider. What happens during an annual well check? The services and screenings done by your health care provider during your annual well check will depend on your age, overall health, lifestyle risk factors, and family history of disease. Counseling  Your health care provider may ask you questions about your: Alcohol use. Tobacco use. Drug  use. Emotional well-being. Home and relationship well-being. Sexual activity. Eating habits. History of falls. Memory and ability to understand (cognition). Work and work Statistician. Screening  You may have the following tests or measurements: Height, weight, and BMI. Blood pressure. Lipid and cholesterol levels. These may be checked every 5 years, or more frequently if you are over 37 years old. Skin check. Lung cancer screening. You may have this screening every year starting at age 52 if you have a 30-pack-year history of smoking and currently smoke or have quit within the past 15 years. Fecal occult blood test (FOBT) of the stool. You may have this test every year starting at age 59. Flexible sigmoidoscopy or colonoscopy. You may have a sigmoidoscopy every 5 years or a colonoscopy every 10 years starting at age 82. Prostate cancer screening. Recommendations will vary depending on your family history and other risks. Hepatitis C blood test. Hepatitis B blood test. Sexually transmitted disease (STD) testing. Diabetes screening. This is done by checking your blood sugar (glucose) after you have not eaten for a while (fasting). You may have this done every 1-3 years. Abdominal aortic aneurysm (AAA) screening. You may need this if you are a current or former smoker. Osteoporosis. You may be screened starting at age 47 if you are at high risk. Talk with your health care provider about your test results, treatment options, and if necessary, the need for more tests. Vaccines  Your health care provider may recommend certain vaccines, such as: Influenza vaccine. This is recommended every year. Tetanus, diphtheria, and acellular pertussis (Tdap, Td) vaccine. You may need a Td booster every 10 years. Zoster vaccine. You may need this after age 52. Pneumococcal 13-valent conjugate (PCV13) vaccine. One dose is recommended after age 54. Pneumococcal polysaccharide (PPSV23) vaccine. One dose is  recommended after age 63. Talk to your health care provider about which screenings and vaccines you need and how often you need them. This information is not intended to replace advice given to you by your health care provider. Make sure you discuss any questions you have with your health care provider. Document Released: 07/04/2015 Document Revised: 02/25/2016 Document Reviewed: 04/08/2015 Elsevier Interactive Patient Education  2017 [place] Prevention in the Home Falls can cause injuries. They can happen to people of all ages. There are many things you can do to make your home safe and to help prevent falls. What can I do on the outside of my home? Regularly fix the edges of walkways and driveways and fix any cracks. Remove anything that might make you trip as you walk through a door, such as a raised step or threshold. Trim any bushes or trees on the path to your home. Use bright outdoor lighting. Clear any walking paths of anything that might make someone trip, such as rocks or tools. Regularly check to see if handrails are loose or broken. Make sure that both sides of any steps have handrails. Any raised decks and porches should have guardrails on the edges. Have any leaves, snow, or ice cleared regularly. Use sand or salt on walking paths during winter. Clean up any spills in your garage right away. This includes oil or grease spills. What can I do in the bathroom? Use night lights. Install grab bars by the toilet and in the tub and shower. Do not use towel bars as grab bars. Use non-skid mats or decals in the tub or shower. If you need to sit down in the shower, use a plastic, non-slip stool. Keep the floor dry. Clean up any water that spills on the floor as soon as it happens. Remove soap buildup in the tub or shower regularly. Attach bath mats securely with double-sided non-slip rug tape. Do not have throw rugs and other things on the floor that can make you  trip. What can I do in the bedroom? Use night lights. Make sure that you have a light by your bed that is easy to reach. Do not use any sheets or blankets that are too big for your bed. They should not hang down onto the floor. Have a firm chair that has side arms. You can use this for support while you get dressed. Do not have throw rugs and other things on the floor that can make you trip. What can I do in the kitchen? Clean up any spills right away. Avoid walking on wet floors. Keep items that you use a lot in easy-to-reach places. If you need to reach something above you, use a strong step stool that has a grab bar. Keep electrical cords out of the way. Do not use floor polish or wax that makes floors slippery. If you must use wax, use non-skid floor wax. Do not have throw rugs and other things on the floor that can make you trip. What can I do with my stairs? Do not leave any items on the stairs. Make sure that there are handrails on both sides of the stairs and use them. Fix handrails that are broken or loose. Make sure that handrails are as long as the stairways. Check any carpeting to make sure that it is firmly attached to the stairs. Fix any carpet that is loose or worn. Avoid having throw rugs at the top or bottom of the stairs. If you  do have throw rugs, attach them to the floor with carpet tape. Make sure that you have a light switch at the top of the stairs and the bottom of the stairs. If you do not have them, ask someone to add them for you. What else can I do to help prevent falls? Wear shoes that: Do not have high heels. Have rubber bottoms. Are comfortable and fit you well. Are closed at the toe. Do not wear sandals. If you use a stepladder: Make sure that it is fully opened. Do not climb a closed stepladder. Make sure that both sides of the stepladder are locked into place. Ask someone to hold it for you, if possible. Clearly mark and make sure that you can  see: Any grab bars or handrails. First and last steps. Where the edge of each step is. Use tools that help you move around (mobility aids) if they are needed. These include: Canes. Walkers. Scooters. Crutches. Turn on the lights when you go into a dark area. Replace any light bulbs as soon as they burn out. Set up your furniture so you have a clear path. Avoid moving your furniture around. If any of your floors are uneven, fix them. If there are any pets around you, be aware of where they are. Review your medicines with your doctor. Some medicines can make you feel dizzy. This can increase your chance of falling. Ask your doctor what other things that you can do to help prevent falls. This information is not intended to replace advice given to you by your health care provider. Make sure you discuss any questions you have with your health care provider. Document Released: 04/03/2009 Document Revised: 11/13/2015 Document Reviewed: 07/12/2014 Elsevier Interactive Patient Education  2017 [employer].

## 2022-01-11 NOTE — Progress Notes (Signed)
I connected with Ryan Ferguson today by telephone and verified that I am speaking with the correct person using two identifiers. Location patient: home Location provider: work Persons participating in the virtual visit: patient, provider.   I discussed the limitations, risks, security and privacy concerns of performing an evaluation and management service by telephone and the availability of in person appointments. I also discussed with the patient that there may be a patient responsible charge related to this service. The patient expressed understanding and verbally consented to this telephonic visit.    Interactive audio and video telecommunications were attempted between this provider and patient, however failed, due to patient having technical difficulties OR patient did not have access to video capability.  We continued and completed visit with audio only.  Some vital signs may be absent or patient reported.   Time Spent with patient on telephone encounter: 30 minutes  Subjective:   Ryan Ferguson is a 71 y.o. male who presents for Medicare Annual/Subsequent preventive examination.  Review of Systems     Cardiac Risk Factors include: advanced age (>34mn, >>30women);dyslipidemia;diabetes mellitus;family history of premature cardiovascular disease;hypertension;male gender     Objective:    Today's Vitals   01/11/22 1104  PainSc: 0-No pain   There is no height or weight on file to calculate BMI.     01/11/2022   11:06 AM 01/08/2022    9:06 AM 01/09/2021    9:02 AM 05/07/2020    3:10 PM 11/08/2019    8:02 AM 05/08/2019    4:04 PM 08/03/2018    4:22 PM  Advanced Directives  Does Patient Have a Medical Advance Directive? Yes No No No No No No  Type of Advance Directive Living will;Healthcare Power of Attorney        Does patient want to make changes to medical advance directive? No - Patient declined        Would patient like information on creating a medical advance  directive?   No - Patient declined No - Patient declined Yes (MAU/Ambulatory/Procedural Areas - Information given) No - Patient declined No - Patient declined    Current Medications (verified) Outpatient Encounter Medications as of 01/11/2022  Medication Sig   amLODipine (NORVASC) 5 MG tablet Take 1 tablet (5 mg total) by mouth daily.   aspirin EC 81 MG tablet Take 1 tablet (81 mg total) by mouth daily.   cholecalciferol (VITAMIN D) 1000 UNITS tablet Take 1,000 Units by mouth daily.   clopidogrel (PLAVIX) 75 MG tablet Take 1 tablet (75 mg total) by mouth daily.   cyclobenzaprine (FLEXERIL) 10 MG tablet Take 1 tablet (10 mg total) by mouth 2 (two) times daily as needed for up to 12 doses for muscle spasms.   famotidine (PEPCID) 40 MG tablet Take 40 mg by mouth daily as needed for heartburn or indigestion.   levothyroxine (SYNTHROID) 112 MCG tablet TAKE 1 TABLET BY MOUTH IN THE MORNING   metFORMIN (GLUCOPHAGE) 1000 MG tablet Take 1 tablet by mouth twice daily   nitroGLYCERIN (NITROSTAT) 0.4 MG SL tablet DISSOLVE ONE TABLET UNDER THE TONGUE EVERY 5 MINUTES AS NEEDED FOR CHEST PAIN.  DO NOT EXCEED A TOTAL OF 3 DOSES IN 15 MINUTES   olmesartan-hydrochlorothiazide (BENICAR HCT) 40-12.5 MG tablet Take 1 tablet by mouth once daily   ondansetron (ZOFRAN ODT) 4 MG disintegrating tablet Take 1 tablet (4 mg total) by mouth every 8 (eight) hours as needed for nausea or vomiting.   ONE TOUCH ULTRA TEST test  strip USE TEST STRIPS TO TEST BLOOD SUGAR ONCE DAILY   oxyCODONE-acetaminophen (PERCOCET/ROXICET) 5-325 MG tablet Take 1 tablet by mouth every 4 (four) hours as needed for severe pain.   rosuvastatin (CRESTOR) 20 MG tablet Take 1 tablet (20 mg total) by mouth daily.   tamsulosin (FLOMAX) 0.4 MG CAPS capsule Take 1 capsule (0.4 mg total) by mouth daily.   No facility-administered encounter medications on file as of 01/11/2022.    Allergies (verified) Hydrochlorothiazide, Lipitor [atorvastatin],  Lisinopril, and Pravachol [pravastatin]   History: Past Medical History:  Diagnosis Date   Chronic kidney disease    Colon polyps    Diabetes mellitus without complication ([place])    does not check cbg daily   Hypertension    Hyperthyroidism    Kidney stones    Past Surgical History:  Procedure Laterality Date   APPENDECTOMY     age 22   CORONARY STENT INTERVENTION N/A 01/09/2021   Procedure: CORONARY STENT INTERVENTION;  Surgeon: Troy Sine, MD;  Location: [facility];  Service: Cardiovascular;  Laterality: N/A;   CYSTOSCOPY     LEFT HEART CATH AND CORONARY ANGIOGRAPHY N/A 01/09/2021   Procedure: LEFT HEART CATH AND CORONARY ANGIOGRAPHY;  Surgeon: Troy Sine, MD;  Location: [facility];  Service: Cardiovascular;  Laterality: N/A;   LITHOTRIPSY     4-5 times   Family History  Problem Relation Age of Onset   Heart disease Mother    Thyroid disease Mother    High Cholesterol Father    Hypertension Father    Varicose Veins Father    Pancreatic cancer Father    Kidney disease Father    Lupus Sister    Diabetes Maternal Grandmother    Heart disease Maternal Grandfather    Lung cancer Maternal Grandfather    Heart disease Paternal Grandmother    Colon cancer Neg Hx    Esophageal cancer Neg Hx    Stomach cancer Neg Hx    Liver disease Neg Hx    Social History   Socioeconomic History   Marital status: Married    Spouse name: Not on file   Number of children: 3   Years of education: Not on file   Highest education level: Not on file  Occupational History   Not on file  Tobacco Use   Smoking status: Former    Packs/day: 1.00    Types: Cigarettes    Quit date: 10/25/2010    Years since quitting: 11.2   Smokeless tobacco: Never  Vaping Use   Vaping Use: Never used  Substance and Sexual Activity   Alcohol use: Yes    Comment: occasional   Drug use: No   Sexual activity: Not on file  Other Topics Concern   Not on file  Social History Narrative    Not on file   Social Determinants of Health   Financial Resource Strain: Low Risk  (01/11/2022)   Overall Financial Resource Strain (CARDIA)    Difficulty of Paying Living Expenses: Not hard at all  Food Insecurity: No Food Insecurity (01/11/2022)   Hunger Vital Sign    Worried About Running Out of Food in the Last Year: Never true    Ran Out of Food in the Last Year: Never true  Transportation Needs: No Transportation Needs (01/11/2022)   PRAPARE - Hydrologist (Medical): No    Lack of Transportation (Non-Medical): No  Physical Activity: Sufficiently Active (01/11/2022)   Exercise  Vital Sign    Days of Exercise per Week: 5 days    Minutes of Exercise per Session: 30 min  Stress: No Stress Concern Present (01/11/2022)   [place]    Feeling of Stress : Not at all  Social Connections: Moderately Isolated (01/11/2022)   Social Connection and Isolation Panel [NHANES]    Frequency of Communication with Friends and Family: More than three times a week    Frequency of Social Gatherings with Friends and Family: More than three times a week    Attends Religious Services: Never    Marine scientist or Organizations: No    Attends Music therapist: Never    Marital Status: Married    Tobacco Counseling Counseling given: Not Answered   Clinical Intake:  Pre-visit preparation completed: Yes  Pain : No/denies pain Pain Score: 0-No pain     BMI - recorded: 23.09 Nutritional Status: BMI of 19-24  Normal Nutritional Risks: None Diabetes: Yes CBG done?: No Did pt. bring in CBG monitor from home?: No  How often do you need to have someone help you when you read instructions, pamphlets, or other written materials from your doctor or pharmacy?: 1 - Never What is the last grade level you completed in school?: College Graduate  Diabetic? yes  Interpreter Needed?:  No  Information entered by :: [name], LPN.   Activities of Daily Living    01/11/2022   11:16 AM  In your present state of health, do you have any difficulty performing the following activities:  Hearing? 0  Vision? 0  Difficulty concentrating or making decisions? 0  Walking or climbing stairs? 0  Dressing or bathing? 0  Doing errands, shopping? 0  Preparing Food and eating ? N  Using the Toilet? N  In the past six months, have you accidently leaked urine? N  Do you have problems with loss of bowel control? N  Managing your Medications? N  Managing your Finances? N  Housekeeping or managing your Housekeeping? N    Patient Care Team: Plotnikov, Evie Lacks, MD as PCP - General (Internal Medicine) Jerline Pain, MD as PCP - Cardiology (Cardiology) Franchot Gallo, MD as Consulting Physician (Urology) Wilford Corner, MD as Consulting Physician (Gastroenterology) Delrae Rend, MD as Consulting Physician (Endocrinology) Josue Hector, MD as Consulting Physician (Cardiology) Opthamology, [facility] as Consulting Physician (Ophthalmology)  Indicate any recent Medical Services you may have received from other than [facility] providers in the past year (date may be approximate).     Assessment:   This is a routine wellness examination for [place].  Hearing/Vision screen Hearing Screening - Comments:: Patient denied any hearing difficulty.   No hearing aids.  Vision Screening - Comments:: Patient does wear readers.  Eye exam done by: [facility]   Dietary issues and exercise activities discussed: Current Exercise Habits: Home exercise routine, Type of exercise: walking, Time (Minutes): 30, Frequency (Times/Week): 5, Weekly Exercise (Minutes/Week): 150, Intensity: Moderate, Exercise limited by: None identified   Goals Addressed   None   Depression Screen    01/11/2022   11:12 AM 06/04/2021    8:45 AM 05/07/2020    3:08 PM 11/13/2019    3:18 PM  05/08/2019    3:42 PM 04/29/2017    8:46 AM  PHQ 2/9 Scores  PHQ - 2 Score 0 0 0 0 0 0  PHQ- 9 Score  0        Fall  Risk    01/11/2022   11:07 AM 09/08/2021    8:09 AM 06/04/2021    8:44 AM 05/07/2020    3:10 PM 03/26/2020   10:23 AM  Fall Risk   Falls in the past year? 0 0 0 0 0  Number falls in past yr: 0 0 0 0 0  Injury with Fall? 0 0  0 0  Risk for fall due to : No Fall Risks  No Fall Risks No Fall Risks   Follow up Falls evaluation completed   Falls evaluation completed     FALL RISK PREVENTION PERTAINING TO THE HOME:  Any stairs in or around the home? Yes  If so, are there any without handrails? No  Home free of loose throw rugs in walkways, pet beds, electrical cords, etc? Yes  Adequate lighting in your home to reduce risk of falls? Yes   ASSISTIVE DEVICES UTILIZED TO PREVENT FALLS:  Life alert? No  Use of a cane, walker or w/c? No  Grab bars in the bathroom? No  Shower chair or bench in shower? No  Elevated toilet seat or a handicapped toilet? No   TIMED UP AND GO:  Was the test performed? No .  Length of time to ambulate 10 feet: n/a sec.   Appearance of gait: Gait not evaluated during this visit.  Cognitive Function:        01/11/2022   11:18 AM  6CIT Screen  What Year? 0 points  What month? 0 points  What time? 0 points  Count back from 20 0 points  Months in reverse 0 points  Repeat phrase 0 points  Total Score 0 points    Immunizations Immunization History  Administered Date(s) Administered   PFIZER(Purple Top)SARS-COV-2 Vaccination 08/20/2019, 09/18/2019, 04/19/2020   Tdap 11/05/2008, 07/09/2013   Zoster, Live 07/10/2013    TDAP status: Up to date  Flu Vaccine status: Declined, Education has been provided regarding the importance of this vaccine but patient still declined. Advised may receive this vaccine at local pharmacy or Health Dept. Aware to provide a copy of the vaccination record if obtained from local pharmacy or Health Dept.  Verbalized acceptance and understanding.  Pneumococcal vaccine status: Declined,  Education has been provided regarding the importance of this vaccine but patient still declined. Advised may receive this vaccine at local pharmacy or Health Dept. Aware to provide a copy of the vaccination record if obtained from local pharmacy or Health Dept. Verbalized acceptance and understanding.   Covid-19 vaccine status: Completed vaccines  Qualifies for Shingles Vaccine? Yes   Zostavax completed Yes   Shingrix Completed?: No.    Education has been provided regarding the importance of this vaccine. Patient has been advised to call insurance company to determine out of pocket expense if they have not yet received this vaccine. Advised may also receive vaccine at local pharmacy or Health Dept. Verbalized acceptance and understanding.  Screening Tests Health Maintenance  Topic Date Due   FOOT EXAM  Never done   Hepatitis C Screening  Never done   Zoster Vaccines- Shingrix (1 of 2) Never done   COLONOSCOPY (Pts 45-34yr Insurance coverage will need to be confirmed)  Never done   COVID-19 Vaccine (4 - Booster for Pfizer series) 06/14/2020   Diabetic kidney evaluation - Urine ACR  03/26/2021   OPHTHALMOLOGY EXAM  07/30/2021   Pneumonia Vaccine 71 Years old (1 - PCV) 09/09/2022 (Originally 08/15/2015)   HEMOGLOBIN A1C  05/12/2022   Diabetic kidney evaluation -  GFR measurement  01/09/2023   TETANUS/TDAP  07/10/2023   HPV VACCINES  Aged Out   INFLUENZA VACCINE  Discontinued    Health Maintenance  Health Maintenance Due  Topic Date Due   FOOT EXAM  Never done   Hepatitis C Screening  Never done   Zoster Vaccines- Shingrix (1 of 2) Never done   COLONOSCOPY (Pts 45-35yr Insurance coverage will need to be confirmed)  Never done   COVID-19 Vaccine (4 - Booster for Pfizer series) 06/14/2020   Diabetic kidney evaluation - Urine ACR  03/26/2021   OPHTHALMOLOGY EXAM  07/30/2021    Colorectal cancer  screening: Type of screening: Colonoscopy. Completed 09/09/2016. Repeat every 0 years  Lung Cancer Screening: (Low Dose CT Chest recommended if Age 71-80years, 30 pack-year currently smoking OR have quit w/in 15years.) does not qualify.   Lung Cancer Screening Referral: no  Additional Screening:  Hepatitis C Screening: does qualify; Completed no  Vision Screening: Recommended annual ophthalmology exams for early detection of glaucoma and other disorders of the eye. Is the patient up to date with their annual eye exam?  Yes  Who is the provider or what is the name of the [facility] the [facility]  If pt is not established with a provider, would they like to be referred to a provider to establish care? No .   Dental Screening: Recommended annual dental exams for proper oral hygiene  Community Resource Referral / Chronic Care Management: CRR required this visit?  No   CCM required this visit?  No      Plan:     I have personally reviewed and noted the following in the patient's chart:   Medical and social history Use of alcohol, tobacco or illicit drugs  Current medications and supplements including opioid prescriptions. Patient is currently taking opioid prescriptions. Information provided to patient regarding non-opioid alternatives. Patient advised to discuss non-opioid treatment plan with their provider. Functional ability and status Nutritional status Physical activity Advanced directives List of other physicians Hospitalizations, surgeries, and ER visits in previous 12 months Vitals Screenings to include cognitive, depression, and falls Referrals and appointments  In addition, I have reviewed and discussed with patient certain preventive protocols, quality metrics, and best practice recommendations. A written personalized care plan for preventive services as well as general preventive health recommendations were  provided to patient.     SSheral Flow LPN   [id number]  Nurse Notes:  There were no vitals filed for this visit. There is no height or weight on file to calculate BMI.

## 2022-01-20 ENCOUNTER — Ambulatory Visit (INDEPENDENT_AMBULATORY_CARE_PROVIDER_SITE_OTHER): Payer: HMO | Admitting: Internal Medicine

## 2022-01-20 ENCOUNTER — Encounter: Payer: Self-pay | Admitting: Internal Medicine

## 2022-01-20 DIAGNOSIS — E1159 Type 2 diabetes mellitus with other circulatory complications: Secondary | ICD-10-CM | POA: Diagnosis not present

## 2022-01-20 DIAGNOSIS — Z789 Other specified health status: Secondary | ICD-10-CM

## 2022-01-20 DIAGNOSIS — I209 Angina pectoris, unspecified: Secondary | ICD-10-CM | POA: Diagnosis not present

## 2022-01-20 DIAGNOSIS — R109 Unspecified abdominal pain: Secondary | ICD-10-CM

## 2022-01-20 DIAGNOSIS — I10 Essential (primary) hypertension: Secondary | ICD-10-CM

## 2022-01-20 DIAGNOSIS — R748 Abnormal levels of other serum enzymes: Secondary | ICD-10-CM

## 2022-01-20 MED ORDER — PITAVASTATIN CALCIUM 1 MG PO TABS: 1.0000 mg | ORAL_TABLET | Freq: Every day | ORAL | 11 refills | Status: DC

## 2022-01-20 NOTE — Assessment & Plan Note (Signed)
CAD, angina - exertional R arm pain and fatigue, GERD status post coronary calcium score 1492 that led to [facility] analysis that showed a diagonal stenosis of significance.  He underwent cardiac catheterization in July 2022 that confirmed this.  2 separate stents were placed in a large diagonal vessel.  His statin dose was intensified to Crestor.

## 2022-01-20 NOTE — Patient Instructions (Addendum)
For a mild COVID-19 case - take zinc 50 mg a day for 1 week, vitamin C 1000 mg daily for 1 week, vitamin D2 50,000 units weekly for 2 months (unless  taking vitamin D daily already), an antioxidant Quercetin 500 mg twice a day for 1 week (if you can get it quick enough). Take Allegra or Benadryl.  Maintain good oral hydration and take Tylenol for high fever.  Call if problems. Isolate for 5 days, then wear a mask for 5 days per CDC.   Blue-Emu cream --use 2-3 times a day   

## 2022-01-20 NOTE — Assessment & Plan Note (Signed)
Cont w/Olmesart HCT, Norvasc

## 2022-01-20 NOTE — Progress Notes (Signed)
Subjective:  Patient ID: Ryan Ferguson, male    DOB: 1951/01/19  Age: 72 y.o. MRN: [id number]  CC: No chief complaint on file.   HPI JAHRELL HAMOR presents for post ER visit w/elevated lipase and nl abd CT. No recent ETOH use.  Outpatient Medications Prior to Visit  Medication Sig Dispense Refill  . amLODipine (NORVASC) 5 MG tablet Take 1 tablet (5 mg total) by mouth daily. 90 tablet 1  . aspirin EC 81 MG tablet Take 1 tablet (81 mg total) by mouth daily. 90 tablet 3  . cholecalciferol (VITAMIN D) 1000 UNITS tablet Take 1,000 Units by mouth daily.    . clopidogrel (PLAVIX) 75 MG tablet Take 1 tablet (75 mg total) by mouth daily. 90 tablet 2  . cyclobenzaprine (FLEXERIL) 10 MG tablet Take 1 tablet (10 mg total) by mouth 2 (two) times daily as needed for up to 12 doses for muscle spasms. 12 tablet 0  . famotidine (PEPCID) 40 MG tablet Take 40 mg by mouth daily as needed for heartburn or indigestion.    Marland Kitchen levothyroxine (SYNTHROID) 112 MCG tablet TAKE 1 TABLET BY MOUTH IN THE MORNING 90 tablet 3  . metFORMIN (GLUCOPHAGE) 1000 MG tablet Take 1 tablet by mouth twice daily 180 tablet 3  . nitroGLYCERIN (NITROSTAT) 0.4 MG SL tablet DISSOLVE ONE TABLET UNDER THE TONGUE EVERY 5 MINUTES AS NEEDED FOR CHEST PAIN.  DO NOT EXCEED A TOTAL OF 3 DOSES IN 15 MINUTES 50 tablet 0  . olmesartan-hydrochlorothiazide (BENICAR HCT) 40-12.5 MG tablet Take 1 tablet by mouth once daily 90 tablet 3  . ondansetron (ZOFRAN ODT) 4 MG disintegrating tablet Take 1 tablet (4 mg total) by mouth every 8 (eight) hours as needed for nausea or vomiting. 20 tablet 0  . ONE TOUCH ULTRA TEST test strip USE TEST STRIPS TO TEST BLOOD SUGAR ONCE DAILY  4  . oxyCODONE-acetaminophen (PERCOCET/ROXICET) 5-325 MG tablet Take 1 tablet by mouth every 4 (four) hours as needed for severe pain. 20 tablet 0  . tamsulosin (FLOMAX) 0.4 MG CAPS capsule Take 1 capsule (0.4 mg total) by mouth daily. 30 capsule 11  . rosuvastatin (CRESTOR) 20 MG  tablet Take 1 tablet (20 mg total) by mouth daily. 90 tablet 3   No facility-administered medications prior to visit.    ROS: Review of Systems  Constitutional:  Negative for appetite change, fatigue and unexpected weight change.  HENT:  Negative for congestion, nosebleeds, sneezing, sore throat and trouble swallowing.   Eyes:  Negative for itching and visual disturbance.  Respiratory:  Negative for cough.   Cardiovascular:  Negative for chest pain, palpitations and leg swelling.  Gastrointestinal:  Positive for abdominal pain. Negative for abdominal distention, blood in stool, diarrhea and nausea.  Genitourinary:  Negative for frequency and hematuria.  Musculoskeletal:  Positive for myalgias. Negative for back pain, gait problem, joint swelling and neck pain.  Skin:  Negative for rash.  Neurological:  Negative for dizziness, tremors, speech difficulty and weakness.  Psychiatric/Behavioral:  Negative for agitation, dysphoric mood and sleep disturbance. The patient is not nervous/anxious.     Objective:  BP 130/80 (BP Location: Left Arm, Patient Position: Sitting, Cuff Size: Normal)   Pulse 62   Temp 98.2 F (36.8 C) (Oral)   Ht '5\' 6"'$  (1.676 m)   Wt 148 lb (67.1 kg)   SpO2 97%   BMI 23.89 kg/m   BP Readings from Last 3 Encounters:  01/20/22 130/80  01/08/22 (!) 163/74  11/09/21 130/78    Wt Readings from Last 3 Encounters:  01/20/22 148 lb (67.1 kg)  01/08/22 142 lb (64.4 kg)  11/09/21 143 lb (64.9 kg)    Physical Exam Constitutional:      General: He is not in acute distress.    Appearance: Normal appearance. He is well-developed.     Comments: NAD  Eyes:     Conjunctiva/sclera: Conjunctivae normal.     Pupils: Pupils are equal, round, and reactive to light.  Neck:     Thyroid: No thyromegaly.     Vascular: No JVD.  Cardiovascular:     Rate and Rhythm: Normal rate and regular rhythm.     Heart sounds: Normal heart sounds. No murmur heard.    No friction rub.  No gallop.  Pulmonary:     Effort: Pulmonary effort is normal. No respiratory distress.     Breath sounds: Normal breath sounds. No wheezing or rales.  Chest:     Chest wall: No tenderness.  Abdominal:     General: Bowel sounds are normal. There is no distension.     Palpations: Abdomen is soft. There is no mass.     Tenderness: There is no abdominal tenderness. There is no guarding or rebound.  Musculoskeletal:        General: No tenderness. Normal range of motion.     Cervical back: Normal range of motion.  Lymphadenopathy:     Cervical: No cervical adenopathy.  Skin:    General: Skin is warm and dry.     Findings: No rash.  Neurological:     Mental Status: He is alert and oriented to person, place, and time.     Cranial Nerves: No cranial nerve deficit.     Motor: No abnormal muscle tone.     Coordination: Coordination normal.     Gait: Gait normal.     Deep Tendon Reflexes: Reflexes are normal and symmetric.  Psychiatric:        Behavior: Behavior normal.        Thought Content: Thought content normal.        Judgment: Judgment normal.  R abd wall pain - mild  Lab Results  Component Value Date   WBC 5.2 01/08/2022   HGB 13.4 01/08/2022   HCT 39.4 01/08/2022   PLT 183 01/08/2022   GLUCOSE 228 (H) 01/08/2022   CHOL 143 08/07/2021   TRIG 117.0 08/07/2021   HDL 54.30 08/07/2021   LDLCALC 66 08/07/2021   ALT 15 01/08/2022   AST 13 (L) 01/08/2022   NA 136 01/08/2022   K 3.9 01/08/2022   CL 101 01/08/2022   CREATININE 1.13 01/08/2022   BUN 23 01/08/2022   CO2 24 01/08/2022   TSH 0.55 08/07/2021   PSA 2.17 03/26/2020   HGBA1C 7.6 (H) 11/09/2021   MICROALBUR 2.3 (H) 03/26/2020    CT Renal Stone Study  Result Date: 01/08/2022 CLINICAL DATA:  Right-sided flank pain, history of ureteral stones EXAM: CT ABDOMEN AND PELVIS WITHOUT CONTRAST TECHNIQUE: Multidetector CT imaging of the abdomen and pelvis was performed following the standard protocol without IV contrast.  RADIATION DOSE REDUCTION: This exam was performed according to the departmental dose-optimization program which includes automated exposure control, adjustment of the mA and/or kV according to patient size and/or use of iterative reconstruction technique. COMPARISON:  CT 03/23/2021 FINDINGS: Lower chest: 5 mm nodule in the right middle lobe is unchanged since 2008. Hepatobiliary: No focal liver abnormality is seen. No gallstones, gallbladder wall  thickening, or biliary dilatation. Pancreas: Unremarkable. No pancreatic ductal dilatation or surrounding inflammatory changes. Spleen: Normal in size without focal abnormality. Adrenals/Urinary Tract: Nonobstructing 5 mm caliceal stone in the upper pole of the right kidney, unchanged from 03/23/2021. Punctate 1 mm nonobstructing stone in the lower pole of the right kidney. No additional urinary calculi. No hydronephrosis. No perinephric stranding. Unchanged left renal cystic lesions previously evaluated on MRI 07/13/2019. Normal adrenal Stomach/Bowel: Colonic diverticulosis. Normal caliber large and small bowel. Appendectomy. Vascular/Lymphatic: Aortoiliac atherosclerotic calcification. No enlarged abdominal or pelvic lymph nodes. Reproductive: Prostate is unremarkable. Other: No free intraperitoneal fluid or air. Musculoskeletal: No acute or significant osseous findings. IMPRESSION: 1. Nonobstructing right nephrolithiasis.  No hydronephrosis. 2.  Aortic Atherosclerosis (ICD10-I70.0). Electronically Signed   By: Placido Sou M.D.   On: 01/08/2022 10:26    Assessment & Plan:   Problem List Items Addressed This Visit     Abdominal pain    Chronic - ?MSK R side Recent CT was ok Blue-Emu cream --use 2-3 times a day      Angina pectoris associated with type 2 diabetes mellitus (HCC)    CAD, angina - exertional R arm pain and fatigue, GERD status post coronary calcium score 1492 that led to [facility] analysis that showed a diagonal stenosis of significance.  He  underwent cardiac catheterization in July 2022 that confirmed this.  2 separate stents were placed in a large diagonal vessel.  His statin dose was intensified to Crestor.      Relevant Medications   Pitavastatin Calcium 1 MG TABS   Elevated lipase     Unclear etiology. Rare alcohol. Could be due to meds Chronic - ?MSK R side abd pain Recent CT was ok The pt was referred to see Dr Michail Sermon      Hypertension    Cont w/Olmesart HCT, Norvasc      Relevant Medications   Pitavastatin Calcium 1 MG TABS   Statin intolerance    Will try Livalo         Meds ordered this encounter  Medications  . Pitavastatin Calcium 1 MG TABS    Sig: Take 1 tablet (1 mg total) by mouth daily.    Dispense:  30 tablet    Refill:  11    Crestor, Pravachol intolerant      Follow-up: Return in about 3 months (around 04/22/2022) for a follow-up visit.  Walker Kehr, MD

## 2022-01-20 NOTE — Assessment & Plan Note (Signed)
Will try Livalo 

## 2022-01-20 NOTE — Assessment & Plan Note (Addendum)
Unclear etiology. Rare alcohol. Could be due to meds Chronic - ?MSK R side abd pain Recent CT was ok The pt was referred to see Dr Michail Sermon

## 2022-01-20 NOTE — Assessment & Plan Note (Addendum)
Chronic - ?MSK R side Recent CT was ok Blue-Emu cream --use 2-3 times a day

## 2022-02-09 ENCOUNTER — Ambulatory Visit: Payer: HMO | Admitting: Internal Medicine

## 2022-02-18 ENCOUNTER — Encounter: Payer: Self-pay | Admitting: Gastroenterology

## 2022-02-18 ENCOUNTER — Ambulatory Visit (INDEPENDENT_AMBULATORY_CARE_PROVIDER_SITE_OTHER): Payer: HMO | Admitting: Gastroenterology

## 2022-02-18 VITALS — BP 130/70 | HR 60 | Ht 65.0 in | Wt 148.4 lb

## 2022-02-18 DIAGNOSIS — K5900 Constipation, unspecified: Secondary | ICD-10-CM | POA: Diagnosis not present

## 2022-02-18 DIAGNOSIS — R748 Abnormal levels of other serum enzymes: Secondary | ICD-10-CM

## 2022-02-18 DIAGNOSIS — Z800 Family history of malignant neoplasm of digestive organs: Secondary | ICD-10-CM

## 2022-02-18 DIAGNOSIS — K219 Gastro-esophageal reflux disease without esophagitis: Secondary | ICD-10-CM | POA: Diagnosis not present

## 2022-02-18 NOTE — Progress Notes (Signed)
Chief Complaint:    ER follow-up, elevated lipase  GI History: 71 year old male with a history of diabetes, CKD, HTN, hypothyroidism, nephrolithiasis, colon polyps, follows in the GI clinic for the following:  1.  Chronic constipation.  Long-term patient of Dr. Michail Sermon at [facility] gastroenterology. Colonoscopy March 2018, Dr. Michail Sermon at [facility] gastroenterology.  Indication "personal history of colonic polyps.  Last colonoscopy October 2012" findings internal hemorrhoids.  The examination was otherwise normal.  Dr. Michail Sermon recommended that the patient have repeat colon cancer screening with colonoscopy at 10-year interval.  Evaluated by Dr. Ardis Hughs 10/2020 and started on fiber supplement.  Started MiraLAX 1 cap/day in 12/2020. 2.  Routine risk for colon cancer.  See above colonoscopy March 2018 Dr. Michail Sermon at [facility] gastroenterology 3.  GERD.  Index symptoms of pyrosis and regurgitation with forward flexion.  No dysphagia.  Symptoms started in 2022, occurring 1-2 times/week.  Recommended OTC Pepcid 20 mg on demand.  Was last seen by Dr. Ardis Hughs on 12/31/2020.  HPI:     Patient is a 71 y.o. male presenting to the Gastroenterology Clinic for follow-up.   Was seen in the ER on 01/08/2022 with right-sided flank pain x4 days. Mild nausea and no emesis (although today he states he didn't have nausea).  - Normal CBC, CMP (BG 228), UA - Lipase 96 - CT renal protocol: Colon diverticulosis, appendectomy, otherwise normal GI tract, liver, pancreas, spleen.  Nonobstructing right nephrolithiasis, unchanged from previous imaging - Diagnosed with MSK pain and discharged home with Flexeril  Was seen in follow-up by his PCM on 01/20/2022.  Right flank pain has since resolved without recurrence.  No prior known history of pancreatitis or pancreatic/hepatobiliary disease. Wt stable. No change in PO intake, bowel habits. No current sxs.   Father died from Pancreatic CA.  Interestingly, patient attributes this to  exposures at [place].   Did drink 3-4 shots prior to ER, but that is very atypical for him.  No EtOH at all since ER evaluation.  Review of systems:     No chest pain, no SOB, no fevers, no urinary sx   Past Medical History:  Diagnosis Date   Chronic kidney disease    Colon polyps    Diabetes mellitus without complication (HCC)    does not check cbg daily   Hypertension    Hyperthyroidism    Kidney stones     Patient's surgical history, family medical history, social history, medications and allergies were all reviewed in Epic    Current Outpatient Medications  Medication Sig Dispense Refill   amLODipine (NORVASC) 5 MG tablet Take 1 tablet (5 mg total) by mouth daily. 90 tablet 1   aspirin EC 81 MG tablet Take 1 tablet (81 mg total) by mouth daily. 90 tablet 3   cholecalciferol (VITAMIN D) 1000 UNITS tablet Take 1,000 Units by mouth daily.     clopidogrel (PLAVIX) 75 MG tablet Take 1 tablet (75 mg total) by mouth daily. 90 tablet 2   cyclobenzaprine (FLEXERIL) 10 MG tablet Take 1 tablet (10 mg total) by mouth 2 (two) times daily as needed for up to 12 doses for muscle spasms. 12 tablet 0   famotidine (PEPCID) 40 MG tablet Take 40 mg by mouth daily as needed for heartburn or indigestion.     levothyroxine (SYNTHROID) 112 MCG tablet TAKE 1 TABLET BY MOUTH IN THE MORNING 90 tablet 3   metFORMIN (GLUCOPHAGE) 1000 MG tablet Take 1 tablet by mouth twice daily 180 tablet 3  nitroGLYCERIN (NITROSTAT) 0.4 MG SL tablet DISSOLVE ONE TABLET UNDER THE TONGUE EVERY 5 MINUTES AS NEEDED FOR CHEST PAIN.  DO NOT EXCEED A TOTAL OF 3 DOSES IN 15 MINUTES 50 tablet 0   olmesartan-hydrochlorothiazide (BENICAR HCT) 40-12.5 MG tablet Take 1 tablet by mouth once daily 90 tablet 3   ondansetron (ZOFRAN ODT) 4 MG disintegrating tablet Take 1 tablet (4 mg total) by mouth every 8 (eight) hours as needed for nausea or vomiting. 20 tablet 0   ONE TOUCH ULTRA TEST test strip USE TEST STRIPS TO TEST BLOOD SUGAR  ONCE DAILY  4   oxyCODONE-acetaminophen (PERCOCET/ROXICET) 5-325 MG tablet Take 1 tablet by mouth every 4 (four) hours as needed for severe pain. 20 tablet 0   Pitavastatin Calcium 1 MG TABS Take 1 tablet (1 mg total) by mouth daily. 30 tablet 11   tamsulosin (FLOMAX) 0.4 MG CAPS capsule Take 1 capsule (0.4 mg total) by mouth daily. 30 capsule 11   No current facility-administered medications for this visit.    Physical Exam:     BP 130/70 (BP Location: Left Arm, Patient Position: Sitting, Cuff Size: Normal)   Pulse 60   Ht '5\' 5"'$  (1.651 m)   Wt 148 lb 6 oz (67.3 kg)   BMI 24.69 kg/m   GENERAL:  Pleasant male in NAD PSYCH: : Cooperative, normal affect ABDOMEN:  Nondistended, soft, nontender. No obvious masses, no hepatomegaly. SKIN:  turgor, no lesions seen Musculoskeletal:  Normal muscle tone, normal strength NEURO: Alert and oriented x 3, no focal neurologic deficits   IMPRESSION and PLAN:    1) Elevated lipase 2) Fhx of Pancreatic CA Recent ER evaluation with incidentally noted elevated lipase (96).  No typical pancreatitis type symptoms (aside from maybe some nausea that day) and otherwise normal-appearing pancreas on CT.  No prior known personal history of pancreatic or hepatobiliary disease, but does have family history of Pancreatic Cancer (father).  Interestingly, that could have been 2/2 environmental exposures working the land surrounding [place].  - Discussed role/utility of observation with repeat lipase vs MRI/MRCP vs EUS for isolated minimal chemical pancreatitis this 71 year old gentleman. Given age of onset and family history of Pancreatic CA, we opted for MRI/MRCP - Schedule MRI/MRCP  3) Chronic constipation - Controlled on current regimen  4) GERD - Controlled on current regimen  I spent 30 minutes of time, including in depth chart review, independent review of results as outlined above, communicating results with the patient directly, face-to-face time  with the patient, coordinating care, and ordering studies and medications as appropriate, and documentation.         Lavena Bullion ,DO, FACG 02/18/2022, 9:54 AM

## 2022-02-18 NOTE — Addendum Note (Signed)
Addended by: Howell Pringle on: 02/18/2022 10:41 AM   Modules accepted: Orders

## 2022-02-18 NOTE — Patient Instructions (Addendum)
If you are age 71 or older, your body mass index should be between 23-30. Your Body mass index is 24.69 kg/m. If this is out of the aforementioned range listed, please consider follow up with your Primary Care Provider.  __________________________________________________________  The  GI providers would like to encourage you to use [facility] to communicate with providers for non-urgent requests or questions.  Due to long hold times on the telephone, sending your provider a message by [facility] may be a faster and more efficient way to get a response.  Please allow 48 business hours for a response.  Please remember that this is for non-urgent requests.   Due to recent changes in healthcare laws, you may see the results of your imaging and laboratory studies on MyChart before your provider has had a chance to review them.  We understand that in some cases there may be results that are confusing or concerning to you. Not all laboratory results come back in the same time frame and the provider may be waiting for multiple results in order to interpret others.  Please give [place] 48 hours in order for your provider to thoroughly review all the results before contacting the office for clarification of your results.   You will be contacted by [facility] in the next 2 days to arrange a MRI / MRCP.  The number on your caller ID will be [phone], please answer when they call.  If you have not heard from them in 2 days please call [phone] to schedule.     You have been scheduled for an MRI at _____ on _____. Your appointment time is ______. Please arrive to admitting (at main entrance of the hospital) 15 minutes prior to your appointment time for registration purposes. Please make certain not to have anything to eat or drink 6 hours prior to your test. In addition, if you have any metal in your body, have a pacemaker or defibrillator, please be sure to let your ordering physician know.  This test typically takes 45 minutes to 1 hour to complete. Should you need to reschedule, please call [phone] to do so.    Thank you for choosing me and [facility].  Vito Cirigliano, D.O.

## 2022-02-20 ENCOUNTER — Other Ambulatory Visit: Payer: Self-pay | Admitting: Gastroenterology

## 2022-02-20 ENCOUNTER — Ambulatory Visit (HOSPITAL_COMMUNITY)
Admission: RE | Admit: 2022-02-20 | Discharge: 2022-02-20 | Disposition: A | Discharge status: Home / Self Care | Payer: HMO | Source: Ambulatory Visit | Attending: Gastroenterology | Admitting: Gastroenterology

## 2022-02-20 DIAGNOSIS — K5900 Constipation, unspecified: Secondary | ICD-10-CM | POA: Insufficient documentation

## 2022-02-20 DIAGNOSIS — K219 Gastro-esophageal reflux disease without esophagitis: Secondary | ICD-10-CM

## 2022-02-20 DIAGNOSIS — Z800 Family history of malignant neoplasm of digestive organs: Secondary | ICD-10-CM | POA: Insufficient documentation

## 2022-02-20 DIAGNOSIS — R935 Abnormal findings on diagnostic imaging of other abdominal regions, including retroperitoneum: Secondary | ICD-10-CM | POA: Diagnosis not present

## 2022-02-20 DIAGNOSIS — R748 Abnormal levels of other serum enzymes: Secondary | ICD-10-CM | POA: Diagnosis not present

## 2022-02-20 DIAGNOSIS — Z8507 Personal history of malignant neoplasm of pancreas: Secondary | ICD-10-CM | POA: Diagnosis not present

## 2022-02-20 DIAGNOSIS — N281 Cyst of kidney, acquired: Secondary | ICD-10-CM | POA: Diagnosis not present

## 2022-02-20 MED ORDER — GADOBUTROL 1 MMOL/ML IV SOLN
6.5000 mL | Freq: Once | INTRAVENOUS | Status: AC | PRN
  Administered 2022-02-20: 6.5 mL via INTRAVENOUS

## 2022-04-05 ENCOUNTER — Encounter: Payer: Self-pay | Admitting: Internal Medicine

## 2022-04-05 ENCOUNTER — Ambulatory Visit (INDEPENDENT_AMBULATORY_CARE_PROVIDER_SITE_OTHER): Payer: HMO | Admitting: Internal Medicine

## 2022-04-05 DIAGNOSIS — R109 Unspecified abdominal pain: Secondary | ICD-10-CM

## 2022-04-05 DIAGNOSIS — I10 Essential (primary) hypertension: Secondary | ICD-10-CM | POA: Diagnosis not present

## 2022-04-05 DIAGNOSIS — Z86010 Personal history of colonic polyps: Secondary | ICD-10-CM

## 2022-04-05 DIAGNOSIS — Z789 Other specified health status: Secondary | ICD-10-CM

## 2022-04-05 DIAGNOSIS — K219 Gastro-esophageal reflux disease without esophagitis: Secondary | ICD-10-CM

## 2022-04-05 DIAGNOSIS — E118 Type 2 diabetes mellitus with unspecified complications: Secondary | ICD-10-CM

## 2022-04-05 DIAGNOSIS — R748 Abnormal levels of other serum enzymes: Secondary | ICD-10-CM | POA: Diagnosis not present

## 2022-04-05 MED ORDER — ZOLPIDEM TARTRATE 10 MG PO TABS: 5.0000 mg | ORAL_TABLET | Freq: Every evening | ORAL | 3 refills | Status: AC | PRN

## 2022-04-05 NOTE — Assessment & Plan Note (Signed)
colonoscopy March 2018 Dr. Michail Sermon at [facility] gastroenterology ?due in 2028

## 2022-04-05 NOTE — Assessment & Plan Note (Signed)
OK on  Livalo

## 2022-04-05 NOTE — Assessment & Plan Note (Signed)
Cont w/Olmesart HCT, Norvasc

## 2022-04-05 NOTE — Assessment & Plan Note (Signed)
colonoscopy March 2018 Dr. Michail Sermon at [facility] gastroenterology

## 2022-04-05 NOTE — Assessment & Plan Note (Signed)
The pt was referred

## 2022-04-05 NOTE — Assessment & Plan Note (Addendum)
We can try Rybelsus again - pt declined Will try CGM

## 2022-04-05 NOTE — Assessment & Plan Note (Signed)
colonoscopy March 2018 Dr. Michail Sermon at [facility] gastroenterology

## 2022-04-05 NOTE — Progress Notes (Signed)
Subjective:  Patient ID: Ryan Ferguson, male    DOB: October 03, 1950  Age: 71 y.o. MRN: [id number]  CC: Follow-up (3 month f/u)   HPI DAY DEERY presents for abdominal pain at night, recurrent, h/o elevated lipids, GERD, constipation  Per GI:    "1) Elevated lipase 2) Fhx of Pancreatic CA Recent ER evaluation with incidentally noted elevated lipase (96).  No typical pancreatitis type symptoms (aside from maybe some nausea that day) and otherwise normal-appearing pancreas on CT.  No prior known personal history of pancreatic or hepatobiliary disease, but does have family history of Pancreatic Cancer (father).  Interestingly, that could have been 2/2 environmental exposures working the land surrounding [place].   - Discussed role/utility of observation with repeat lipase vs MRI/MRCP vs EUS for isolated minimal chemical pancreatitis this 71 year old gentleman. Given age of onset and family history of Pancreatic CA, we opted for MRI/MRCP - Schedule MRI/MRCP   3) Chronic constipation - Controlled on current regimen   4) GERD - Controlled on current regimen   I spent 30 minutes of time, including in depth chart review, independent review of results as outlined above, communicating results with the patient directly, face-to-face time with the patient, coordinating care, and ordering studies and medications as appropriate, and documentation.  IMPRESSION: 1. Normal MRI appearance of the pancreas without ductal dilation or ductal dilation and no cystic or solid hyperenhancing pancreatic lesion identified. 2. Bilateral benign Bosniak category 1 and 2 renal cysts requiring no independent imaging follow-up.     Electronically Signed   By: Dahlia Bailiff M.D.   On: 02/22/2022 09:57 "  Outpatient Medications Prior to Visit  Medication Sig Dispense Refill   amLODipine (NORVASC) 5 MG tablet Take 1 tablet (5 mg total) by mouth daily. 90 tablet 1   aspirin EC 81 MG tablet Take 1  tablet (81 mg total) by mouth daily. 90 tablet 3   cholecalciferol (VITAMIN D) 1000 UNITS tablet Take 1,000 Units by mouth daily.     clopidogrel (PLAVIX) 75 MG tablet Take 1 tablet (75 mg total) by mouth daily. 90 tablet 2   cyclobenzaprine (FLEXERIL) 10 MG tablet Take 1 tablet (10 mg total) by mouth 2 (two) times daily as needed for up to 12 doses for muscle spasms. 12 tablet 0   famotidine (PEPCID) 40 MG tablet Take 40 mg by mouth daily as needed for heartburn or indigestion.     levothyroxine (SYNTHROID) 112 MCG tablet TAKE 1 TABLET BY MOUTH IN THE MORNING 90 tablet 3   metFORMIN (GLUCOPHAGE) 1000 MG tablet Take 1 tablet by mouth twice daily 180 tablet 3   nitroGLYCERIN (NITROSTAT) 0.4 MG SL tablet DISSOLVE ONE TABLET UNDER THE TONGUE EVERY 5 MINUTES AS NEEDED FOR CHEST PAIN.  DO NOT EXCEED A TOTAL OF 3 DOSES IN 15 MINUTES 50 tablet 0   olmesartan-hydrochlorothiazide (BENICAR HCT) 40-12.5 MG tablet Take 1 tablet by mouth once daily 90 tablet 3   ondansetron (ZOFRAN ODT) 4 MG disintegrating tablet Take 1 tablet (4 mg total) by mouth every 8 (eight) hours as needed for nausea or vomiting. 20 tablet 0   ONE TOUCH ULTRA TEST test strip USE TEST STRIPS TO TEST BLOOD SUGAR ONCE DAILY  4   oxyCODONE-acetaminophen (PERCOCET/ROXICET) 5-325 MG tablet Take 1 tablet by mouth every 4 (four) hours as needed for severe pain. 20 tablet 0   Pitavastatin Calcium 1 MG TABS Take 1 tablet (1 mg total) by mouth daily. 30 tablet 11  tamsulosin (FLOMAX) 0.4 MG CAPS capsule Take 1 capsule (0.4 mg total) by mouth daily. 30 capsule 11   No facility-administered medications prior to visit.    ROS: Review of Systems  Constitutional:  Negative for appetite change, fatigue and unexpected weight change.  HENT:  Negative for congestion, nosebleeds, sneezing, sore throat and trouble swallowing.   Eyes:  Negative for itching and visual disturbance.  Respiratory:  Negative for cough.   Cardiovascular:  Negative for chest  pain, palpitations and leg swelling.  Gastrointestinal:  Positive for abdominal distention and abdominal pain. Negative for blood in stool, diarrhea and nausea.  Genitourinary:  Negative for frequency and hematuria.  Musculoskeletal:  Negative for back pain, gait problem, joint swelling and neck pain.  Skin:  Negative for rash.  Neurological:  Negative for dizziness, tremors, speech difficulty and weakness.  Psychiatric/Behavioral:  Negative for agitation, dysphoric mood and sleep disturbance. The patient is not nervous/anxious.     Objective:  BP (!) 140/80 (BP Location: Left Arm)   Pulse (!) 56   Temp 98.2 F (36.8 C) (Oral)   Ht '5\' 5"'$  (1.651 m)   Wt 149 lb 9.6 oz (67.9 kg)   SpO2 98%   BMI 24.89 kg/m   BP Readings from Last 3 Encounters:  04/05/22 (!) 140/80  02/18/22 130/70  01/20/22 130/80    Wt Readings from Last 3 Encounters:  04/05/22 149 lb 9.6 oz (67.9 kg)  02/18/22 148 lb 6 oz (67.3 kg)  01/20/22 148 lb (67.1 kg)    Physical Exam Constitutional:      General: He is not in acute distress.    Appearance: Normal appearance. He is well-developed.     Comments: NAD  Eyes:     Conjunctiva/sclera: Conjunctivae normal.     Pupils: Pupils are equal, round, and reactive to light.  Neck:     Thyroid: No thyromegaly.     Vascular: No JVD.  Cardiovascular:     Rate and Rhythm: Normal rate and regular rhythm.     Heart sounds: Normal heart sounds. No murmur heard.    No friction rub. No gallop.  Pulmonary:     Effort: Pulmonary effort is normal. No respiratory distress.     Breath sounds: Normal breath sounds. No wheezing or rales.  Chest:     Chest wall: No tenderness.  Abdominal:     General: Bowel sounds are normal. There is no distension.     Palpations: Abdomen is soft. There is no mass.     Tenderness: There is no abdominal tenderness. There is no guarding or rebound.  Musculoskeletal:        General: No tenderness. Normal range of motion.     Cervical  back: Normal range of motion.  Lymphadenopathy:     Cervical: No cervical adenopathy.  Skin:    General: Skin is warm and dry.     Findings: No rash.  Neurological:     Mental Status: He is alert and oriented to person, place, and time.     Cranial Nerves: No cranial nerve deficit.     Motor: No abnormal muscle tone.     Coordination: Coordination normal.     Gait: Gait normal.     Deep Tendon Reflexes: Reflexes are normal and symmetric.  Psychiatric:        Behavior: Behavior normal.        Thought Content: Thought content normal.        Judgment: Judgment normal.  Lab Results  Component Value Date   WBC 5.2 01/08/2022   HGB 13.4 01/08/2022   HCT 39.4 01/08/2022   PLT 183 01/08/2022   GLUCOSE 228 (H) 01/08/2022   CHOL 143 08/07/2021   TRIG 117.0 08/07/2021   HDL 54.30 08/07/2021   LDLCALC 66 08/07/2021   ALT 15 01/08/2022   AST 13 (L) 01/08/2022   NA 136 01/08/2022   K 3.9 01/08/2022   CL 101 01/08/2022   CREATININE 1.13 01/08/2022   BUN 23 01/08/2022   CO2 24 01/08/2022   TSH 0.55 08/07/2021   PSA 2.17 03/26/2020   HGBA1C 7.6 (H) 11/09/2021   MICROALBUR 2.3 (H) 03/26/2020    MR ABDOMEN MRCP W WO CONTAST  Result Date: 02/22/2022 CLINICAL DATA:  Elevated lipase, family history of pancreatic cancer. EXAM: MRI ABDOMEN WITHOUT AND WITH CONTRAST (INCLUDING MRCP) TECHNIQUE: Multiplanar multisequence MR imaging of the abdomen was performed both before and after the administration of intravenous contrast. Heavily T2-weighted images of the biliary and pancreatic ducts were obtained, and three-dimensional MRCP images were rendered by post processing. CONTRAST:  6.89m GADAVIST GADOBUTROL 1 MMOL/ML IV SOLN COMPARISON:  Multiple priors including most recent CT January 08, 2022 and MRI December 11, 2019. FINDINGS: Lower chest: No acute abnormality. Hepatobiliary: No significant hepatic steatosis. Gallbladder is unremarkable. No biliary ductal dilation. Pancreas: No pancreatic ductal  dilation. Intrinsic T1 signal of the pancreatic parenchyma is within normal limits. No cystic or solid hyperenhancing pancreatic lesion identified. Spleen:  Splenomegaly or focal splenic lesion. Adrenals/Urinary Tract: Bilateral adrenal glands appear normal. Bilateral fluid signal renal lesions of varying degrees of complexity measuring up to 4.8 cm on image 20/8, none of which demonstrate wall/septal thickening or enhancing nodularity, consistent with benign Bosniak category 1 and 2 renal cysts requiring no independent imaging follow-up. Stomach/Bowel: Moderate volume of formed stool throughout the colon. Stomach is unremarkable for degree of distension. No pathologic dilation or evidence of acute inflammation involving loops of large or small bowel in the abdomen. Vascular/Lymphatic: The portal, splenic and superior mesenteric veins are patent. Normal caliber abdominal aorta. Pathologically enlarged abdominal lymph nodes. Other:  None. Musculoskeletal: No suspicious bone lesions identified. IMPRESSION: 1. Normal MRI appearance of the pancreas without ductal dilation or ductal dilation and no cystic or solid hyperenhancing pancreatic lesion identified. 2. Bilateral benign Bosniak category 1 and 2 renal cysts requiring no independent imaging follow-up. Electronically Signed   By: JDahlia BailiffM.D.   On: 02/22/2022 09:57   MR 3D Recon At Scanner  Result Date: 02/22/2022 CLINICAL DATA:  Elevated lipase, family history of pancreatic cancer. EXAM: MRI ABDOMEN WITHOUT AND WITH CONTRAST (INCLUDING MRCP) TECHNIQUE: Multiplanar multisequence MR imaging of the abdomen was performed both before and after the administration of intravenous contrast. Heavily T2-weighted images of the biliary and pancreatic ducts were obtained, and three-dimensional MRCP images were rendered by post processing. CONTRAST:  6.542mGADAVIST GADOBUTROL 1 MMOL/ML IV SOLN COMPARISON:  Multiple priors including most recent CT January 08, 2022 and MRI  December 11, 2019. FINDINGS: Lower chest: No acute abnormality. Hepatobiliary: No significant hepatic steatosis. Gallbladder is unremarkable. No biliary ductal dilation. Pancreas: No pancreatic ductal dilation. Intrinsic T1 signal of the pancreatic parenchyma is within normal limits. No cystic or solid hyperenhancing pancreatic lesion identified. Spleen:  Splenomegaly or focal splenic lesion. Adrenals/Urinary Tract: Bilateral adrenal glands appear normal. Bilateral fluid signal renal lesions of varying degrees of complexity measuring up to 4.8 cm on image 20/8, none of which demonstrate wall/septal thickening or  enhancing nodularity, consistent with benign Bosniak category 1 and 2 renal cysts requiring no independent imaging follow-up. Stomach/Bowel: Moderate volume of formed stool throughout the colon. Stomach is unremarkable for degree of distension. No pathologic dilation or evidence of acute inflammation involving loops of large or small bowel in the abdomen. Vascular/Lymphatic: The portal, splenic and superior mesenteric veins are patent. Normal caliber abdominal aorta. Pathologically enlarged abdominal lymph nodes. Other:  None. Musculoskeletal: No suspicious bone lesions identified. IMPRESSION: 1. Normal MRI appearance of the pancreas without ductal dilation or ductal dilation and no cystic or solid hyperenhancing pancreatic lesion identified. 2. Bilateral benign Bosniak category 1 and 2 renal cysts requiring no independent imaging follow-up. Electronically Signed   By: Dahlia Bailiff M.D.   On: 02/22/2022 09:57    Assessment & Plan:   Problem List Items Addressed This Visit     Abdominal pain    colonoscopy March 2018 Dr. Michail Sermon at [facility]      DM (diabetes mellitus), type 2 with complications [facility])    We can try Rybelsus again - pt declined Will try CGM      Relevant Orders   Comprehensive metabolic panel   Hemoglobin A1c   Lipase   Elevated lipase    The pt was referred       Relevant Orders   Comprehensive metabolic panel   Hemoglobin A1c   Lipase   GERD (gastroesophageal reflux disease)    colonoscopy March 2018 Dr. Michail Sermon at [place] gastro[facility]      History of colon polyps    colonoscopy March 2018 Dr. Michail Sermon at [place] [facility] ?due in 2028      Hypertension    Cont w/[place] HCT, Norvasc      Relevant Orders   Comprehensive metabolic panel   Hemoglobin A1c   Lipase   Statin intolerance    OK on  Livalo         Meds ordered this encounter  Medications   zolpidem (AMBIEN) 10 MG tablet    Sig: Take 0.5-1 tablets (5-10 mg total) by mouth at bedtime as needed for sleep.    Dispense:  30 tablet    Refill:  3      Follow-up: Return in about 3 months (around 07/06/2022) for a follow-up visit.  Walker Kehr, MD

## 2022-04-06 ENCOUNTER — Other Ambulatory Visit: Payer: Self-pay | Admitting: Internal Medicine

## 2022-04-22 ENCOUNTER — Ambulatory Visit: Payer: HMO | Admitting: Internal Medicine

## 2022-04-27 ENCOUNTER — Other Ambulatory Visit: Payer: Self-pay | Admitting: Internal Medicine

## 2022-05-17 ENCOUNTER — Encounter: Payer: Self-pay | Admitting: Cardiology

## 2022-05-17 ENCOUNTER — Ambulatory Visit: Payer: HMO | Attending: Cardiology | Admitting: Cardiology

## 2022-05-17 VITALS — BP 136/53 | HR 56 | Ht 65.0 in | Wt 150.2 lb

## 2022-05-17 DIAGNOSIS — I25119 Atherosclerotic heart disease of native coronary artery with unspecified angina pectoris: Secondary | ICD-10-CM | POA: Diagnosis not present

## 2022-05-17 DIAGNOSIS — E7800 Pure hypercholesterolemia, unspecified: Secondary | ICD-10-CM

## 2022-05-17 DIAGNOSIS — E1159 Type 2 diabetes mellitus with other circulatory complications: Secondary | ICD-10-CM

## 2022-05-17 DIAGNOSIS — I209 Angina pectoris, unspecified: Secondary | ICD-10-CM

## 2022-05-17 NOTE — Progress Notes (Signed)
Cardiology Office Note:    Date:  05/17/2022   ID:  Ryan Ferguson, DOB 14-Jan-1951, MRN [id number]  PCP:  Ryan Anger, MD   [facility] Providers Cardiologist:  Ryan Furbish, MD     Referring MD: Ryan Anger, MD    History of Present Illness:    Ryan Ferguson is a 71 y.o. male here for follow-up of CAD DES x2 to diagonal 12/2020 with hyperlipidemia and diabetes chronic kidney disease.  EF 55%.  Nonobstructive disease in LAD and circumflex otherwise.  Dual antiplatelet therapy for 12 months which would be July 2023.  Had nuclear stress test in May 2023 that was low risk with no ischemia.  This was after his stents were placed  Previously he had noted palpitations and discontinued his antihypertensive.  Monitor was placed and he was referred to cardiac rehab.  Appreciate lipid clinic, tried Crestor 20 mg for lipid management.  Now on Potiga statin 1 mg daily due to statin intolerance.   ZIO 2022 showed short runs of PAT.  No significant arrhythmias.  Intermittent palpitations when laying on the left side.  Previously noted a bad taste in his mouth.  Discussed with dentist.  Has seen GI for elevated lipase.  Still feeling some left-sided chest discomfort off and on.  Reassured him that stress test was excellent.  Likely musculoskeletal or noncardiac in etiology.  Nervousness makes him dizzy.   Past Medical History:  Diagnosis Date   Chronic kidney disease    Colon polyps    Diabetes mellitus without complication ([place])    does not check cbg daily   Hypertension    Hyperthyroidism    Kidney stones     Past Surgical History:  Procedure Laterality Date   APPENDECTOMY     age 19   CORONARY STENT INTERVENTION N/A 01/09/2021   Procedure: CORONARY STENT INTERVENTION;  Surgeon: Troy Sine, MD;  Location: [place] [facility];  Service: Cardiovascular;  Laterality: N/A;   CYSTOSCOPY     LEFT HEART CATH AND CORONARY ANGIOGRAPHY N/A 01/09/2021   Procedure: LEFT  HEART CATH AND CORONARY ANGIOGRAPHY;  Surgeon: Troy Sine, MD;  Location: [place] [facility];  Service: Cardiovascular;  Laterality: N/A;   LITHOTRIPSY     4-5 times    Current Medications: Current Meds  Medication Sig   amLODipine (NORVASC) 5 MG tablet Take 1 tablet (5 mg total) by mouth daily.   aspirin EC 81 MG tablet Take 1 tablet (81 mg total) by mouth daily.   cholecalciferol (VITAMIN D) 1000 UNITS tablet Take 1,000 Units by mouth daily.   cyclobenzaprine (FLEXERIL) 10 MG tablet Take 1 tablet (10 mg total) by mouth 2 (two) times daily as needed for up to 12 doses for muscle spasms.   famotidine (PEPCID) 40 MG tablet Take 40 mg by mouth daily as needed for heartburn or indigestion.   levothyroxine (SYNTHROID) 112 MCG tablet TAKE 1 TABLET BY MOUTH IN THE MORNING   metFORMIN (GLUCOPHAGE) 1000 MG tablet Take 1 tablet by mouth twice daily   nitroGLYCERIN (NITROSTAT) 0.4 MG SL tablet DISSOLVE ONE TABLET UNDER THE TONGUE EVERY 5 MINUTES AS NEEDED FOR CHEST PAIN.  DO NOT EXCEED A TOTAL OF 3 DOSES IN 15 MINUTES   olmesartan-hydrochlorothiazide (BENICAR HCT) 40-12.5 MG tablet Take 1 tablet by mouth once daily   ondansetron (ZOFRAN ODT) 4 MG disintegrating tablet Take 1 tablet (4 mg total) by mouth every 8 (eight) hours as needed for nausea  or vomiting.   ONE TOUCH ULTRA TEST test strip USE TEST STRIPS TO TEST BLOOD SUGAR ONCE DAILY   oxyCODONE-acetaminophen (PERCOCET/ROXICET) 5-325 MG tablet Take 1 tablet by mouth every 4 (four) hours as needed for severe pain.   Pitavastatin Calcium 1 MG TABS Take 1 tablet (1 mg total) by mouth daily.   zolpidem (AMBIEN) 10 MG tablet Take 0.5-1 tablets (5-10 mg total) by mouth at bedtime as needed for sleep.   [DISCONTINUED] clopidogrel (PLAVIX) 75 MG tablet Take 1 tablet (75 mg total) by mouth daily.     Allergies:   Crestor [rosuvastatin], Hydrochlorothiazide, Lipitor [atorvastatin], Lisinopril, and Pravachol [pravastatin]   Social History    Socioeconomic History   Marital status: Married    Spouse name: Not on file   Number of children: 3   Years of education: Not on file   Highest education level: Not on file  Occupational History   Not on file  Tobacco Use   Smoking status: Former    Packs/day: 1.00    Types: Cigarettes    Quit date: 10/25/2010    Years since quitting: 11.5   Smokeless tobacco: Never  Vaping Use   Vaping Use: Never used  Substance and Sexual Activity   Alcohol use: Yes    Comment: occasional   Drug use: No   Sexual activity: Not on file  Other Topics Concern   Not on file  Social History Narrative   Not on file   Social Determinants of Health   Financial Resource Strain: Low Risk  (01/11/2022)   Overall Financial Resource Strain (CARDIA)    Difficulty of Paying Living Expenses: Not hard at all  Food Insecurity: No Food Insecurity (01/11/2022)   Hunger Vital Sign    Worried About Running Out of Food in the Last Year: Never true    [place] in the Last Year: Never true  Transportation Needs: No Transportation Needs (01/11/2022)   PRAPARE - Hydrologist (Medical): No    Lack of Transportation (Non-Medical): No  Physical Activity: Sufficiently Active (01/11/2022)   Exercise Vital Sign    Days of Exercise per Week: 5 days    Minutes of Exercise per Session: 30 min  Stress: No Stress Concern Present (01/11/2022)   [place]    Feeling of Stress : Not at all  Social Connections: Moderately Isolated (01/11/2022)   Social Connection and Isolation Panel [NHANES]    Frequency of Communication with Friends and Family: More than three times a week    Frequency of Social Gatherings with Friends and Family: More than three times a week    Attends Religious Services: Never    Marine scientist or Organizations: No    Attends Music therapist: Never    Marital Status: Married      Family History: The patient's family history includes Diabetes in his maternal grandmother; Heart disease in his maternal grandfather, mother, and paternal grandmother; High Cholesterol in his father; Hypertension in his father; Kidney disease in his father; Lung cancer in his maternal grandfather; Lupus in his sister; Pancreatic cancer in his father; Thyroid disease in his mother; Varicose Veins in his father. There is no history of Colon cancer, Esophageal cancer, Stomach cancer, or Liver disease.  ROS:   Please see the history of present illness.    No syncope no bleeding all other systems reviewed and are negative.  EKGs/Labs/Other Studies  Reviewed:    The following studies were reviewed today:  EKG today shows sinus bradycardia 56 nonspecific T wave flattening.  Nuclear stress test 11/03/2021:    The study is normal. Findings are consistent with no prior ischemia and no prior myocardial infarction. The study is low risk.   No ST deviation was noted.   LV perfusion is normal. There is no evidence of ischemia. There is no evidence of infarction.   Left ventricular function is normal. Nuclear stress EF: 64 %. The left ventricular ejection fraction is normal (55-65%). End diastolic cavity size is normal. End systolic cavity size is normal.   Prior study available for comparison from 06/29/2019. No changes compared to prior study.  ZIO 01/2021 Sinus rhythm with average heart rate 74 bpm with normal increase with activity Rare short runs of parox atrial tachycardia - benign Rare PAC's and PVC's No atrial fibrillation, no pauses, no ventricular tachycardia Overal reassuring monitor   Cardiac catheterization 01/09/2021:   Diagnostic Dominance: Left    Intervention   Intervention     CT coronary FFR on 12/26/2020 prior to catheterization: 1. CT FFR demonstrates significant discrete stenosis of the mid D1 branch which is flow limiting. There is tapering flow limitation at the distal  LAD, probably not suggestive of a discrete stenosis.   2. Consider cardiac catheterization or optimization of medical     Recent Labs: 08/07/2021: TSH 0.55 01/08/2022: ALT 15; BUN 23; Creatinine, Ser 1.13; Hemoglobin 13.4; Platelets 183; Potassium 3.9; Sodium 136  Recent Lipid Panel    Component Value Date/Time   CHOL 143 08/07/2021 0944   CHOL 126 04/24/2021 0820   TRIG 117.0 08/07/2021 0944   HDL 54.30 08/07/2021 0944   HDL 42 04/24/2021 0820   CHOLHDL 3 08/07/2021 0944   VLDL 23.4 08/07/2021 0944   LDLCALC 66 08/07/2021 0944   LDLCALC 40 04/24/2021 0820     Risk Assessment/Calculations:              Physical Exam:    VS:  BP (!) 136/53   Pulse (!) 56   Ht '5\' 5"'$  (1.651 m)   Wt 150 lb 3.2 oz (68.1 kg)   SpO2 96%   BMI 24.99 kg/m     Wt Readings from Last 3 Encounters:  05/17/22 150 lb 3.2 oz (68.1 kg)  04/05/22 149 lb 9.6 oz (67.9 kg)  02/18/22 148 lb 6 oz (67.3 kg)     GEN:  Well nourished, well developed in no acute distress HEENT: Normal NECK: No JVD; No carotid bruits LYMPHATICS: No lymphadenopathy CARDIAC: RRR, no murmurs, no rubs, gallops RESPIRATORY:  Clear to auscultation without rales, wheezing or rhonchi  ABDOMEN: Soft, non-tender, non-distended MUSCULOSKELETAL:  No edema; No deformity  SKIN: Warm and dry NEUROLOGIC:  Alert and oriented x 3 PSYCHIATRIC:  Normal affect   ASSESSMENT:    1. Pure hypercholesterolemia   2. Coronary artery disease involving native coronary artery of native heart with angina pectoris ([place])   3. Angina pectoris associated with type 2 diabetes mellitus (HCC)     PLAN:    In order of problems listed above:     CAD s/p DES x 2 to LAD 12/2020 - Stable with no anginal symptoms. No indication for ischemic evaluation.  GDMT includes aspirin, brilinta, Livalo, PRN nitroglycerin. No BB due to baseline bradycardia.  Since it has been 12 months, okay to continue aspirin only.  Stop clopidogrel.   HLD, LDL goal  <70/statin intolerance- LDL 66. Continue  Livalo 1 mg QD. Of note, had intolerance to Crestor as well as atorvastatin.  Appreciate lipid clinic    HTN - BP well controlled. Of note, taking Benicar HCT, Amlodipine '5mg'$  QD. Home monitoring encouraged.    Hypothyroidism - Continue to follow with PCP.  On Synthroid   CKD - Careful titration of diuretic and antihypertensive.  Closely monitoring.   DM2 - 03/05/21 A1c 7.5. Continue to follow with PCP. If additional agent needed consider SGLT2i or GLP1 for cardioprotective benefit.    Palpitations - ZIO 01/2021 with NSR and rare short runs paroxysmal atrial tachycardia. Notes palpitations only when laying on left side. Recommend adequate hydration, avoiding caffeine, managing stress well.  Avoiding beta-blocker because of baseline bradycardia.      Medication Adjustments/Labs and Tests Ordered: Current medicines are reviewed at length with the patient today.  Concerns regarding medicines are outlined above.  Orders Placed This Encounter  Procedures   EKG 12-Lead   No orders of the defined types were placed in this encounter.   Patient Instructions  Medication Instructions:  Stop taking your clopidogrel (Plavix). Please continue taking your aspirin. *If you need a refill on your cardiac medications before your next appointment, please call your pharmacy*   Lab Work: None. If you have labs (blood work) drawn today and your tests are completely normal, you will receive your results only by: [place] (if you have MyChart) OR A paper copy in the mail If you have any lab test that is abnormal or we need to change your treatment, we will call you to review the results.   Testing/Procedures: None.   Follow-Up: At [facility], you and your health needs are our priority.  As part of our continuing mission to provide you with exceptional heart care, we have created designated Provider Care Teams.  These Care Teams include your  primary Cardiologist (physician) and Advanced Practice Providers (APPs -  Physician Assistants and Nurse Practitioners) who all work together to provide you with the care you need, when you need it.  We recommend signing up for the patient portal called "MyChart".  Sign up information is provided on this After Visit Summary.  MyChart is used to connect with patients for Virtual Visits (Telemedicine).  Patients are able to view lab/test results, encounter notes, upcoming appointments, etc.  Non-urgent messages can be sent to your provider as well.   To learn more about what you can do with MyChart, go to [email or web address].    Your next appointment:   1 year   The format for your next appointment:   In Person  Provider:   Candee Furbish, MD      Important Information About Sugar         Signed, Ryan Furbish, MD  05/17/2022 8:32 AM    [place]

## 2022-05-17 NOTE — Patient Instructions (Signed)
Medication Instructions:  Stop taking your clopidogrel (Plavix). Please continue taking your aspirin. *If you need a refill on your cardiac medications before your next appointment, please call your pharmacy*   Lab Work: None. If you have labs (blood work) drawn today and your tests are completely normal, you will receive your results only by: [place] (if you have MyChart) OR A paper copy in the mail If you have any lab test that is abnormal or we need to change your treatment, we will call you to review the results.   Testing/Procedures: None.   Follow-Up: At [facility], you and your health needs are our priority.  As part of our continuing mission to provide you with exceptional heart care, we have created designated Provider Care Teams.  These Care Teams include your primary Cardiologist (physician) and Advanced Practice Providers (APPs -  Physician Assistants and Nurse Practitioners) who all work together to provide you with the care you need, when you need it.  We recommend signing up for the patient portal called "MyChart".  Sign up information is provided on this After Visit Summary.  MyChart is used to connect with patients for Virtual Visits (Telemedicine).  Patients are able to view lab/test results, encounter notes, upcoming appointments, etc.  Non-urgent messages can be sent to your provider as well.   To learn more about what you can do with MyChart, go to [email or web address].    Your next appointment:   1 year   The format for your next appointment:   In Person  Provider:   Candee Furbish, MD      Important Information About Sugar

## 2022-06-18 ENCOUNTER — Other Ambulatory Visit: Payer: Self-pay | Admitting: Internal Medicine

## 2022-06-18 MED ORDER — OZEMPIC (0.25 OR 0.5 MG/DOSE) 2 MG/3ML ~~LOC~~ SOPN: PEN_INJECTOR | SUBCUTANEOUS | 3 refills | Status: DC

## 2022-06-18 NOTE — Progress Notes (Signed)
Rx Ozempic

## 2022-07-06 ENCOUNTER — Ambulatory Visit (INDEPENDENT_AMBULATORY_CARE_PROVIDER_SITE_OTHER): Payer: PPO | Admitting: Internal Medicine

## 2022-07-06 ENCOUNTER — Encounter: Payer: Self-pay | Admitting: Internal Medicine

## 2022-07-06 VITALS — BP 124/78 | HR 71 | Temp 97.8°F | Ht 65.0 in | Wt 151.0 lb

## 2022-07-06 DIAGNOSIS — K219 Gastro-esophageal reflux disease without esophagitis: Secondary | ICD-10-CM

## 2022-07-06 DIAGNOSIS — I25118 Atherosclerotic heart disease of native coronary artery with other forms of angina pectoris: Secondary | ICD-10-CM | POA: Diagnosis not present

## 2022-07-06 DIAGNOSIS — I10 Essential (primary) hypertension: Secondary | ICD-10-CM

## 2022-07-06 DIAGNOSIS — E118 Type 2 diabetes mellitus with unspecified complications: Secondary | ICD-10-CM | POA: Diagnosis not present

## 2022-07-06 MED ORDER — FAMOTIDINE 40 MG PO TABS: 40.0000 mg | ORAL_TABLET | Freq: Every day | ORAL | 3 refills | Status: DC

## 2022-07-06 NOTE — Assessment & Plan Note (Addendum)
Atypical CP - ?GERD. Less likely MSK vs other Re-start Famotidine

## 2022-07-06 NOTE — Assessment & Plan Note (Signed)
Discussed  Pt loves sweets and tends to overeat. The pt has not started Rubelsus and Repaglinide yet 2/23 Pt states BS drops too low on Repaglinide 0.5 mg - d/c Start Ozempic

## 2022-07-06 NOTE — Progress Notes (Signed)
Subjective:  Patient ID: Ryan Ferguson, male    DOB: 12-Jul-1950  Age: 72 y.o. MRN: [id number]  CC: No chief complaint on file.   HPI TRAMOND SLINKER presents for DM, HTN, CAD C/o L CP all the time  Outpatient Medications Prior to Visit  Medication Sig Dispense Refill   amLODipine (NORVASC) 5 MG tablet Take 1 tablet (5 mg total) by mouth daily. 90 tablet 1   aspirin EC 81 MG tablet Take 1 tablet (81 mg total) by mouth daily. 90 tablet 3   cholecalciferol (VITAMIN D) 1000 UNITS tablet Take 1,000 Units by mouth daily.     cyclobenzaprine (FLEXERIL) 10 MG tablet Take 1 tablet (10 mg total) by mouth 2 (two) times daily as needed for up to 12 doses for muscle spasms. 12 tablet 0   levothyroxine (SYNTHROID) 112 MCG tablet TAKE 1 TABLET BY MOUTH IN THE MORNING 90 tablet 2   metFORMIN (GLUCOPHAGE) 1000 MG tablet Take 1 tablet by mouth twice daily 180 tablet 3   nitroGLYCERIN (NITROSTAT) 0.4 MG SL tablet DISSOLVE ONE TABLET UNDER THE TONGUE EVERY 5 MINUTES AS NEEDED FOR CHEST PAIN.  DO NOT EXCEED A TOTAL OF 3 DOSES IN 15 MINUTES 50 tablet 0   olmesartan-hydrochlorothiazide (BENICAR HCT) 40-12.5 MG tablet Take 1 tablet by mouth once daily 90 tablet 3   ondansetron (ZOFRAN ODT) 4 MG disintegrating tablet Take 1 tablet (4 mg total) by mouth every 8 (eight) hours as needed for nausea or vomiting. 20 tablet 0   ONE TOUCH ULTRA TEST test strip USE TEST STRIPS TO TEST BLOOD SUGAR ONCE DAILY  4   Pitavastatin Calcium 1 MG TABS Take 1 tablet (1 mg total) by mouth daily. 30 tablet 11   Semaglutide,0.25 or 0.'5MG'$ /DOS, (OZEMPIC, 0.25 OR 0.5 MG/DOSE,) 2 MG/3ML SOPN Use 0.25 mg weekly sq for 1 month, then 0.5 mg sq weekly 3 mL 3   zolpidem (AMBIEN) 10 MG tablet Take 0.5-1 tablets (5-10 mg total) by mouth at bedtime as needed for sleep. 30 tablet 3   famotidine (PEPCID) 40 MG tablet Take 40 mg by mouth daily as needed for heartburn or indigestion.     oxyCODONE-acetaminophen (PERCOCET/ROXICET) 5-325 MG tablet  Take 1 tablet by mouth every 4 (four) hours as needed for severe pain. 20 tablet 0   No facility-administered medications prior to visit.    ROS: Review of Systems  Constitutional:  Negative for appetite change, fatigue and unexpected weight change.  HENT:  Negative for congestion, nosebleeds, sneezing, sore throat and trouble swallowing.   Eyes:  Negative for itching and visual disturbance.  Respiratory:  Negative for cough.   Cardiovascular:  Positive for chest pain. Negative for palpitations and leg swelling.  Gastrointestinal:  Negative for abdominal distention, blood in stool, diarrhea and nausea.  Genitourinary:  Negative for frequency and hematuria.  Musculoskeletal:  Negative for back pain, gait problem, joint swelling and neck pain.  Skin:  Negative for rash.  Neurological:  Negative for dizziness, tremors, speech difficulty and weakness.  Psychiatric/Behavioral:  Negative for agitation, dysphoric mood and sleep disturbance. The patient is not nervous/anxious.     Objective:  BP 124/78 (BP Location: Left Arm, Patient Position: Sitting, Cuff Size: Large)   Pulse 71   Temp 97.8 F (36.6 C) (Oral)   Ht '5\' 5"'$  (1.651 m)   Wt 151 lb (68.5 kg)   SpO2 93%   BMI 25.13 kg/m   BP Readings from Last 3 Encounters:  07/06/22 124/78  05/17/22 (!) 136/53  04/05/22 (!) 140/80    Wt Readings from Last 3 Encounters:  07/06/22 151 lb (68.5 kg)  05/17/22 150 lb 3.2 oz (68.1 kg)  04/05/22 149 lb 9.6 oz (67.9 kg)    Physical Exam Constitutional:      General: He is not in acute distress.    Appearance: Normal appearance. He is well-developed.     Comments: NAD  Eyes:     Conjunctiva/sclera: Conjunctivae normal.     Pupils: Pupils are equal, round, and reactive to light.  Neck:     Thyroid: No thyromegaly.     Vascular: No JVD.  Cardiovascular:     Rate and Rhythm: Normal rate and regular rhythm.     Heart sounds: Normal heart sounds. No murmur heard.    No friction rub. No  gallop.  Pulmonary:     Effort: Pulmonary effort is normal. No respiratory distress.     Breath sounds: Normal breath sounds. No wheezing or rales.  Chest:     Chest wall: No tenderness.  Abdominal:     General: Bowel sounds are normal. There is no distension.     Palpations: Abdomen is soft. There is no mass.     Tenderness: There is no abdominal tenderness. There is no guarding or rebound.  Musculoskeletal:        General: No tenderness. Normal range of motion.     Cervical back: Normal range of motion.  Lymphadenopathy:     Cervical: No cervical adenopathy.  Skin:    General: Skin is warm and dry.     Findings: No rash.  Neurological:     Mental Status: He is alert and oriented to person, place, and time.     Cranial Nerves: No cranial nerve deficit.     Motor: No abnormal muscle tone.     Coordination: Coordination normal.     Gait: Gait normal.     Deep Tendon Reflexes: Reflexes are normal and symmetric.  Psychiatric:        Behavior: Behavior normal.        Thought Content: Thought content normal.        Judgment: Judgment normal.     Lab Results  Component Value Date   WBC 5.2 01/08/2022   HGB 13.4 01/08/2022   HCT 39.4 01/08/2022   PLT 183 01/08/2022   GLUCOSE 228 (H) 01/08/2022   CHOL 143 08/07/2021   TRIG 117.0 08/07/2021   HDL 54.30 08/07/2021   LDLCALC 66 08/07/2021   ALT 15 01/08/2022   AST 13 (L) 01/08/2022   NA 136 01/08/2022   K 3.9 01/08/2022   CL 101 01/08/2022   CREATININE 1.13 01/08/2022   BUN 23 01/08/2022   CO2 24 01/08/2022   TSH 0.55 08/07/2021   PSA 2.17 03/26/2020   HGBA1C 7.6 (H) 11/09/2021   MICROALBUR 2.3 (H) 03/26/2020    MR ABDOMEN MRCP W WO CONTAST  Result Date: 02/22/2022 CLINICAL DATA:  Elevated lipase, family history of pancreatic cancer. EXAM: MRI ABDOMEN WITHOUT AND WITH CONTRAST (INCLUDING MRCP) TECHNIQUE: Multiplanar multisequence MR imaging of the abdomen was performed both before and after the administration of  intravenous contrast. Heavily T2-weighted images of the biliary and pancreatic ducts were obtained, and three-dimensional MRCP images were rendered by post processing. CONTRAST:  6.23m GADAVIST GADOBUTROL 1 MMOL/ML IV SOLN COMPARISON:  Multiple priors including most recent CT January 08, 2022 and MRI December 11, 2019. FINDINGS: Lower chest: No acute abnormality. Hepatobiliary: No significant  hepatic steatosis. Gallbladder is unremarkable. No biliary ductal dilation. Pancreas: No pancreatic ductal dilation. Intrinsic T1 signal of the pancreatic parenchyma is within normal limits. No cystic or solid hyperenhancing pancreatic lesion identified. Spleen:  Splenomegaly or focal splenic lesion. Adrenals/Urinary Tract: Bilateral adrenal glands appear normal. Bilateral fluid signal renal lesions of varying degrees of complexity measuring up to 4.8 cm on image 20/8, none of which demonstrate wall/septal thickening or enhancing nodularity, consistent with benign Bosniak category 1 and 2 renal cysts requiring no independent imaging follow-up. Stomach/Bowel: Moderate volume of formed stool throughout the colon. Stomach is unremarkable for degree of distension. No pathologic dilation or evidence of acute inflammation involving loops of large or small bowel in the abdomen. Vascular/Lymphatic: The portal, splenic and superior mesenteric veins are patent. Normal caliber abdominal aorta. Pathologically enlarged abdominal lymph nodes. Other:  None. Musculoskeletal: No suspicious bone lesions identified. IMPRESSION: 1. Normal MRI appearance of the pancreas without ductal dilation or ductal dilation and no cystic or solid hyperenhancing pancreatic lesion identified. 2. Bilateral benign Bosniak category 1 and 2 renal cysts requiring no independent imaging follow-up. Electronically Signed   By: Dahlia Bailiff M.D.   On: 02/22/2022 09:57   MR 3D Recon At Scanner  Result Date: 02/22/2022 CLINICAL DATA:  Elevated lipase, family history of  pancreatic cancer. EXAM: MRI ABDOMEN WITHOUT AND WITH CONTRAST (INCLUDING MRCP) TECHNIQUE: Multiplanar multisequence MR imaging of the abdomen was performed both before and after the administration of intravenous contrast. Heavily T2-weighted images of the biliary and pancreatic ducts were obtained, and three-dimensional MRCP images were rendered by post processing. CONTRAST:  6.39m GADAVIST GADOBUTROL 1 MMOL/ML IV SOLN COMPARISON:  Multiple priors including most recent CT January 08, 2022 and MRI December 11, 2019. FINDINGS: Lower chest: No acute abnormality. Hepatobiliary: No significant hepatic steatosis. Gallbladder is unremarkable. No biliary ductal dilation. Pancreas: No pancreatic ductal dilation. Intrinsic T1 signal of the pancreatic parenchyma is within normal limits. No cystic or solid hyperenhancing pancreatic lesion identified. Spleen:  Splenomegaly or focal splenic lesion. Adrenals/Urinary Tract: Bilateral adrenal glands appear normal. Bilateral fluid signal renal lesions of varying degrees of complexity measuring up to 4.8 cm on image 20/8, none of which demonstrate wall/septal thickening or enhancing nodularity, consistent with benign Bosniak category 1 and 2 renal cysts requiring no independent imaging follow-up. Stomach/Bowel: Moderate volume of formed stool throughout the colon. Stomach is unremarkable for degree of distension. No pathologic dilation or evidence of acute inflammation involving loops of large or small bowel in the abdomen. Vascular/Lymphatic: The portal, splenic and superior mesenteric veins are patent. Normal caliber abdominal aorta. Pathologically enlarged abdominal lymph nodes. Other:  None. Musculoskeletal: No suspicious bone lesions identified. IMPRESSION: 1. Normal MRI appearance of the pancreas without ductal dilation or ductal dilation and no cystic or solid hyperenhancing pancreatic lesion identified. 2. Bilateral benign Bosniak category 1 and 2 renal cysts requiring no  independent imaging follow-up. Electronically Signed   By: JDahlia BailiffM.D.   On: 02/22/2022 09:57    Assessment & Plan:   Problem List Items Addressed This Visit       Cardiovascular and Mediastinum   Hypertension    [facility] HCT      CAD (coronary artery disease), native coronary artery - Primary    Atypical CP - ?GERD. Less likely MSK vs other Re-start Famotidine        Digestive   GERD (gastroesophageal reflux disease)    Atypical CP - ?GERD. Less likely MSK vs other Re-start Famotidine  Relevant Medications   famotidine (PEPCID) 40 MG tablet     Endocrine   DM (diabetes mellitus), type 2 with complications ([place])    Discussed  Pt loves sweets and tends to overeat. The pt has not started Rubelsus and Repaglinide yet 2/23 Pt states BS drops too low on Repaglinide 0.5 mg - d/c Start Ozempic            Meds ordered this encounter  Medications   famotidine (PEPCID) 40 MG tablet    Sig: Take 1 tablet (40 mg total) by mouth daily.    Dispense:  90 tablet    Refill:  3      Follow-up: No follow-ups on file.  Walker Kehr, MD

## 2022-07-06 NOTE — Assessment & Plan Note (Signed)
[facility] HCT

## 2022-07-06 NOTE — Assessment & Plan Note (Signed)
Atypical CP - ?GERD. Less likely MSK vs other Re-start Famotidine

## 2022-07-14 DIAGNOSIS — R350 Frequency of micturition: Secondary | ICD-10-CM | POA: Diagnosis not present

## 2022-07-14 DIAGNOSIS — N401 Enlarged prostate with lower urinary tract symptoms: Secondary | ICD-10-CM | POA: Diagnosis not present

## 2022-07-14 DIAGNOSIS — N200 Calculus of kidney: Secondary | ICD-10-CM | POA: Diagnosis not present

## 2022-08-17 DIAGNOSIS — H52203 Unspecified astigmatism, bilateral: Secondary | ICD-10-CM | POA: Diagnosis not present

## 2022-08-17 DIAGNOSIS — E119 Type 2 diabetes mellitus without complications: Secondary | ICD-10-CM | POA: Diagnosis not present

## 2022-08-17 DIAGNOSIS — H11132 Conjunctival pigmentations, left eye: Secondary | ICD-10-CM | POA: Diagnosis not present

## 2022-08-17 DIAGNOSIS — H2513 Age-related nuclear cataract, bilateral: Secondary | ICD-10-CM | POA: Diagnosis not present

## 2022-08-17 LAB — HM DIABETES EYE EXAM

## 2022-09-06 ENCOUNTER — Encounter: Payer: Self-pay | Admitting: Internal Medicine

## 2022-09-06 ENCOUNTER — Ambulatory Visit (INDEPENDENT_AMBULATORY_CARE_PROVIDER_SITE_OTHER): Payer: PPO | Admitting: Internal Medicine

## 2022-09-06 VITALS — BP 150/80 | HR 55 | Temp 97.9°F | Ht 65.0 in | Wt 142.0 lb

## 2022-09-06 DIAGNOSIS — N182 Chronic kidney disease, stage 2 (mild): Secondary | ICD-10-CM

## 2022-09-06 DIAGNOSIS — B079 Viral wart, unspecified: Secondary | ICD-10-CM

## 2022-09-06 DIAGNOSIS — I10 Essential (primary) hypertension: Secondary | ICD-10-CM | POA: Diagnosis not present

## 2022-09-06 DIAGNOSIS — I25118 Atherosclerotic heart disease of native coronary artery with other forms of angina pectoris: Secondary | ICD-10-CM | POA: Diagnosis not present

## 2022-09-06 DIAGNOSIS — E7800 Pure hypercholesterolemia, unspecified: Secondary | ICD-10-CM

## 2022-09-06 DIAGNOSIS — E118 Type 2 diabetes mellitus with unspecified complications: Secondary | ICD-10-CM | POA: Diagnosis not present

## 2022-09-06 DIAGNOSIS — E0590 Thyrotoxicosis, unspecified without thyrotoxic crisis or storm: Secondary | ICD-10-CM

## 2022-09-06 LAB — LIPID PANEL
Cholesterol: 157 mg/dL (ref 0–200)
HDL: 48.9 mg/dL (ref >39.00)
LDL Cholesterol: 89 mg/dL (ref 0–99)
NonHDL: 107.96
Total CHOL/HDL Ratio: 3
Triglycerides: 93 mg/dL (ref 0.0–149.0)
VLDL: 18.6 mg/dL (ref 0.0–40.0)

## 2022-09-06 LAB — MICROALBUMIN / CREATININE URINE RATIO
Creatinine,U: 137.4 mg/dL
Microalb Creat Ratio: 1.8 mg/g (ref 0.0–30.0)
Microalb, Ur: 2.4 mg/dL — ABNORMAL HIGH (ref 0.0–1.9)

## 2022-09-06 MED ORDER — OLMESARTAN MEDOXOMIL 20 MG PO TABS: 20.0000 mg | ORAL_TABLET | Freq: Every day | ORAL | 3 refills | Status: DC

## 2022-09-06 NOTE — Assessment & Plan Note (Signed)
Doing well on Ozempic low dose

## 2022-09-06 NOTE — Assessment & Plan Note (Signed)
Hydrate better 

## 2022-09-06 NOTE — Assessment & Plan Note (Signed)
Cont w/[facility] HCT

## 2022-09-06 NOTE — Assessment & Plan Note (Signed)
Re-start Famotidine On Pitavastatin

## 2022-09-06 NOTE — Progress Notes (Signed)
Subjective:  Patient ID: Ryan Ferguson, male    DOB: 11/07/1950  Age: 72 y.o. MRN: [id number]  CC: Follow-up (2 MONTH F/U)   HPI LEMOND MCKINNEY presents for DM, CAD, HTN C/o a wart  Outpatient Medications Prior to Visit  Medication Sig Dispense Refill   amLODipine (NORVASC) 5 MG tablet Take 1 tablet (5 mg total) by mouth daily. 90 tablet 1   aspirin EC 81 MG tablet Take 1 tablet (81 mg total) by mouth daily. 90 tablet 3   cholecalciferol (VITAMIN D) 1000 UNITS tablet Take 1,000 Units by mouth daily.     cyclobenzaprine (FLEXERIL) 10 MG tablet Take 1 tablet (10 mg total) by mouth 2 (two) times daily as needed for up to 12 doses for muscle spasms. 12 tablet 0   famotidine (PEPCID) 40 MG tablet Take 1 tablet (40 mg total) by mouth daily. 90 tablet 3   levothyroxine (SYNTHROID) 112 MCG tablet TAKE 1 TABLET BY MOUTH IN THE MORNING 90 tablet 2   metFORMIN (GLUCOPHAGE) 1000 MG tablet Take 1 tablet by mouth twice daily 180 tablet 3   nitroGLYCERIN (NITROSTAT) 0.4 MG SL tablet DISSOLVE ONE TABLET UNDER THE TONGUE EVERY 5 MINUTES AS NEEDED FOR CHEST PAIN.  DO NOT EXCEED A TOTAL OF 3 DOSES IN 15 MINUTES 50 tablet 0   ondansetron (ZOFRAN ODT) 4 MG disintegrating tablet Take 1 tablet (4 mg total) by mouth every 8 (eight) hours as needed for nausea or vomiting. 20 tablet 0   ONE TOUCH ULTRA TEST test strip USE TEST STRIPS TO TEST BLOOD SUGAR ONCE DAILY  4   zolpidem (AMBIEN) 10 MG tablet Take 0.5-1 tablets (5-10 mg total) by mouth at bedtime as needed for sleep. 30 tablet 3   olmesartan-hydrochlorothiazide (BENICAR HCT) 40-12.5 MG tablet Take 1 tablet by mouth once daily 90 tablet 3   Pitavastatin Calcium 1 MG TABS Take 1 tablet (1 mg total) by mouth daily. 30 tablet 11   Semaglutide,0.25 or 0.5MG /DOS, (OZEMPIC, 0.25 OR 0.5 MG/DOSE,) 2 MG/3ML SOPN Use 0.25 mg weekly sq for 1 month, then 0.5 mg sq weekly (Patient not taking: Reported on 09/06/2022) 3 mL 3   No facility-administered medications  prior to visit.    ROS: Review of Systems  Constitutional:  Positive for unexpected weight change. Negative for appetite change and fatigue.  HENT:  Negative for congestion, nosebleeds, sneezing, sore throat and trouble swallowing.   Eyes:  Negative for itching and visual disturbance.  Respiratory:  Negative for cough.   Cardiovascular:  Negative for chest pain, palpitations and leg swelling.  Gastrointestinal:  Negative for abdominal distention, blood in stool, diarrhea and nausea.  Genitourinary:  Negative for frequency and hematuria.  Musculoskeletal:  Negative for back pain, gait problem, joint swelling and neck pain.  Skin:  Negative for rash.  Neurological:  Negative for dizziness, tremors, speech difficulty and weakness.  Psychiatric/Behavioral:  Negative for agitation, dysphoric mood and sleep disturbance. The patient is not nervous/anxious.     Objective:  BP (!) 150/80   Pulse (!) 55   Temp 97.9 F (36.6 C) (Oral)   Ht 5\' 5"  (1.651 m)   Wt 142 lb (64.4 kg)   SpO2 97%   BMI 23.63 kg/m   BP Readings from Last 3 Encounters:  09/06/22 (!) 150/80  07/06/22 124/78  05/17/22 (!) 136/53    Wt Readings from Last 3 Encounters:  09/06/22 142 lb (64.4 kg)  07/06/22 151 lb (68.5 kg)  05/17/22 150  lb 3.2 oz (68.1 kg)    Physical Exam Constitutional:      General: He is not in acute distress.    Appearance: He is well-developed. He is obese.     Comments: NAD  Eyes:     Conjunctiva/sclera: Conjunctivae normal.     Pupils: Pupils are equal, round, and reactive to light.  Neck:     Thyroid: No thyromegaly.     Vascular: No JVD.  Cardiovascular:     Rate and Rhythm: Normal rate and regular rhythm.     Heart sounds: Normal heart sounds. No murmur heard.    No friction rub. No gallop.  Pulmonary:     Effort: Pulmonary effort is normal. No respiratory distress.     Breath sounds: Normal breath sounds. No wheezing or rales.  Chest:     Chest wall: No tenderness.   Abdominal:     General: Bowel sounds are normal. There is no distension.     Palpations: Abdomen is soft. There is no mass.     Tenderness: There is no abdominal tenderness. There is no guarding or rebound.  Musculoskeletal:        General: No tenderness. Normal range of motion.     Cervical back: Normal range of motion.  Lymphadenopathy:     Cervical: No cervical adenopathy.  Skin:    General: Skin is warm and dry.     Findings: No rash.  Neurological:     Mental Status: He is alert and oriented to person, place, and time.     Cranial Nerves: No cranial nerve deficit.     Motor: No abnormal muscle tone.     Coordination: Coordination normal.     Gait: Gait normal.     Deep Tendon Reflexes: Reflexes are normal and symmetric.  Psychiatric:        Behavior: Behavior normal.        Thought Content: Thought content normal.        Judgment: Judgment normal.   Wart on back   Procedure Note :     Procedure : Cryosurgery   Indication:  Wart(s)   Risks including unsuccessful procedure , bleeding, infection, bruising, scar, a need for a repeat  procedure and others were explained to the patient in detail as well as the benefits. Informed consent was obtained verbally.   1  lesion(s)  on back   was/were treated with liquid nitrogen on a Q-tip in a usual fasion . Band-Aid was applied and antibiotic ointment was given for a later use.   Tolerated well. Complications none.   Postprocedure instructions :     Keep the wounds clean. You can wash them with liquid soap and water. Pat dry with gauze or a Kleenex tissue  Before applying antibiotic ointment and a Band-Aid.   You need to report immediately  if  any signs of infection develop.    Lab Results  Component Value Date   WBC 5.2 01/08/2022   HGB 13.4 01/08/2022   HCT 39.4 01/08/2022   PLT 183 01/08/2022   GLUCOSE 228 (H) 01/08/2022   CHOL 143 08/07/2021   TRIG 117.0 08/07/2021   HDL 54.30 08/07/2021   LDLCALC 66 08/07/2021    ALT 15 01/08/2022   AST 13 (L) 01/08/2022   NA 136 01/08/2022   K 3.9 01/08/2022   CL 101 01/08/2022   CREATININE 1.13 01/08/2022   BUN 23 01/08/2022   CO2 24 01/08/2022   TSH 0.55 08/07/2021  PSA 2.17 03/26/2020   HGBA1C 7.6 (H) 11/09/2021   MICROALBUR 2.3 (H) 03/26/2020    MR ABDOMEN MRCP W WO CONTAST  Result Date: 02/22/2022 CLINICAL DATA:  Elevated lipase, family history of pancreatic cancer. EXAM: MRI ABDOMEN WITHOUT AND WITH CONTRAST (INCLUDING MRCP) TECHNIQUE: Multiplanar multisequence MR imaging of the abdomen was performed both before and after the administration of intravenous contrast. Heavily T2-weighted images of the biliary and pancreatic ducts were obtained, and three-dimensional MRCP images were rendered by post processing. CONTRAST:  6.45mL GADAVIST GADOBUTROL 1 MMOL/ML IV SOLN COMPARISON:  Multiple priors including most recent CT January 08, 2022 and MRI December 11, 2019. FINDINGS: Lower chest: No acute abnormality. Hepatobiliary: No significant hepatic steatosis. Gallbladder is unremarkable. No biliary ductal dilation. Pancreas: No pancreatic ductal dilation. Intrinsic T1 signal of the pancreatic parenchyma is within normal limits. No cystic or solid hyperenhancing pancreatic lesion identified. Spleen:  Splenomegaly or focal splenic lesion. Adrenals/Urinary Tract: Bilateral adrenal glands appear normal. Bilateral fluid signal renal lesions of varying degrees of complexity measuring up to 4.8 cm on image 20/8, none of which demonstrate wall/septal thickening or enhancing nodularity, consistent with benign Bosniak category 1 and 2 renal cysts requiring no independent imaging follow-up. Stomach/Bowel: Moderate volume of formed stool throughout the colon. Stomach is unremarkable for degree of distension. No pathologic dilation or evidence of acute inflammation involving loops of large or small bowel in the abdomen. Vascular/Lymphatic: The portal, splenic and superior mesenteric veins are  patent. Normal caliber abdominal aorta. Pathologically enlarged abdominal lymph nodes. Other:  None. Musculoskeletal: No suspicious bone lesions identified. IMPRESSION: 1. Normal MRI appearance of the pancreas without ductal dilation or ductal dilation and no cystic or solid hyperenhancing pancreatic lesion identified. 2. Bilateral benign Bosniak category 1 and 2 renal cysts requiring no independent imaging follow-up. Electronically Signed   By: Dahlia Bailiff M.D.   On: 02/22/2022 09:57   MR 3D Recon At Scanner  Result Date: 02/22/2022 CLINICAL DATA:  Elevated lipase, family history of pancreatic cancer. EXAM: MRI ABDOMEN WITHOUT AND WITH CONTRAST (INCLUDING MRCP) TECHNIQUE: Multiplanar multisequence MR imaging of the abdomen was performed both before and after the administration of intravenous contrast. Heavily T2-weighted images of the biliary and pancreatic ducts were obtained, and three-dimensional MRCP images were rendered by post processing. CONTRAST:  6.72mL GADAVIST GADOBUTROL 1 MMOL/ML IV SOLN COMPARISON:  Multiple priors including most recent CT January 08, 2022 and MRI December 11, 2019. FINDINGS: Lower chest: No acute abnormality. Hepatobiliary: No significant hepatic steatosis. Gallbladder is unremarkable. No biliary ductal dilation. Pancreas: No pancreatic ductal dilation. Intrinsic T1 signal of the pancreatic parenchyma is within normal limits. No cystic or solid hyperenhancing pancreatic lesion identified. Spleen:  Splenomegaly or focal splenic lesion. Adrenals/Urinary Tract: Bilateral adrenal glands appear normal. Bilateral fluid signal renal lesions of varying degrees of complexity measuring up to 4.8 cm on image 20/8, none of which demonstrate wall/septal thickening or enhancing nodularity, consistent with benign Bosniak category 1 and 2 renal cysts requiring no independent imaging follow-up. Stomach/Bowel: Moderate volume of formed stool throughout the colon. Stomach is unremarkable for degree of  distension. No pathologic dilation or evidence of acute inflammation involving loops of large or small bowel in the abdomen. Vascular/Lymphatic: The portal, splenic and superior mesenteric veins are patent. Normal caliber abdominal aorta. Pathologically enlarged abdominal lymph nodes. Other:  None. Musculoskeletal: No suspicious bone lesions identified. IMPRESSION: 1. Normal MRI appearance of the pancreas without ductal dilation or ductal dilation and no cystic or solid hyperenhancing pancreatic  lesion identified. 2. Bilateral benign Bosniak category 1 and 2 renal cysts requiring no independent imaging follow-up. Electronically Signed   By: Dahlia Bailiff M.D.   On: 02/22/2022 09:57    Assessment & Plan:   Problem List Items Addressed This Visit       Cardiovascular and Mediastinum   Hypertension    Cont w/[facility] HCT      Relevant Medications   olmesartan (BENICAR) 20 MG tablet   CAD (coronary artery disease), native coronary artery    Re-start Famotidine On Pitavastatin      Relevant Medications   olmesartan (BENICAR) 20 MG tablet   Other Relevant Orders   Lipid panel     Endocrine   Hyperthyroidism - Primary   DM (diabetes mellitus), type 2 with complications ([place])    Doing well on Ozempic low dose       Relevant Medications   olmesartan (BENICAR) 20 MG tablet   Other Relevant Orders   Microalbumin / creatinine urine ratio     Genitourinary   CRI (chronic renal insufficiency), stage 2 (mild)    Hydrate better        Other   Pure hypercholesterolemia   Relevant Medications   olmesartan (BENICAR) 20 MG tablet   Other Relevant Orders   Lipid panel   Other Visit Diagnoses     Viral warts, unspecified type       Relevant Orders   Cryotherapy/destruct benign or premalignant lesion         Meds ordered this encounter  Medications   olmesartan (BENICAR) 20 MG tablet    Sig: Take 1 tablet (20 mg total) by mouth daily.    Dispense:  90 tablet    Refill:  3       Follow-up: Return in about 3 months (around 12/07/2022) for a follow-up visit.  Walker Kehr, MD

## 2022-09-23 ENCOUNTER — Other Ambulatory Visit: Payer: Self-pay | Admitting: Internal Medicine

## 2022-09-26 ENCOUNTER — Other Ambulatory Visit: Payer: Self-pay | Admitting: Internal Medicine

## 2022-10-01 ENCOUNTER — Other Ambulatory Visit: Payer: Self-pay | Admitting: Internal Medicine

## 2022-10-06 ENCOUNTER — Other Ambulatory Visit: Payer: Self-pay | Admitting: Internal Medicine

## 2022-10-08 ENCOUNTER — Other Ambulatory Visit: Payer: Self-pay | Admitting: *Deleted

## 2022-10-08 MED ORDER — OZEMPIC (0.25 OR 0.5 MG/DOSE) 2 MG/3ML ~~LOC~~ SOPN
PEN_INJECTOR | SUBCUTANEOUS | 3 refills | Status: DC
Start: 2022-10-08 — End: 2023-02-10

## 2022-10-11 ENCOUNTER — Other Ambulatory Visit: Payer: Self-pay | Admitting: Internal Medicine

## 2022-10-18 ENCOUNTER — Telehealth: Payer: Self-pay | Admitting: *Deleted

## 2022-10-18 NOTE — Telephone Encounter (Signed)
Rec'd fax needing PA on Ozempic. Submitted w/ (Key: [id number]). Waiting on insurance determination.Marland KitchenRaechel Chute

## 2022-10-19 ENCOUNTER — Telehealth: Payer: Self-pay | Admitting: Internal Medicine

## 2022-10-19 NOTE — Telephone Encounter (Signed)
Called patient to schedule Medicare Annual Wellness Visit (AWV). Left message for patient to call back and schedule Medicare Annual Wellness Visit (AWV).  Last date of AWV: 01/11/2022  Please schedule an appointment at any time with [facility].  If any questions, please contact me at [phone].  Thank you ,  Bernice Cicero Care Guide [facility] AWV TEAM Direct Dial: [phone]      

## 2022-10-19 NOTE — Telephone Encounter (Signed)
Rec'd determination med was Approved. Effective 29-APR-24:29-APR-25 Ozempic (0.25 or 0.5 MG/DOSE) 2MG /3ML. Faxed over approval to [facility].Marland KitchenRaechel Chute

## 2022-10-20 ENCOUNTER — Telehealth: Payer: Self-pay | Admitting: Internal Medicine

## 2022-10-20 NOTE — Telephone Encounter (Signed)
Contacted Ryan Ferguson to schedule their annual wellness visit. Appointment made for 11/03/2022.  [facility] Care Guide [facility] AWV TEAM Direct Dial: [phone]

## 2022-11-06 ENCOUNTER — Other Ambulatory Visit: Payer: Self-pay | Admitting: Internal Medicine

## 2022-11-17 ENCOUNTER — Ambulatory Visit (INDEPENDENT_AMBULATORY_CARE_PROVIDER_SITE_OTHER): Payer: PPO

## 2022-11-17 VITALS — BP 124/70 | HR 77 | Temp 97.7°F | Ht 65.0 in | Wt 146.4 lb

## 2022-11-17 DIAGNOSIS — Z0000 Encounter for general adult medical examination without abnormal findings: Secondary | ICD-10-CM | POA: Diagnosis not present

## 2022-11-17 NOTE — Patient Instructions (Addendum)
Mr. Ryan Ferguson , Thank you for taking time to come for your Medicare Wellness Visit. I appreciate your ongoing commitment to your health goals. Please review the following plan we discussed and let me know if I can assist you in the future.   These are the goals we discussed:  Goals      CCM:  Monitor and Maintain HbA1c <8%     Diabetes: Goal Setting The 7 areas that you can work on to improve your diabetes are:  Healthy Eating - Decrease portions, eat at regular times, add whole grains,  fruits and vegetables.  Being Active - Increase activity by walking, swimming or biking, take the  stairs, park farther away.  Monitoring - Check blood sugar, record results, keep a journal. Monitoring  can also include weight and blood pressure.  Taking Medicines - Take the right amount at the right time, learn how your  medicine works and what the side effects are.  Problem Solving - Take care of high and low blood sugars, sick days, know  who to call and when.  Reducing Risks - Stop smoking and start preventive care: get dilated eye  exams, foot care, flu and pneumonia shots, and dental care.  Healthy Coping - Know when to ask for help and who you can talk to when you feel stressed or overwhelmed. Pick something important to you and break your larger goals down into small  steps that you can really achieve! Caring for diabetes takes time and commitment, but it's worth it! Keep in mind  that change takes time. Once you choose a goal, break down the steps you will  take this week to reach that goal. Think of these as experiments, or things you  will try to see if they work. Think about why and what you will do differently  next week if something didn't work. Be patient with yourself and take it one day at a time.        This is a list of the screening recommended for you and due dates:  Health Maintenance  Topic Date Due   Complete foot exam   Never done   Hepatitis C Screening  Never done    Colon Cancer Screening  Never done   Pneumonia Vaccine (1 of 1 - PCV) Never done   COVID-19 Vaccine (4 - 2023-24 season) 02/19/2022   Hemoglobin A1C  05/12/2022   Yearly kidney function blood test for diabetes  01/09/2023   DTaP/Tdap/Td vaccine (3 - Td or Tdap) 07/10/2023   Eye exam for diabetics  08/18/2023   Yearly kidney health urinalysis for diabetes  09/06/2023   Medicare Annual Wellness Visit  11/17/2023   HPV Vaccine  Aged Out   Flu Shot  Discontinued   Zoster (Shingles) Vaccine  Discontinued    Advanced directives: No  Conditions/risks identified: Yes; Type II Diabetes  Next appointment: Follow up in one year for your annual wellness visit.   Preventive Care 72 Years and Older, Male  Preventive care refers to lifestyle choices and visits with your health care provider that can promote health and wellness. What does preventive care include? A yearly physical exam. This is also called an annual well check. Dental exams once or twice a year. Routine eye exams. Ask your health care provider how often you should have your eyes checked. Personal lifestyle choices, including: Daily care of your teeth and gums. Regular physical activity. Eating a healthy diet. Avoiding tobacco and drug use. Limiting alcohol use.  Practicing safe sex. Taking low doses of aspirin every day. Taking vitamin and mineral supplements as recommended by your health care provider. What happens during an annual well check? The services and screenings done by your health care provider during your annual well check will depend on your age, overall health, lifestyle risk factors, and family history of disease. Counseling  Your health care provider may ask you questions about your: Alcohol use. Tobacco use. Drug use. Emotional well-being. Home and relationship well-being. Sexual activity. Eating habits. History of falls. Memory and ability to understand (cognition). Work and work  Astronomer. Screening  You may have the following tests or measurements: Height, weight, and BMI. Blood pressure. Lipid and cholesterol levels. These may be checked every 5 years, or more frequently if you are over 72 years old. Skin check. Lung cancer screening. You may have this screening every year starting at age 72 if you have a 30-pack-year history of smoking and currently smoke or have quit within the past 15 years. Fecal occult blood test (FOBT) of the stool. You may have this test every year starting at age 72. Flexible sigmoidoscopy or colonoscopy. You may have a sigmoidoscopy every 5 years or a colonoscopy every 10 years starting at age 72. Prostate cancer screening. Recommendations will vary depending on your family history and other risks. Hepatitis C blood test. Hepatitis B blood test. Sexually transmitted disease (STD) testing. Diabetes screening. This is done by checking your blood sugar (glucose) after you have not eaten for a while (fasting). You may have this done every 1-3 years. Abdominal aortic aneurysm (AAA) screening. You may need this if you are a current or former smoker. Osteoporosis. You may be screened starting at age 72 if you are at high risk. Talk with your health care provider about your test results, treatment options, and if necessary, the need for more tests. Vaccines  Your health care provider may recommend certain vaccines, such as: Influenza vaccine. This is recommended every year. Tetanus, diphtheria, and acellular pertussis (Tdap, Td) vaccine. You may need a Td booster every 10 years. Zoster vaccine. You may need this after age 72. Pneumococcal 13-valent conjugate (PCV13) vaccine. One dose is recommended after age 72. Pneumococcal polysaccharide (PPSV23) vaccine. One dose is recommended after age 72. Talk to your health care provider about which screenings and vaccines you need and how often you need them. This information is not intended to replace  advice given to you by your health care provider. Make sure you discuss any questions you have with your health care provider. Document Released: 07/04/2015 Document Revised: 02/25/2016 Document Reviewed: 04/08/2015 Elsevier Interactive Patient Education  2017 [employer].  Fall Prevention in the Home Falls can cause injuries. They can happen to people of all ages. There are many things you can do to make your home safe and to help prevent falls. What can I do on the outside of my home? Regularly fix the edges of walkways and driveways and fix any cracks. Remove anything that might make you trip as you walk through a door, such as a raised step or threshold. Trim any bushes or trees on the path to your home. Use bright outdoor lighting. Clear any walking paths of anything that might make someone trip, such as rocks or tools. Regularly check to see if handrails are loose or broken. Make sure that both sides of any steps have handrails. Any raised decks and porches should have guardrails on the edges. Have any leaves, snow, or ice  cleared regularly. Use sand or salt on walking paths during winter. Clean up any spills in your garage right away. This includes oil or grease spills. What can I do in the bathroom? Use night lights. Install grab bars by the toilet and in the tub and shower. Do not use towel bars as grab bars. Use non-skid mats or decals in the tub or shower. If you need to sit down in the shower, use a plastic, non-slip stool. Keep the floor dry. Clean up any water that spills on the floor as soon as it happens. Remove soap buildup in the tub or shower regularly. Attach bath mats securely with double-sided non-slip rug tape. Do not have throw rugs and other things on the floor that can make you trip. What can I do in the bedroom? Use night lights. Make sure that you have a light by your bed that is easy to reach. Do not use any sheets or blankets that are too big for your bed.  They should not hang down onto the floor. Have a firm chair that has side arms. You can use this for support while you get dressed. Do not have throw rugs and other things on the floor that can make you trip. What can I do in the kitchen? Clean up any spills right away. Avoid walking on wet floors. Keep items that you use a lot in easy-to-reach places. If you need to reach something above you, use a strong step stool that has a grab bar. Keep electrical cords out of the way. Do not use floor polish or wax that makes floors slippery. If you must use wax, use non-skid floor wax. Do not have throw rugs and other things on the floor that can make you trip. What can I do with my stairs? Do not leave any items on the stairs. Make sure that there are handrails on both sides of the stairs and use them. Fix handrails that are broken or loose. Make sure that handrails are as long as the stairways. Check any carpeting to make sure that it is firmly attached to the stairs. Fix any carpet that is loose or worn. Avoid having throw rugs at the top or bottom of the stairs. If you do have throw rugs, attach them to the floor with carpet tape. Make sure that you have a light switch at the top of the stairs and the bottom of the stairs. If you do not have them, ask someone to add them for you. What else can I do to help prevent falls? Wear shoes that: Do not have high heels. Have rubber bottoms. Are comfortable and fit you well. Are closed at the toe. Do not wear sandals. If you use a stepladder: Make sure that it is fully opened. Do not climb a closed stepladder. Make sure that both sides of the stepladder are locked into place. Ask someone to hold it for you, if possible. Clearly mark and make sure that you can see: Any grab bars or handrails. First and last steps. Where the edge of each step is. Use tools that help you move around (mobility aids) if they are needed. These  include: Canes. Walkers. Scooters. Crutches. Turn on the lights when you go into a dark area. Replace any light bulbs as soon as they burn out. Set up your furniture so you have a clear path. Avoid moving your furniture around. If any of your floors are uneven, fix them. If there are any pets  around you, be aware of where they are. Review your medicines with your doctor. Some medicines can make you feel dizzy. This can increase your chance of falling. Ask your doctor what other things that you can do to help prevent falls. This information is not intended to replace advice given to you by your health care provider. Make sure you discuss any questions you have with your health care provider. Document Released: 04/03/2009 Document Revised: 11/13/2015 Document Reviewed: 07/12/2014 Elsevier Interactive Patient Education  2017 [employer].

## 2022-11-17 NOTE — Progress Notes (Signed)
Subjective:   Ryan Ferguson is a 72 y.o. male who presents for Medicare Annual/Subsequent preventive examination.  Review of Systems     Cardiac Risk Factors include: advanced age (>75men, >25 women);family history of premature cardiovascular disease;diabetes mellitus;dyslipidemia;hypertension;male gender     Objective:    Today's Vitals   11/17/22 0825  BP: 124/70  Pulse: 77  Temp: 97.7 F (36.5 C)  TempSrc: Temporal  SpO2: 97%  Weight: 146 lb 6.4 oz (66.4 kg)  Height: 5\' 5"  (1.651 m)  PainSc: 3   PainLoc: Abdomen   Body mass index is 24.36 kg/m.     11/17/2022    8:46 AM 01/11/2022   11:06 AM 01/08/2022    9:06 AM 01/09/2021    9:02 AM 05/07/2020    3:10 PM 11/08/2019    8:02 AM 05/08/2019    4:04 PM  Advanced Directives  Does Patient Have a Medical Advance Directive? No Yes No No No No No  Type of Advance Directive  Living will;Healthcare Power of Attorney       Does patient want to make changes to medical advance directive?  No - Patient declined       Would patient like information on creating a medical advance directive? No - Patient declined   No - Patient declined No - Patient declined Yes (MAU/Ambulatory/Procedural Areas - Information given) No - Patient declined    Current Medications (verified) Outpatient Encounter Medications as of 11/17/2022  Medication Sig   amLODipine (NORVASC) 5 MG tablet Take 1 tablet (5 mg total) by mouth daily.   aspirin EC 81 MG tablet Take 1 tablet (81 mg total) by mouth daily.   cholecalciferol (VITAMIN D) 1000 UNITS tablet Take 1,000 Units by mouth daily.   cyclobenzaprine (FLEXERIL) 10 MG tablet Take 1 tablet (10 mg total) by mouth 2 (two) times daily as needed for up to 12 doses for muscle spasms.   famotidine (PEPCID) 40 MG tablet Take 1 tablet (40 mg total) by mouth daily.   levothyroxine (SYNTHROID) 112 MCG tablet TAKE 1 TABLET BY MOUTH IN THE MORNING   metFORMIN (GLUCOPHAGE) 1000 MG tablet Take 1 tablet by mouth twice  daily   nitroGLYCERIN (NITROSTAT) 0.4 MG SL tablet DISSOLVE ONE TABLET UNDER THE TONGUE EVERY 5 MINUTES AS NEEDED FOR CHEST PAIN.  DO NOT EXCEED A TOTAL OF 3 DOSES IN 15 MINUTES   olmesartan (BENICAR) 20 MG tablet Take 1 tablet (20 mg total) by mouth daily.   ondansetron (ZOFRAN ODT) 4 MG disintegrating tablet Take 1 tablet (4 mg total) by mouth every 8 (eight) hours as needed for nausea or vomiting.   ONE TOUCH ULTRA TEST test strip USE TEST STRIPS TO TEST BLOOD SUGAR ONCE DAILY   Semaglutide,0.25 or 0.5MG /DOS, (OZEMPIC, 0.25 OR 0.5 MG/DOSE,) 2 MG/3ML SOPN Use 0.25 mg weekly sq for 1 month, then 0.5 mg sq weekly   tamsulosin (FLOMAX) 0.4 MG CAPS capsule Take 1 capsule by mouth once daily   zolpidem (AMBIEN) 10 MG tablet Take 0.5-1 tablets (5-10 mg total) by mouth at bedtime as needed for sleep.   No facility-administered encounter medications on file as of 11/17/2022.    Allergies (verified) Crestor [rosuvastatin], Hydrochlorothiazide, Lipitor [atorvastatin], Lisinopril, and Pravachol [pravastatin]   History: Past Medical History:  Diagnosis Date   Chronic kidney disease    Colon polyps    Diabetes mellitus without complication (HCC)    does not check cbg daily   Hypertension    Hyperthyroidism  Kidney stones    Past Surgical History:  Procedure Laterality Date   APPENDECTOMY     age 15   CORONARY STENT INTERVENTION N/A 01/09/2021   Procedure: CORONARY STENT INTERVENTION;  Surgeon: Lennette Bihari, MD;  Location: [facility];  Service: Cardiovascular;  Laterality: N/A;   CYSTOSCOPY     LEFT HEART CATH AND CORONARY ANGIOGRAPHY N/A 01/09/2021   Procedure: LEFT HEART CATH AND CORONARY ANGIOGRAPHY;  Surgeon: Lennette Bihari, MD;  Location: [facility];  Service: Cardiovascular;  Laterality: N/A;   LITHOTRIPSY     4-5 times   Family History  Problem Relation Age of Onset   Heart disease Mother    Thyroid disease Mother    High Cholesterol Father    Hypertension  Father    Varicose Veins Father    Pancreatic cancer Father    Kidney disease Father    Lupus Sister    Diabetes Maternal Grandmother    Heart disease Maternal Grandfather    Lung cancer Maternal Grandfather    Heart disease Paternal Grandmother    Colon cancer Neg Hx    Esophageal cancer Neg Hx    Stomach cancer Neg Hx    Liver disease Neg Hx    Social History   Socioeconomic History   Marital status: Married    Spouse name: Not on file   Number of children: 3   Years of education: Not on file   Highest education level: Not on file  Occupational History   Not on file  Tobacco Use   Smoking status: Former    Packs/day: 1    Types: Cigarettes    Quit date: 10/25/2010    Years since quitting: 12.0   Smokeless tobacco: Never  Vaping Use   Vaping Use: Never used  Substance and Sexual Activity   Alcohol use: Yes    Comment: occasional   Drug use: No   Sexual activity: Not on file  Other Topics Concern   Not on file  Social History Narrative   Not on file   Social Determinants of Health   Financial Resource Strain: Low Risk  (11/17/2022)   Overall Financial Resource Strain (CARDIA)    Difficulty of Paying Living Expenses: Not hard at all  Food Insecurity: No Food Insecurity (11/17/2022)   Hunger Vital Sign    Worried About Running Out of Food in the Last Year: Never true    Ran Out of Food in the Last Year: Never true  Transportation Needs: No Transportation Needs (11/17/2022)   PRAPARE - Administrator, Civil Service (Medical): No    Lack of Transportation (Non-Medical): No  Physical Activity: Sufficiently Active (11/17/2022)   Exercise Vital Sign    Days of Exercise per Week: 7 days    Minutes of Exercise per Session: 30 min  Stress: No Stress Concern Present (11/17/2022)   [employer] of Occupational Health - Occupational Stress Questionnaire    Feeling of Stress : Not at all  Social Connections: Moderately Isolated (11/17/2022)   Social  Connection and Isolation Panel [NHANES]    Frequency of Communication with Friends and Family: More than three times a week    Frequency of Social Gatherings with Friends and Family: More than three times a week    Attends Religious Services: Never    Database administrator or Organizations: No    Attends Banker Meetings: Never    Marital Status: Married  Tobacco Counseling Counseling given: Not Answered   Clinical Intake:  Pre-visit preparation completed: Yes  Pain : No/denies pain Pain Score: 3      BMI - recorded: 24.63 Nutritional Status: BMI of 19-24  Normal Nutritional Risks: None Diabetes: Yes CBG done?: No Did pt. bring in CBG monitor from home?: No  How often do you need to have someone help you when you read instructions, pamphlets, or other written materials from your doctor or pharmacy?: 1 - Never What is the last grade level you completed in school?: HSG  Nutrition Risk Assessment:  Has the patient had any N/V/D within the last 2 months?  No  Does the patient have any non-healing wounds?  No  Has the patient had any unintentional weight loss or weight gain?  No   Diabetes:  Is the patient diabetic?  Yes  If diabetic, was a CBG obtained today?  No  Did the patient bring in their glucometer from home?  No  How often do you monitor your CBG's? Once daily.   Financial Strains and Diabetes Management:  Are you having any financial strains with the device, your supplies or your medication? No .  Does the patient want to be seen by Chronic Care Management for management of their diabetes?  No  Would the patient like to be referred to a Nutritionist or for Diabetic Management?  No   Diabetic Exams:  Diabetic Eye Exam: Completed 08/17/2022 Diabetic Foot Exam: Overdue, Pt has been advised about the importance in completing this exam. Pt is scheduled for diabetic foot exam on 12/07/2022.   Interpreter Needed?: No  Information entered by ::  Otha Rickles N. Folasade Mooty, LPN.   Activities of Daily Living    11/17/2022    8:29 AM 01/11/2022   11:16 AM  In your present state of health, do you have any difficulty performing the following activities:  Hearing? 0 0  Vision? 0 0  Difficulty concentrating or making decisions? 0 0  Walking or climbing stairs? 0 0  Dressing or bathing? 0 0  Doing errands, shopping? 0 0  Preparing Food and eating ? N N  Using the Toilet? N N  In the past six months, have you accidently leaked urine? N N  Do you have problems with loss of bowel control? N N  Managing your Medications? N N  Managing your Finances? N N  Housekeeping or managing your Housekeeping? N N    Patient Care Team: Plotnikov, Georgina Quint, MD as PCP - General (Internal Medicine) Jake Bathe, MD as PCP - Cardiology (Cardiology) Marcine Matar, MD as Consulting Physician (Urology) Charlott Rakes, MD as Consulting Physician (Gastroenterology) Talmage Coin, MD as Consulting Physician (Endocrinology) Wendall Stade, MD as Consulting Physician (Cardiology) [facility], [facility] as Consulting Physician (Ophthalmology)  Indicate any recent Medical Services you may have received from other than [facility] providers in the past year (date may be approximate).     Assessment:   This is a routine wellness examination for Stony.  Hearing/Vision screen Hearing Screening - Comments:: Denies hearing difficulties; no hearing aids.   Vision Screening - Comments:: Wears rx glasses - up to date with routine eye exams with [facility]   Dietary issues and exercise activities discussed: Current Exercise Habits: Home exercise routine, Type of exercise: walking, Time (Minutes): 30, Frequency (Times/Week): 7, Weekly Exercise (Minutes/Week): 210, Intensity: Moderate, Exercise limited by: None identified   Goals Addressed  This Visit's Progress    CCM:  Monitor and Maintain HbA1c <8%       Diabetes:  Goal Setting The 7 areas that you can work on to improve your diabetes are:  Healthy Eating - Decrease portions, eat at regular times, add whole grains,  fruits and vegetables.  Being Active - Increase activity by walking, swimming or biking, take the  stairs, park farther away.  Monitoring - Check blood sugar, record results, keep a journal. Monitoring  can also include weight and blood pressure.  Taking Medicines - Take the right amount at the right time, learn how your  medicine works and what the side effects are.  Problem Solving - Take care of high and low blood sugars, sick days, know  who to call and when.  Reducing Risks - Stop smoking and start preventive care: get dilated eye  exams, foot care, flu and pneumonia shots, and dental care.  Healthy Coping - Know when to ask for help and who you can talk to when you feel stressed or overwhelmed. Pick something important to you and break your larger goals down into small  steps that you can really achieve! Caring for diabetes takes time and commitment, but it's worth it! Keep in mind  that change takes time. Once you choose a goal, break down the steps you will  take this week to reach that goal. Think of these as experiments, or things you  will try to see if they work. Think about why and what you will do differently  next week if something didn't work. Be patient with yourself and take it one day at a time.      Depression Screen    11/17/2022    8:48 AM 09/06/2022    8:44 AM 07/06/2022    8:28 AM 01/11/2022   11:12 AM 06/04/2021    8:45 AM 05/07/2020    3:08 PM 11/13/2019    3:18 PM  PHQ 2/9 Scores  PHQ - 2 Score 0 0 0 0 0 0 0  PHQ- 9 Score 0    0      Fall Risk    11/17/2022    8:48 AM 09/06/2022    8:44 AM 07/06/2022    8:27 AM 01/11/2022   11:07 AM 09/08/2021    8:09 AM  Fall Risk   Falls in the past year? 0 0 0 0 0  Number falls in past yr: 0 0 0 0 0  Injury with Fall? 0 0 0 0 0  Risk for fall due to : No Fall  Risks No Fall Risks No Fall Risks No Fall Risks   Follow up Falls prevention discussed Falls evaluation completed Falls evaluation completed Falls evaluation completed     FALL RISK PREVENTION PERTAINING TO THE HOME:  Any stairs in or around the home? Yes  If so, are there any without handrails? No  Home free of loose throw rugs in walkways, pet beds, electrical cords, etc? Yes  Adequate lighting in your home to reduce risk of falls? Yes   ASSISTIVE DEVICES UTILIZED TO PREVENT FALLS:  Life alert? No  Use of a cane, walker or w/c? No  Grab bars in the bathroom? No  Shower chair or bench in shower? No  Elevated toilet seat or a handicapped toilet? No   TIMED UP AND GO:  Was the test performed? Yes .  Length of time to ambulate 10 feet: 8 sec.   Gait steady and fast  without use of assistive device  Cognitive Function:        11/17/2022    8:29 AM 01/11/2022   11:18 AM  6CIT Screen  What Year? 0 points 0 points  What month? 0 points 0 points  What time? 0 points 0 points  Count back from 20 0 points 0 points  Months in reverse 0 points 0 points  Repeat phrase 0 points 0 points  Total Score 0 points 0 points    Immunizations Immunization History  Administered Date(s) Administered   PFIZER(Purple Top)SARS-COV-2 Vaccination 08/20/2019, 09/18/2019, 04/19/2020   Tdap 11/05/2008, 07/09/2013   Zoster, Live 07/10/2013    TDAP status: Up to date  Flu Vaccine status: Declined, Education has been provided regarding the importance of this vaccine but patient still declined. Advised may receive this vaccine at local pharmacy or Health Dept. Aware to provide a copy of the vaccination record if obtained from local pharmacy or Health Dept. Verbalized acceptance and understanding.  Pneumococcal vaccine status: Declined,  Education has been provided regarding the importance of this vaccine but patient still declined. Advised may receive this vaccine at local pharmacy or Health Dept.  Aware to provide a copy of the vaccination record if obtained from local pharmacy or Health Dept. Verbalized acceptance and understanding.   Covid-19 vaccine status: Completed vaccines  Qualifies for Shingles Vaccine? Yes   Zostavax completed No   Shingrix Completed?: No.    Education has been provided regarding the importance of this vaccine. Patient has been advised to call insurance company to determine out of pocket expense if they have not yet received this vaccine. Advised may also receive vaccine at local pharmacy or Health Dept. Verbalized acceptance and understanding.  Screening Tests Health Maintenance  Topic Date Due   FOOT EXAM  Never done   Hepatitis C Screening  Never done   Colonoscopy  Never done   Pneumonia Vaccine 75+ Years old (1 of 1 - PCV) Never done   COVID-19 Vaccine (4 - 2023-24 season) 02/19/2022   HEMOGLOBIN A1C  05/12/2022   Diabetic kidney evaluation - eGFR measurement  01/09/2023   DTaP/Tdap/Td (3 - Td or Tdap) 07/10/2023   OPHTHALMOLOGY EXAM  08/18/2023   Diabetic kidney evaluation - Urine ACR  09/06/2023   Medicare Annual Wellness (AWV)  11/17/2023   HPV VACCINES  Aged Out   INFLUENZA VACCINE  Discontinued   Zoster Vaccines- Shingrix  Discontinued    Health Maintenance  Health Maintenance Due  Topic Date Due   FOOT EXAM  Never done   Hepatitis C Screening  Never done   Colonoscopy  Never done   Pneumonia Vaccine 19+ Years old (1 of 1 - PCV) Never done   COVID-19 Vaccine (4 - 2023-24 season) 02/19/2022   HEMOGLOBIN A1C  05/12/2022    Colorectal cancer screening: Type of screening: Colonoscopy. Completed 09/09/2016. Repeat every 10 years  Lung Cancer Screening: (Low Dose CT Chest recommended if Age 30-80 years, 30 pack-year currently smoking OR have quit w/in 15years.) does not qualify.   Lung Cancer Screening Referral: NO  Additional Screening:  Hepatitis C Screening: does qualify; Completed: no  Vision Screening: Recommended annual  ophthalmology exams for early detection of glaucoma and other disorders of the eye. Is the patient up to date with their annual eye exam?  Yes  Who is the provider or what is the name of the office in which the patient attends annual eye exams? [facility] If pt is not established with a  provider, would they like to be referred to a provider to establish care? No .   Dental Screening: Recommended annual dental exams for proper oral hygiene  Community Resource Referral / Chronic Care Management: CRR required this visit?  No   CCM required this visit?  No      Plan:     I have personally reviewed and noted the following in the patient's chart:   Medical and social history Use of alcohol, tobacco or illicit drugs  Current medications and supplements including opioid prescriptions. Patient is not currently taking opioid prescriptions. Functional ability and status Nutritional status Physical activity Advanced directives List of other physicians Hospitalizations, surgeries, and ER visits in previous 12 months Vitals Screenings to include cognitive, depression, and falls Referrals and appointments  In addition, I have reviewed and discussed with patient certain preventive protocols, quality metrics, and best practice recommendations. A written personalized care plan for preventive services as well as general preventive health recommendations were provided to patient.     Mickeal Needy, LPN   [id number]   Nurse Notes: Normal cognitive status assessed by direct observation by this Nurse Health Advisor. No abnormalities found.

## 2022-12-07 ENCOUNTER — Ambulatory Visit (INDEPENDENT_AMBULATORY_CARE_PROVIDER_SITE_OTHER): Payer: PPO | Admitting: Internal Medicine

## 2022-12-07 ENCOUNTER — Encounter: Payer: Self-pay | Admitting: Internal Medicine

## 2022-12-07 VITALS — BP 130/82 | HR 58 | Temp 98.3°F | Ht 65.0 in | Wt 146.0 lb

## 2022-12-07 DIAGNOSIS — G8929 Other chronic pain: Secondary | ICD-10-CM

## 2022-12-07 DIAGNOSIS — Z23 Encounter for immunization: Secondary | ICD-10-CM | POA: Diagnosis not present

## 2022-12-07 DIAGNOSIS — I10 Essential (primary) hypertension: Secondary | ICD-10-CM | POA: Diagnosis not present

## 2022-12-07 DIAGNOSIS — M5442 Lumbago with sciatica, left side: Secondary | ICD-10-CM | POA: Diagnosis not present

## 2022-12-07 DIAGNOSIS — I209 Angina pectoris, unspecified: Secondary | ICD-10-CM

## 2022-12-07 DIAGNOSIS — R748 Abnormal levels of other serum enzymes: Secondary | ICD-10-CM | POA: Diagnosis not present

## 2022-12-07 DIAGNOSIS — N182 Chronic kidney disease, stage 2 (mild): Secondary | ICD-10-CM | POA: Diagnosis not present

## 2022-12-07 DIAGNOSIS — E1159 Type 2 diabetes mellitus with other circulatory complications: Secondary | ICD-10-CM | POA: Diagnosis not present

## 2022-12-07 DIAGNOSIS — M545 Low back pain, unspecified: Secondary | ICD-10-CM | POA: Insufficient documentation

## 2022-12-07 DIAGNOSIS — M5441 Lumbago with sciatica, right side: Secondary | ICD-10-CM

## 2022-12-07 DIAGNOSIS — Z7984 Long term (current) use of oral hypoglycemic drugs: Secondary | ICD-10-CM

## 2022-12-07 DIAGNOSIS — E118 Type 2 diabetes mellitus with unspecified complications: Secondary | ICD-10-CM | POA: Diagnosis not present

## 2022-12-07 LAB — COMPREHENSIVE METABOLIC PANEL
ALT: 16 U/L (ref 0–53)
AST: 14 U/L (ref 0–37)
Albumin: 4.2 g/dL (ref 3.5–5.2)
Alkaline Phosphatase: 46 U/L (ref 39–117)
BUN: 21 mg/dL (ref 6–23)
CO2: 29 mEq/L (ref 19–32)
Calcium: 9.4 mg/dL (ref 8.4–10.5)
Chloride: 102 mEq/L (ref 96–112)
Creatinine, Ser: 1.08 mg/dL (ref 0.40–1.50)
GFR: 68.65 mL/min (ref >60.00)
Glucose, Bld: 176 mg/dL — ABNORMAL HIGH (ref 70–99)
Potassium: 4 mEq/L (ref 3.5–5.1)
Sodium: 138 mEq/L (ref 135–145)
Total Bilirubin: 0.3 mg/dL (ref 0.2–1.2)
Total Protein: 7.1 g/dL (ref 6.0–8.3)

## 2022-12-07 LAB — CBC WITH DIFFERENTIAL/PLATELET
Basophils Absolute: 0 10*3/uL (ref 0.0–0.1)
Basophils Relative: 0.6 % (ref 0.0–3.0)
Eosinophils Absolute: 0.2 10*3/uL (ref 0.0–0.7)
Eosinophils Relative: 3.7 % (ref 0.0–5.0)
HCT: 40.3 % (ref 39.0–52.0)
Hemoglobin: 13.3 g/dL (ref 13.0–17.0)
Lymphocytes Relative: 43.7 % (ref 12.0–46.0)
Lymphs Abs: 2.3 10*3/uL (ref 0.7–4.0)
MCHC: 33.1 g/dL (ref 30.0–36.0)
MCV: 93 fl (ref 78.0–100.0)
Monocytes Absolute: 0.4 10*3/uL (ref 0.1–1.0)
Monocytes Relative: 7.6 % (ref 3.0–12.0)
Neutro Abs: 2.3 10*3/uL (ref 1.4–7.7)
Neutrophils Relative %: 44.4 % (ref 43.0–77.0)
Platelets: 209 10*3/uL (ref 150.0–400.0)
RBC: 4.33 Mil/uL (ref 4.22–5.81)
RDW: 13.3 % (ref 11.5–15.5)
WBC: 5.3 10*3/uL (ref 4.0–10.5)

## 2022-12-07 LAB — HEMOGLOBIN A1C: Hgb A1c MFr Bld: 7.3 % — ABNORMAL HIGH (ref 4.6–6.5)

## 2022-12-07 LAB — LIPASE: Lipase: 115 U/L — ABNORMAL HIGH (ref 11.0–59.0)

## 2022-12-07 MED ORDER — CYCLOBENZAPRINE HCL 10 MG PO TABS
10.0000 mg | ORAL_TABLET | Freq: Every evening | ORAL | 0 refills | Status: DC | PRN
Start: 2022-12-07 — End: 2023-04-28

## 2022-12-07 NOTE — Addendum Note (Signed)
Addended by: Delsa Grana R on: 12/07/2022 09:28 AM   Modules accepted: Orders

## 2022-12-07 NOTE — Progress Notes (Signed)
Subjective:  Patient ID: Ryan Ferguson, male    DOB: Jun 16, 1951  Age: 72 y.o. MRN: [id number]  CC: Follow-up (3 MNTH F/U)   HPI Ryan Ferguson presents for LBP and L hip pain x weeks F/u DM - not using Ozempic  Outpatient Medications Prior to Visit  Medication Sig Dispense Refill   amLODipine (NORVASC) 5 MG tablet Take 1 tablet (5 mg total) by mouth daily. 90 tablet 1   aspirin EC 81 MG tablet Take 1 tablet (81 mg total) by mouth daily. 90 tablet 3   cholecalciferol (VITAMIN D) 1000 UNITS tablet Take 1,000 Units by mouth daily.     famotidine (PEPCID) 40 MG tablet Take 1 tablet (40 mg total) by mouth daily. 90 tablet 3   levothyroxine (SYNTHROID) 112 MCG tablet TAKE 1 TABLET BY MOUTH IN THE MORNING 90 tablet 2   metFORMIN (GLUCOPHAGE) 1000 MG tablet Take 1 tablet by mouth twice daily 180 tablet 0   nitroGLYCERIN (NITROSTAT) 0.4 MG SL tablet DISSOLVE ONE TABLET UNDER THE TONGUE EVERY 5 MINUTES AS NEEDED FOR CHEST PAIN.  DO NOT EXCEED A TOTAL OF 3 DOSES IN 15 MINUTES 50 tablet 0   olmesartan (BENICAR) 20 MG tablet Take 1 tablet (20 mg total) by mouth daily. 90 tablet 3   ondansetron (ZOFRAN ODT) 4 MG disintegrating tablet Take 1 tablet (4 mg total) by mouth every 8 (eight) hours as needed for nausea or vomiting. 20 tablet 0   ONE TOUCH ULTRA TEST test strip USE TEST STRIPS TO TEST BLOOD SUGAR ONCE DAILY  4   Semaglutide,0.25 or 0.5MG /DOS, (OZEMPIC, 0.25 OR 0.5 MG/DOSE,) 2 MG/3ML SOPN Use 0.25 mg weekly sq for 1 month, then 0.5 mg sq weekly 3 mL 3   tamsulosin (FLOMAX) 0.4 MG CAPS capsule Take 1 capsule by mouth once daily 30 capsule 5   zolpidem (AMBIEN) 10 MG tablet Take 0.5-1 tablets (5-10 mg total) by mouth at bedtime as needed for sleep. 30 tablet 3   cyclobenzaprine (FLEXERIL) 10 MG tablet Take 1 tablet (10 mg total) by mouth 2 (two) times daily as needed for up to 12 doses for muscle spasms. 12 tablet 0   No facility-administered medications prior to visit.    ROS: Review of  Systems  Constitutional:  Negative for appetite change, fatigue and unexpected weight change.  HENT:  Negative for congestion, nosebleeds, sneezing, sore throat and trouble swallowing.   Eyes:  Negative for itching and visual disturbance.  Respiratory:  Negative for cough.   Cardiovascular:  Negative for chest pain, palpitations and leg swelling.  Gastrointestinal:  Negative for abdominal distention, blood in stool, diarrhea and nausea.  Genitourinary:  Negative for frequency and hematuria.  Musculoskeletal:  Positive for back pain and gait problem. Negative for joint swelling and neck pain.  Skin:  Negative for rash.  Neurological:  Negative for dizziness, tremors, speech difficulty and weakness.  Hematological:  Does not bruise/bleed easily.  Psychiatric/Behavioral:  Negative for agitation, dysphoric mood and sleep disturbance. The patient is not nervous/anxious.     Objective:  BP 130/82 (BP Location: Right Arm, Patient Position: Sitting, Cuff Size: Large)   Pulse (!) 58   Temp 98.3 F (36.8 C) (Oral)   Ht 5\' 5"  (1.651 m)   Wt 146 lb (66.2 kg)   SpO2 90%   BMI 24.30 kg/m   BP Readings from Last 3 Encounters:  12/07/22 130/82  11/17/22 124/70  09/06/22 (!) 150/80    Wt Readings from Last  3 Encounters:  12/07/22 146 lb (66.2 kg)  11/17/22 146 lb 6.4 oz (66.4 kg)  09/06/22 142 lb (64.4 kg)    Physical Exam Constitutional:      General: He is not in acute distress.    Appearance: Normal appearance. He is well-developed.     Comments: NAD  Eyes:     Conjunctiva/sclera: Conjunctivae normal.     Pupils: Pupils are equal, round, and reactive to light.  Neck:     Thyroid: No thyromegaly.     Vascular: No JVD.  Cardiovascular:     Rate and Rhythm: Normal rate and regular rhythm.     Heart sounds: Normal heart sounds. No murmur heard.    No friction rub. No gallop.  Pulmonary:     Effort: Pulmonary effort is normal. No respiratory distress.     Breath sounds: Normal  breath sounds. No wheezing or rales.  Chest:     Chest wall: No tenderness.  Abdominal:     General: Bowel sounds are normal. There is no distension.     Palpations: Abdomen is soft. There is no mass.     Tenderness: There is no abdominal tenderness. There is no guarding or rebound.  Musculoskeletal:        General: No tenderness. Normal range of motion.     Cervical back: Normal range of motion.  Lymphadenopathy:     Cervical: No cervical adenopathy.  Skin:    General: Skin is warm and dry.     Findings: No rash.  Neurological:     Mental Status: He is alert and oriented to person, place, and time.     Cranial Nerves: No cranial nerve deficit.     Motor: No abnormal muscle tone.     Coordination: Coordination normal.     Gait: Gait normal.     Deep Tendon Reflexes: Reflexes are normal and symmetric.  Psychiatric:        Behavior: Behavior normal.        Thought Content: Thought content normal.        Judgment: Judgment normal.   R buttock - tender Str leg elev (-) B  Lab Results  Component Value Date   WBC 5.2 01/08/2022   HGB 13.4 01/08/2022   HCT 39.4 01/08/2022   PLT 183 01/08/2022   GLUCOSE 228 (H) 01/08/2022   CHOL 157 09/06/2022   TRIG 93.0 09/06/2022   HDL 48.90 09/06/2022   LDLCALC 89 09/06/2022   ALT 15 01/08/2022   AST 13 (L) 01/08/2022   NA 136 01/08/2022   K 3.9 01/08/2022   CL 101 01/08/2022   CREATININE 1.13 01/08/2022   BUN 23 01/08/2022   CO2 24 01/08/2022   TSH 0.55 08/07/2021   PSA 2.17 03/26/2020   HGBA1C 7.6 (H) 11/09/2021   MICROALBUR 2.4 (H) 09/06/2022    MR ABDOMEN MRCP W WO CONTAST  Result Date: 02/22/2022 CLINICAL DATA:  Elevated lipase, family history of pancreatic cancer. EXAM: MRI ABDOMEN WITHOUT AND WITH CONTRAST (INCLUDING MRCP) TECHNIQUE: Multiplanar multisequence MR imaging of the abdomen was performed both before and after the administration of intravenous contrast. Heavily T2-weighted images of the biliary and pancreatic ducts  were obtained, and three-dimensional MRCP images were rendered by post processing. CONTRAST:  6.21mL GADAVIST GADOBUTROL 1 MMOL/ML IV SOLN COMPARISON:  Multiple priors including most recent CT January 08, 2022 and MRI December 11, 2019. FINDINGS: Lower chest: No acute abnormality. Hepatobiliary: No significant hepatic steatosis. Gallbladder is unremarkable. No biliary ductal  dilation. Pancreas: No pancreatic ductal dilation. Intrinsic T1 signal of the pancreatic parenchyma is within normal limits. No cystic or solid hyperenhancing pancreatic lesion identified. Spleen:  Splenomegaly or focal splenic lesion. Adrenals/Urinary Tract: Bilateral adrenal glands appear normal. Bilateral fluid signal renal lesions of varying degrees of complexity measuring up to 4.8 cm on image 20/8, none of which demonstrate wall/septal thickening or enhancing nodularity, consistent with benign Bosniak category 1 and 2 renal cysts requiring no independent imaging follow-up. Stomach/Bowel: Moderate volume of formed stool throughout the colon. Stomach is unremarkable for degree of distension. No pathologic dilation or evidence of acute inflammation involving loops of large or small bowel in the abdomen. Vascular/Lymphatic: The portal, splenic and superior mesenteric veins are patent. Normal caliber abdominal aorta. Pathologically enlarged abdominal lymph nodes. Other:  None. Musculoskeletal: No suspicious bone lesions identified. IMPRESSION: 1. Normal MRI appearance of the pancreas without ductal dilation or ductal dilation and no cystic or solid hyperenhancing pancreatic lesion identified. 2. Bilateral benign Bosniak category 1 and 2 renal cysts requiring no independent imaging follow-up. Electronically Signed   By: Maudry Mayhew M.D.   On: 02/22/2022 09:57   MR 3D Recon At Scanner  Result Date: 02/22/2022 CLINICAL DATA:  Elevated lipase, family history of pancreatic cancer. EXAM: MRI ABDOMEN WITHOUT AND WITH CONTRAST (INCLUDING MRCP) TECHNIQUE:  Multiplanar multisequence MR imaging of the abdomen was performed both before and after the administration of intravenous contrast. Heavily T2-weighted images of the biliary and pancreatic ducts were obtained, and three-dimensional MRCP images were rendered by post processing. CONTRAST:  6.14mL GADAVIST GADOBUTROL 1 MMOL/ML IV SOLN COMPARISON:  Multiple priors including most recent CT January 08, 2022 and MRI December 11, 2019. FINDINGS: Lower chest: No acute abnormality. Hepatobiliary: No significant hepatic steatosis. Gallbladder is unremarkable. No biliary ductal dilation. Pancreas: No pancreatic ductal dilation. Intrinsic T1 signal of the pancreatic parenchyma is within normal limits. No cystic or solid hyperenhancing pancreatic lesion identified. Spleen:  Splenomegaly or focal splenic lesion. Adrenals/Urinary Tract: Bilateral adrenal glands appear normal. Bilateral fluid signal renal lesions of varying degrees of complexity measuring up to 4.8 cm on image 20/8, none of which demonstrate wall/septal thickening or enhancing nodularity, consistent with benign Bosniak category 1 and 2 renal cysts requiring no independent imaging follow-up. Stomach/Bowel: Moderate volume of formed stool throughout the colon. Stomach is unremarkable for degree of distension. No pathologic dilation or evidence of acute inflammation involving loops of large or small bowel in the abdomen. Vascular/Lymphatic: The portal, splenic and superior mesenteric veins are patent. Normal caliber abdominal aorta. Pathologically enlarged abdominal lymph nodes. Other:  None. Musculoskeletal: No suspicious bone lesions identified. IMPRESSION: 1. Normal MRI appearance of the pancreas without ductal dilation or ductal dilation and no cystic or solid hyperenhancing pancreatic lesion identified. 2. Bilateral benign Bosniak category 1 and 2 renal cysts requiring no independent imaging follow-up. Electronically Signed   By: Maudry Mayhew M.D.   On: 02/22/2022 09:57     Assessment & Plan:   Problem List Items Addressed This Visit     DM (diabetes mellitus), type 2 with complications (HCC) - Primary    Doing well on Ozempic low dose - use 0.125 mg/wk      Hypertension    Cont w/Olmesart HCT      CRI (chronic renal insufficiency), stage 2 (mild)    Hydrate well Avoid extream exertion, overheating etc GFR 68      Angina pectoris associated with type 2 diabetes mellitus (HCC)    On Crestor.  Low back pain    Piriformis syndrome L Flexeril at HS ROM exercises      Relevant Medications   cyclobenzaprine (FLEXERIL) 10 MG tablet      Meds ordered this encounter  Medications   cyclobenzaprine (FLEXERIL) 10 MG tablet    Sig: Take 1 tablet (10 mg total) by mouth at bedtime as needed for muscle spasms.    Dispense:  30 tablet    Refill:  0      Follow-up: Return in about 3 months (around 03/09/2023) for a follow-up visit.  Sonda Primes, MD

## 2022-12-07 NOTE — Assessment & Plan Note (Signed)
On Crestor 

## 2022-12-07 NOTE — Assessment & Plan Note (Signed)
Cont w/Olmesart HCT 

## 2022-12-07 NOTE — Assessment & Plan Note (Signed)
Piriformis syndrome L Flexeril at HS ROM exercises

## 2022-12-07 NOTE — Assessment & Plan Note (Signed)
Hydrate well Avoid extream exertion, overheating etc GFR 68

## 2022-12-07 NOTE — Assessment & Plan Note (Signed)
Doing well on Ozempic low dose - use 0.125 mg/wk

## 2022-12-23 ENCOUNTER — Other Ambulatory Visit: Payer: Self-pay | Admitting: Internal Medicine

## 2023-01-09 ENCOUNTER — Other Ambulatory Visit: Payer: Self-pay | Admitting: Internal Medicine

## 2023-01-13 ENCOUNTER — Other Ambulatory Visit: Payer: Self-pay | Admitting: Internal Medicine

## 2023-02-10 ENCOUNTER — Ambulatory Visit: Payer: PPO | Admitting: Internal Medicine

## 2023-02-10 ENCOUNTER — Encounter: Payer: Self-pay | Admitting: Internal Medicine

## 2023-02-10 VITALS — BP 110/62 | HR 61 | Temp 98.6°F | Ht 65.0 in | Wt 147.0 lb

## 2023-02-10 DIAGNOSIS — E118 Type 2 diabetes mellitus with unspecified complications: Secondary | ICD-10-CM

## 2023-02-10 DIAGNOSIS — I25118 Atherosclerotic heart disease of native coronary artery with other forms of angina pectoris: Secondary | ICD-10-CM

## 2023-02-10 DIAGNOSIS — Z7984 Long term (current) use of oral hypoglycemic drugs: Secondary | ICD-10-CM

## 2023-02-10 DIAGNOSIS — N182 Chronic kidney disease, stage 2 (mild): Secondary | ICD-10-CM | POA: Diagnosis not present

## 2023-02-10 DIAGNOSIS — I10 Essential (primary) hypertension: Secondary | ICD-10-CM | POA: Diagnosis not present

## 2023-02-10 DIAGNOSIS — R748 Abnormal levels of other serum enzymes: Secondary | ICD-10-CM | POA: Diagnosis not present

## 2023-02-10 LAB — CBC WITH DIFFERENTIAL/PLATELET
Basophils Absolute: 0 10*3/uL (ref 0.0–0.1)
Basophils Relative: 0.4 % (ref 0.0–3.0)
Eosinophils Absolute: 0.2 10*3/uL (ref 0.0–0.7)
Eosinophils Relative: 2.9 % (ref 0.0–5.0)
HCT: 39.3 % (ref 39.0–52.0)
Hemoglobin: 13.2 g/dL (ref 13.0–17.0)
Lymphocytes Relative: 33.6 % (ref 12.0–46.0)
Lymphs Abs: 2.5 10*3/uL (ref 0.7–4.0)
MCHC: 33.6 g/dL (ref 30.0–36.0)
MCV: 92 fl (ref 78.0–100.0)
Monocytes Absolute: 0.6 10*3/uL (ref 0.1–1.0)
Monocytes Relative: 8.5 % (ref 3.0–12.0)
Neutro Abs: 4.1 10*3/uL (ref 1.4–7.7)
Neutrophils Relative %: 54.6 % (ref 43.0–77.0)
Platelets: 230 10*3/uL (ref 150.0–400.0)
RBC: 4.28 Mil/uL (ref 4.22–5.81)
RDW: 13 % (ref 11.5–15.5)
WBC: 7.4 10*3/uL (ref 4.0–10.5)

## 2023-02-10 LAB — URINALYSIS
Bilirubin Urine: NEGATIVE
Hgb urine dipstick: NEGATIVE
Ketones, ur: NEGATIVE
Leukocytes,Ua: NEGATIVE
Nitrite: NEGATIVE
Specific Gravity, Urine: 1.02 (ref 1.000–1.030)
Total Protein, Urine: NEGATIVE
Urine Glucose: NEGATIVE
Urobilinogen, UA: 0.2 (ref 0.0–1.0)
pH: 6 (ref 5.0–8.0)

## 2023-02-10 LAB — COMPREHENSIVE METABOLIC PANEL
ALT: 15 U/L (ref 0–53)
AST: 15 U/L (ref 0–37)
Albumin: 4.2 g/dL (ref 3.5–5.2)
Alkaline Phosphatase: 52 U/L (ref 39–117)
BUN: 19 mg/dL (ref 6–23)
CO2: 30 mEq/L (ref 19–32)
Calcium: 9.8 mg/dL (ref 8.4–10.5)
Chloride: 98 mEq/L (ref 96–112)
Creatinine, Ser: 1.07 mg/dL (ref 0.40–1.50)
GFR: 69.34 mL/min (ref >60.00)
Glucose, Bld: 200 mg/dL — ABNORMAL HIGH (ref 70–99)
Potassium: 3.8 mEq/L (ref 3.5–5.1)
Sodium: 137 mEq/L (ref 135–145)
Total Bilirubin: 0.4 mg/dL (ref 0.2–1.2)
Total Protein: 6.9 g/dL (ref 6.0–8.3)

## 2023-02-10 LAB — HEMOGLOBIN A1C: Hgb A1c MFr Bld: 7.4 % — ABNORMAL HIGH (ref 4.6–6.5)

## 2023-02-10 MED ORDER — OLMESARTAN MEDOXOMIL 20 MG PO TABS: 20.0000 mg | ORAL_TABLET | Freq: Every day | ORAL | 3 refills | Status: DC

## 2023-02-10 NOTE — Assessment & Plan Note (Signed)
Low BP

## 2023-02-10 NOTE — Assessment & Plan Note (Addendum)
Re-start Famotidine.  No angina. He [place] of time [place] outside. On Pitavastatin Sch OV w/Dr Anne Fu

## 2023-02-10 NOTE — Progress Notes (Signed)
Subjective:  Patient ID: Ryan Ferguson, male    DOB: 1951/03/29  Age: 72 y.o. MRN: [id number]  CC: Follow-up (3 MNTH F/U, concern with medications)   HPI Ryan Ferguson presents for DM - wants to stop Ozempic  C/o R hand tremor, DM, low BP issues  He is here with his wife.  He spends a lot of time in [place] working outside.   Outpatient Medications Prior to Visit  Medication Sig Dispense Refill   aspirin EC 81 MG tablet Take 1 tablet (81 mg total) by mouth daily. 90 tablet 3   cholecalciferol (VITAMIN D) 1000 UNITS tablet Take 1,000 Units by mouth daily.     cyclobenzaprine (FLEXERIL) 10 MG tablet Take 1 tablet (10 mg total) by mouth at bedtime as needed for muscle spasms. 30 tablet 0   famotidine (PEPCID) 40 MG tablet Take 1 tablet (40 mg total) by mouth daily. 90 tablet 3   levothyroxine (SYNTHROID) 112 MCG tablet TAKE 1 TABLET BY MOUTH IN THE MORNING 90 tablet 1   LIVALO 1 MG TABS Take 1 tablet by mouth once daily 30 tablet 5   metFORMIN (GLUCOPHAGE) 1000 MG tablet Take 1 tablet by mouth twice daily 180 tablet 0   nitroGLYCERIN (NITROSTAT) 0.4 MG SL tablet DISSOLVE ONE TABLET UNDER THE TONGUE EVERY 5 MINUTES AS NEEDED FOR CHEST PAIN.  DO NOT EXCEED A TOTAL OF 3 DOSES IN 15 MINUTES 50 tablet 0   ondansetron (ZOFRAN ODT) 4 MG disintegrating tablet Take 1 tablet (4 mg total) by mouth every 8 (eight) hours as needed for nausea or vomiting. 20 tablet 0   ONE TOUCH ULTRA TEST test strip USE TEST STRIPS TO TEST BLOOD SUGAR ONCE DAILY  4   tamsulosin (FLOMAX) 0.4 MG CAPS capsule Take 1 capsule by mouth once daily 30 capsule 5   zolpidem (AMBIEN) 10 MG tablet Take 0.5-1 tablets (5-10 mg total) by mouth at bedtime as needed for sleep. 30 tablet 3   amLODipine (NORVASC) 5 MG tablet Take 1 tablet (5 mg total) by mouth daily. 90 tablet 1   olmesartan (BENICAR) 20 MG tablet Take 1 tablet (20 mg total) by mouth daily. 90 tablet 3   olmesartan-hydrochlorothiazide  (BENICAR HCT) 40-12.5 MG tablet      Semaglutide,0.25 or 0.5MG /DOS, (OZEMPIC, 0.25 OR 0.5 MG/DOSE,) 2 MG/3ML SOPN Use 0.25 mg weekly sq for 1 month, then 0.5 mg sq weekly 3 mL 3   No facility-administered medications prior to visit.    ROS: Review of Systems  Constitutional:  Negative for appetite change, fatigue and unexpected weight change.  HENT:  Negative for congestion, nosebleeds, sneezing, sore throat and trouble swallowing.   Eyes:  Negative for itching and visual disturbance.  Respiratory:  Negative for cough.   Cardiovascular:  Negative for chest pain, palpitations and leg swelling.  Gastrointestinal:  Negative for abdominal distention, blood in stool, diarrhea and nausea.  Genitourinary:  Negative for frequency and hematuria.  Musculoskeletal:  Negative for back pain, gait problem, joint swelling and neck pain.  Skin:  Negative for rash.  Neurological:  Negative for dizziness, tremors, speech difficulty and weakness.  Psychiatric/Behavioral:  Negative for agitation, dysphoric mood and sleep disturbance. The patient is not nervous/anxious.     Objective:  BP 110/62 (BP Location: Left Arm, Patient Position: Sitting, Cuff Size: Large)   Pulse 61   Temp 98.6 F (37 C) (Oral)   Ht 5\' 5"  (1.651 m)   Wt  147 lb (66.7 kg)   SpO2 96%   BMI 24.46 kg/m   BP Readings from Last 3 Encounters:  02/10/23 110/62  12/07/22 130/82  11/17/22 124/70    Wt Readings from Last 3 Encounters:  02/10/23 147 lb (66.7 kg)  12/07/22 146 lb (66.2 kg)  11/17/22 146 lb 6.4 oz (66.4 kg)    Physical Exam Constitutional:      General: He is not in acute distress.    Appearance: He is well-developed.     Comments: NAD  Eyes:     Conjunctiva/sclera: Conjunctivae normal.     Pupils: Pupils are equal, round, and reactive to light.  Neck:     Thyroid: No thyromegaly.     Vascular: No JVD.  Cardiovascular:     Rate and Rhythm: Normal rate and regular rhythm.     Heart sounds: Normal heart  sounds. No murmur heard.    No friction rub. No gallop.  Pulmonary:     Effort: Pulmonary effort is normal. No respiratory distress.     Breath sounds: Normal breath sounds. No wheezing or rales.  Chest:     Chest wall: No tenderness.  Abdominal:     General: Bowel sounds are normal. There is no distension.     Palpations: Abdomen is soft. There is no mass.     Tenderness: There is no abdominal tenderness. There is no guarding or rebound.  Musculoskeletal:        General: No tenderness. Normal range of motion.     Cervical back: Normal range of motion.  Lymphadenopathy:     Cervical: No cervical adenopathy.  Skin:    General: Skin is warm and dry.     Findings: No rash.  Neurological:     Mental Status: He is alert and oriented to person, place, and time.     Cranial Nerves: No cranial nerve deficit.     Motor: No abnormal muscle tone.     Coordination: Coordination normal.     Gait: Gait normal.     Deep Tendon Reflexes: Reflexes are normal and symmetric.  Psychiatric:        Behavior: Behavior normal.        Thought Content: Thought content normal.        Judgment: Judgment normal.   The patient looks well  Lab Results  Component Value Date   WBC 7.4 02/10/2023   HGB 13.2 02/10/2023   HCT 39.3 02/10/2023   PLT 230.0 02/10/2023   GLUCOSE 200 (H) 02/10/2023   CHOL 157 09/06/2022   TRIG 93.0 09/06/2022   HDL 48.90 09/06/2022   LDLCALC 89 09/06/2022   ALT 15 02/10/2023   AST 15 02/10/2023   NA 137 02/10/2023   K 3.8 02/10/2023   CL 98 02/10/2023   CREATININE 1.07 02/10/2023   BUN 19 02/10/2023   CO2 30 02/10/2023   TSH 0.55 08/07/2021   PSA 2.17 03/26/2020   HGBA1C 7.4 (H) 02/10/2023   MICROALBUR 2.4 (H) 09/06/2022    MR ABDOMEN MRCP W WO CONTAST  Result Date: 02/22/2022 CLINICAL DATA:  Elevated lipase, family history of pancreatic cancer. EXAM: MRI ABDOMEN WITHOUT AND WITH CONTRAST (INCLUDING MRCP) TECHNIQUE: Multiplanar multisequence MR imaging of the  abdomen was performed both before and after the administration of intravenous contrast. Heavily T2-weighted images of the biliary and pancreatic ducts were obtained, and three-dimensional MRCP images were rendered by post processing. CONTRAST:  6.65mL GADAVIST GADOBUTROL 1 MMOL/ML IV SOLN COMPARISON:  Multiple priors  including most recent CT January 08, 2022 and MRI December 11, 2019. FINDINGS: Lower chest: No acute abnormality. Hepatobiliary: No significant hepatic steatosis. Gallbladder is unremarkable. No biliary ductal dilation. Pancreas: No pancreatic ductal dilation. Intrinsic T1 signal of the pancreatic parenchyma is within normal limits. No cystic or solid hyperenhancing pancreatic lesion identified. Spleen:  Splenomegaly or focal splenic lesion. Adrenals/Urinary Tract: Bilateral adrenal glands appear normal. Bilateral fluid signal renal lesions of varying degrees of complexity measuring up to 4.8 cm on image 20/8, none of which demonstrate wall/septal thickening or enhancing nodularity, consistent with benign Bosniak category 1 and 2 renal cysts requiring no independent imaging follow-up. Stomach/Bowel: Moderate volume of formed stool throughout the colon. Stomach is unremarkable for degree of distension. No pathologic dilation or evidence of acute inflammation involving loops of large or small bowel in the abdomen. Vascular/Lymphatic: The portal, splenic and superior mesenteric veins are patent. Normal caliber abdominal aorta. Pathologically enlarged abdominal lymph nodes. Other:  None. Musculoskeletal: No suspicious bone lesions identified. IMPRESSION: 1. Normal MRI appearance of the pancreas without ductal dilation or ductal dilation and no cystic or solid hyperenhancing pancreatic lesion identified. 2. Bilateral benign Bosniak category 1 and 2 renal cysts requiring no independent imaging follow-up. Electronically Signed   By: Maudry Mayhew M.D.   On: 02/22/2022 09:57   MR 3D Recon At Scanner  Result Date:  02/22/2022 CLINICAL DATA:  Elevated lipase, family history of pancreatic cancer. EXAM: MRI ABDOMEN WITHOUT AND WITH CONTRAST (INCLUDING MRCP) TECHNIQUE: Multiplanar multisequence MR imaging of the abdomen was performed both before and after the administration of intravenous contrast. Heavily T2-weighted images of the biliary and pancreatic ducts were obtained, and three-dimensional MRCP images were rendered by post processing. CONTRAST:  6.65mL GADAVIST GADOBUTROL 1 MMOL/ML IV SOLN COMPARISON:  Multiple priors including most recent CT January 08, 2022 and MRI December 11, 2019. FINDINGS: Lower chest: No acute abnormality. Hepatobiliary: No significant hepatic steatosis. Gallbladder is unremarkable. No biliary ductal dilation. Pancreas: No pancreatic ductal dilation. Intrinsic T1 signal of the pancreatic parenchyma is within normal limits. No cystic or solid hyperenhancing pancreatic lesion identified. Spleen:  Splenomegaly or focal splenic lesion. Adrenals/Urinary Tract: Bilateral adrenal glands appear normal. Bilateral fluid signal renal lesions of varying degrees of complexity measuring up to 4.8 cm on image 20/8, none of which demonstrate wall/septal thickening or enhancing nodularity, consistent with benign Bosniak category 1 and 2 renal cysts requiring no independent imaging follow-up. Stomach/Bowel: Moderate volume of formed stool throughout the colon. Stomach is unremarkable for degree of distension. No pathologic dilation or evidence of acute inflammation involving loops of large or small bowel in the abdomen. Vascular/Lymphatic: The portal, splenic and superior mesenteric veins are patent. Normal caliber abdominal aorta. Pathologically enlarged abdominal lymph nodes. Other:  None. Musculoskeletal: No suspicious bone lesions identified. IMPRESSION: 1. Normal MRI appearance of the pancreas without ductal dilation or ductal dilation and no cystic or solid hyperenhancing pancreatic lesion identified. 2. Bilateral benign  Bosniak category 1 and 2 renal cysts requiring no independent imaging follow-up. Electronically Signed   By: Maudry Mayhew M.D.   On: 02/22/2022 09:57    Assessment & Plan:   Problem List Items Addressed This Visit     DM (diabetes mellitus), type 2 with complications (HCC)    Side effects on Ozempic low dose - use 0.125 mg/wk -the patient was to discontinue.  Okay to discontinue      Relevant Medications   olmesartan (BENICAR) 20 MG tablet   Hypertension  Low BP      Relevant Medications   olmesartan (BENICAR) 20 MG tablet   Other Relevant Orders   Comprehensive metabolic panel (Completed)   CBC with Differential/Platelet (Completed)   Hemoglobin A1c (Completed)   Urinalysis (Completed)   CRI (chronic renal insufficiency), stage 2 (mild)    Reduced Olmesartan Hydrate well: He spends a lot of time [place] his [place] the [place] working outside in the heat.      Relevant Orders   Comprehensive metabolic panel (Completed)   CBC with Differential/Platelet (Completed)   Hemoglobin A1c (Completed)   Urinalysis (Completed)   CAD (coronary artery disease), native coronary artery - Primary    Re-start Famotidine.  No angina. He spends a lot of time in his [place] the [place] working outside. On Pitavastatin Sch OV w/Dr Anne Fu       Relevant Medications   olmesartan (BENICAR) 20 MG tablet   Other Relevant Orders   Ambulatory referral to Cardiology   Elevated lipase      Meds ordered this encounter  Medications   olmesartan (BENICAR) 20 MG tablet    Sig: Take 1 tablet (20 mg total) by mouth daily.    Dispense:  90 tablet    Refill:  3      Follow-up: Return in about 3 months (around 05/13/2023) for a follow-up visit.  Sonda Primes, MD

## 2023-02-10 NOTE — Assessment & Plan Note (Addendum)
Reduced Olmesartan Hydrate well: He spends a lot of time in his [place] the [place] working outside in the [place].

## 2023-02-13 ENCOUNTER — Encounter: Payer: Self-pay | Admitting: Internal Medicine

## 2023-02-13 NOTE — Assessment & Plan Note (Signed)
Side effects on Ozempic low dose - use 0.125 mg/wk -the patient was to discontinue.  Okay to discontinue

## 2023-03-10 ENCOUNTER — Ambulatory Visit: Payer: PPO | Admitting: Internal Medicine

## 2023-03-21 ENCOUNTER — Other Ambulatory Visit: Payer: Self-pay | Admitting: Internal Medicine

## 2023-03-22 ENCOUNTER — Ambulatory Visit (INDEPENDENT_AMBULATORY_CARE_PROVIDER_SITE_OTHER): Payer: PPO | Admitting: Podiatry

## 2023-03-22 ENCOUNTER — Encounter: Payer: Self-pay | Admitting: Podiatry

## 2023-03-22 DIAGNOSIS — E119 Type 2 diabetes mellitus without complications: Secondary | ICD-10-CM | POA: Diagnosis not present

## 2023-03-22 DIAGNOSIS — M79674 Pain in right toe(s): Secondary | ICD-10-CM

## 2023-03-22 DIAGNOSIS — B351 Tinea unguium: Secondary | ICD-10-CM | POA: Diagnosis not present

## 2023-03-24 NOTE — Progress Notes (Signed)
Subjective:  Patient ID: Ryan Ferguson, male    DOB: February 27, 1951,  MRN: [id number]  Chief Complaint  Patient presents with   Nail Problem    Onychomycosis of toenails right foot. Patient is diabetic, reports last A1c ~ 7.4    72 y.o. male presents with the above complaint. History confirmed with patient.   Objective:  Physical Exam: warm, good capillary refill, no trophic changes or ulcerative lesions, normal DP and PT pulses, and normal sensory exam. Left Foot: normal exam, no swelling, tenderness, instability; ligaments intact, full range of motion of all ankle/foot joints Right Foot: dystrophic yellowed discolored nail plates with subungual debris  Assessment:   1. Pain due to onychomycosis of toenail of right foot   2. Encounter for diabetic foot exam [facility])      Plan:  Patient was evaluated and treated and all questions answered.  Patient educated on diabetes. Discussed proper diabetic foot care and discussed risks and complications of disease. Educated patient in depth on reasons to return to the office immediately should he/she discover anything concerning or new on the feet. All questions answered. Discussed proper shoes as well.   Discussed the etiology and treatment options for the condition in detail with the patient. Educated patient on the topical and oral treatment options for mycotic nails. Recommended debridement of the nails today. Sharp and mechanical debridement performed of all painful and mycotic nails today. Nails debrided in length and thickness using a nail nipper to level of comfort. Discussed treatment options including appropriate shoe gear. Follow up as needed for painful nails.    Return if symptoms worsen or fail to improve.

## 2023-04-13 ENCOUNTER — Other Ambulatory Visit: Payer: Self-pay | Admitting: Internal Medicine

## 2023-04-13 MED ORDER — OLMESARTAN MEDOXOMIL 20 MG PO TABS: 20.0000 mg | ORAL_TABLET | Freq: Two times a day (BID) | ORAL | 3 refills | Status: DC

## 2023-04-17 ENCOUNTER — Other Ambulatory Visit: Payer: Self-pay | Admitting: Internal Medicine

## 2023-04-28 ENCOUNTER — Ambulatory Visit: Payer: PPO | Admitting: Internal Medicine

## 2023-04-28 ENCOUNTER — Encounter: Payer: Self-pay | Admitting: Internal Medicine

## 2023-04-28 VITALS — BP 120/78 | HR 56 | Temp 98.3°F | Ht 65.0 in | Wt 147.0 lb

## 2023-04-28 DIAGNOSIS — D2271 Melanocytic nevi of right lower limb, including hip: Secondary | ICD-10-CM | POA: Insufficient documentation

## 2023-04-28 DIAGNOSIS — R1031 Right lower quadrant pain: Secondary | ICD-10-CM | POA: Insufficient documentation

## 2023-04-28 DIAGNOSIS — E118 Type 2 diabetes mellitus with unspecified complications: Secondary | ICD-10-CM

## 2023-04-28 DIAGNOSIS — R413 Other amnesia: Secondary | ICD-10-CM | POA: Diagnosis not present

## 2023-04-28 DIAGNOSIS — I25118 Atherosclerotic heart disease of native coronary artery with other forms of angina pectoris: Secondary | ICD-10-CM

## 2023-04-28 DIAGNOSIS — R29898 Other symptoms and signs involving the musculoskeletal system: Secondary | ICD-10-CM | POA: Insufficient documentation

## 2023-04-28 MED ORDER — DONEPEZIL HCL 5 MG PO TABS: 5.0000 mg | ORAL_TABLET | Freq: Every day | ORAL | 5 refills | Status: DC

## 2023-04-28 NOTE — Patient Instructions (Signed)
Ginko Biloba

## 2023-04-28 NOTE — Assessment & Plan Note (Signed)
F/u w/Cardiology He still [place] a [place] of [place]. On Pitavastatin - will hold due to memory issue Consider Repatha PharmD consult

## 2023-04-28 NOTE — Progress Notes (Signed)
Subjective:  Patient ID: Ryan Ferguson, male    DOB: [birth year]  Age: 72 y.o. MRN: [id number]  CC: Medical Management of Chronic Issues (3 MNTH F/U)   HPI Ryan Ferguson presents for DM, HTN, memory loss - asking the same thing all the time  Outpatient Medications Prior to Visit  Medication Sig Dispense Refill   aspirin EC 81 MG tablet Take 1 tablet (81 mg total) by mouth daily. 90 tablet 3   cholecalciferol (VITAMIN D) 1000 UNITS tablet Take 1,000 Units by mouth daily.     famotidine (PEPCID) 40 MG tablet Take 1 tablet (40 mg total) by mouth daily. 90 tablet 3   levothyroxine (SYNTHROID) 112 MCG tablet TAKE 1 TABLET BY MOUTH IN THE MORNING 90 tablet 1   LIVALO 1 MG TABS Take 1 tablet by mouth once daily 30 tablet 5   metFORMIN (GLUCOPHAGE) 1000 MG tablet Take 1 tablet by mouth twice daily 180 tablet 0   nitroGLYCERIN (NITROSTAT) 0.4 MG SL tablet DISSOLVE ONE TABLET UNDER THE TONGUE EVERY 5 MINUTES AS NEEDED FOR CHEST PAIN.  DO NOT EXCEED A TOTAL OF 3 DOSES IN 15 MINUTES 50 tablet 0   olmesartan (BENICAR) 20 MG tablet Take 1 tablet (20 mg total) by mouth 2 (two) times daily. 180 tablet 3   ondansetron (ZOFRAN ODT) 4 MG disintegrating tablet Take 1 tablet (4 mg total) by mouth every 8 (eight) hours as needed for nausea or vomiting. 20 tablet 0   ONE TOUCH ULTRA TEST test strip USE TEST STRIPS TO TEST BLOOD SUGAR ONCE DAILY  4   tamsulosin (FLOMAX) 0.4 MG CAPS capsule Take 1 capsule by mouth once daily 30 capsule 11   zolpidem (AMBIEN) 10 MG tablet Take 0.5-1 tablets (5-10 mg total) by mouth at bedtime as needed for sleep. 30 tablet 3   cyclobenzaprine (FLEXERIL) 10 MG tablet Take 1 tablet (10 mg total) by mouth at bedtime as needed for muscle spasms. 30 tablet 0   No facility-administered medications prior to visit.    ROS: Review of Systems  Constitutional:  Negative for appetite change, fatigue and unexpected weight change.  HENT:  Negative for congestion, nosebleeds, sneezing,  sore throat and trouble swallowing.   Eyes:  Negative for itching and visual disturbance.  Respiratory:  Negative for cough.   Cardiovascular:  Negative for chest pain, palpitations and leg swelling.  Gastrointestinal:  Negative for abdominal distention, blood in stool, diarrhea and nausea.  Genitourinary:  Negative for frequency and hematuria.  Musculoskeletal:  Negative for back pain, gait problem, joint swelling and neck pain.  Skin:  Negative for rash.  Neurological:  Negative for dizziness, tremors, speech difficulty and weakness.  Psychiatric/Behavioral:  Negative for agitation, dysphoric mood and sleep disturbance. The patient is not nervous/anxious.     Objective:  BP 120/78 (BP Location: Left Arm, Patient Position: Sitting, Cuff Size: Normal)   Pulse (!) 56   Temp 98.3 F (36.8 C) (Oral)   Ht 5\' 5"  (1.651 m)   Wt 147 lb (66.7 kg)   SpO2 98%   BMI 24.46 kg/m   BP Readings from Last 3 Encounters:  04/28/23 120/78  02/10/23 110/62  12/07/22 130/82    Wt Readings from Last 3 Encounters:  04/28/23 147 lb (66.7 kg)  02/10/23 147 lb (66.7 kg)  12/07/22 146 lb (66.2 kg)    Physical Exam Constitutional:      General: He is not in acute distress.    Appearance: Normal appearance.  He is well-developed.     Comments: NAD  Eyes:     Conjunctiva/sclera: Conjunctivae normal.     Pupils: Pupils are equal, round, and reactive to light.  Neck:     Thyroid: No thyromegaly.     Vascular: No JVD.  Cardiovascular:     Rate and Rhythm: Normal rate and regular rhythm.     Heart sounds: Normal heart sounds. No murmur heard.    No friction rub. No gallop.  Pulmonary:     Effort: Pulmonary effort is normal. No respiratory distress.     Breath sounds: Normal breath sounds. No wheezing or rales.  Chest:     Chest wall: No tenderness.  Abdominal:     General: Bowel sounds are normal. There is no distension.     Palpations: Abdomen is soft. There is no mass.     Tenderness: There  is no abdominal tenderness. There is no guarding or rebound.  Musculoskeletal:        General: No tenderness. Normal range of motion.     Cervical back: Normal range of motion.  Lymphadenopathy:     Cervical: No cervical adenopathy.  Skin:    General: Skin is warm and dry.     Findings: No rash.  Neurological:     Mental Status: He is alert and oriented to person, place, and time.     Cranial Nerves: No cranial nerve deficit.     Motor: No abnormal muscle tone.     Coordination: Coordination normal.     Gait: Gait normal.     Deep Tendon Reflexes: Reflexes are normal and symmetric.  Psychiatric:        Behavior: Behavior normal.        Thought Content: Thought content normal.        Judgment: Judgment normal.       A total time of 45 minutes was spent preparing to see the patient, reviewing tests, x-rays, operative reports and other medical records.  Also, obtaining history and performing comprehensive physical exam.  Additionally, counseling the patient regarding the above listed issues, memory tests.   Finally, documenting clinical information in the health records, coordination of care, educating the patient. Lab Results  Component Value Date   WBC 7.4 02/10/2023   HGB 13.2 02/10/2023   HCT 39.3 02/10/2023   PLT 230.0 02/10/2023   GLUCOSE 200 (H) 02/10/2023   CHOL 157 09/06/2022   TRIG 93.0 09/06/2022   HDL 48.90 09/06/2022   LDLCALC 89 09/06/2022   ALT 15 02/10/2023   AST 15 02/10/2023   NA 137 02/10/2023   K 3.8 02/10/2023   CL 98 02/10/2023   CREATININE 1.07 02/10/2023   BUN 19 02/10/2023   CO2 30 02/10/2023   TSH 0.55 08/07/2021   PSA 2.17 03/26/2020   HGBA1C 7.4 (H) 02/10/2023   MICROALBUR 2.4 (H) 09/06/2022    MR ABDOMEN MRCP W WO CONTAST  Result Date: 02/22/2022 CLINICAL DATA:  Elevated lipase, family history of pancreatic cancer. EXAM: MRI ABDOMEN WITHOUT AND WITH CONTRAST (INCLUDING MRCP) TECHNIQUE: Multiplanar multisequence MR imaging of the abdomen was  performed both before and after the administration of intravenous contrast. Heavily T2-weighted images of the biliary and pancreatic ducts were obtained, and three-dimensional MRCP images were rendered by post processing. CONTRAST:  6.14mL GADAVIST GADOBUTROL 1 MMOL/ML IV SOLN COMPARISON:  Multiple priors including most recent CT January 08, 2022 and MRI December 11, 2019. FINDINGS: Lower chest: No acute abnormality. Hepatobiliary: No significant hepatic steatosis.  Gallbladder is unremarkable. No biliary ductal dilation. Pancreas: No pancreatic ductal dilation. Intrinsic T1 signal of the pancreatic parenchyma is within normal limits. No cystic or solid hyperenhancing pancreatic lesion identified. Spleen:  Splenomegaly or focal splenic lesion. Adrenals/Urinary Tract: Bilateral adrenal glands appear normal. Bilateral fluid signal renal lesions of varying degrees of complexity measuring up to 4.8 cm on image 20/8, none of which demonstrate wall/septal thickening or enhancing nodularity, consistent with benign Bosniak category 1 and 2 renal cysts requiring no independent imaging follow-up. Stomach/Bowel: Moderate volume of formed stool throughout the colon. Stomach is unremarkable for degree of distension. No pathologic dilation or evidence of acute inflammation involving loops of large or small bowel in the abdomen. Vascular/Lymphatic: The portal, splenic and superior mesenteric veins are patent. Normal caliber abdominal aorta. Pathologically enlarged abdominal lymph nodes. Other:  None. Musculoskeletal: No suspicious bone lesions identified. IMPRESSION: 1. Normal MRI appearance of the pancreas without ductal dilation or ductal dilation and no cystic or solid hyperenhancing pancreatic lesion identified. 2. Bilateral benign Bosniak category 1 and 2 renal cysts requiring no independent imaging follow-up. Electronically Signed   By: Maudry Mayhew M.D.   On: 02/22/2022 09:57   MR 3D Recon At Scanner  Result Date:  02/22/2022 CLINICAL DATA:  Elevated lipase, family history of pancreatic cancer. EXAM: MRI ABDOMEN WITHOUT AND WITH CONTRAST (INCLUDING MRCP) TECHNIQUE: Multiplanar multisequence MR imaging of the abdomen was performed both before and after the administration of intravenous contrast. Heavily T2-weighted images of the biliary and pancreatic ducts were obtained, and three-dimensional MRCP images were rendered by post processing. CONTRAST:  6.29mL GADAVIST GADOBUTROL 1 MMOL/ML IV SOLN COMPARISON:  Multiple priors including most recent CT January 08, 2022 and MRI December 11, 2019. FINDINGS: Lower chest: No acute abnormality. Hepatobiliary: No significant hepatic steatosis. Gallbladder is unremarkable. No biliary ductal dilation. Pancreas: No pancreatic ductal dilation. Intrinsic T1 signal of the pancreatic parenchyma is within normal limits. No cystic or solid hyperenhancing pancreatic lesion identified. Spleen:  Splenomegaly or focal splenic lesion. Adrenals/Urinary Tract: Bilateral adrenal glands appear normal. Bilateral fluid signal renal lesions of varying degrees of complexity measuring up to 4.8 cm on image 20/8, none of which demonstrate wall/septal thickening or enhancing nodularity, consistent with benign Bosniak category 1 and 2 renal cysts requiring no independent imaging follow-up. Stomach/Bowel: Moderate volume of formed stool throughout the colon. Stomach is unremarkable for degree of distension. No pathologic dilation or evidence of acute inflammation involving loops of large or small bowel in the abdomen. Vascular/Lymphatic: The portal, splenic and superior mesenteric veins are patent. Normal caliber abdominal aorta. Pathologically enlarged abdominal lymph nodes. Other:  None. Musculoskeletal: No suspicious bone lesions identified. IMPRESSION: 1. Normal MRI appearance of the pancreas without ductal dilation or ductal dilation and no cystic or solid hyperenhancing pancreatic lesion identified. 2. Bilateral benign  Bosniak category 1 and 2 renal cysts requiring no independent imaging follow-up. Electronically Signed   By: Maudry Mayhew M.D.   On: 02/22/2022 09:57    Assessment & Plan:   Problem List Items Addressed This Visit     DM (diabetes mellitus), type 2 with complications (HCC)    Side effects on Ozempic low dose - he discontinued      CAD (coronary artery disease), native coronary artery    F/u w/Cardiology He still spends a lot of time [place] working outside. On Pitavastatin - will hold due to memory issue Consider Repatha PharmD consult       Relevant  Orders   AMB Referral VBCI Care Management   Ambulatory referral to Cardiology   Memory loss - Primary    Worse Start Aricept 5 mg at bedtime, Ginko Biloba Hold Livalo  Clock drawing 9/10 MMSE 26/30. 3 object recall in 5 minutes is 0/3  Neurology ref was offered         Meds ordered this encounter  Medications   donepezil (ARICEPT) 5 MG tablet    Sig: Take 1 tablet (5 mg total) by mouth at bedtime.    Dispense:  30 tablet    Refill:  5      Follow-up: Return in about 2 months (around 06/28/2023) for a follow-up visit.  Sonda Primes, MD

## 2023-04-28 NOTE — Assessment & Plan Note (Signed)
Side effects on Ozempic low dose - he discontinued

## 2023-04-28 NOTE — Assessment & Plan Note (Addendum)
Worse Start Aricept 5 mg at bedtime, Ginko Biloba Hold Livalo  Clock drawing 9/10 MMSE 26/30. 3 object recall in 5 minutes is 0/3  Neurology ref was offered

## 2023-05-05 ENCOUNTER — Telehealth: Payer: Self-pay

## 2023-05-05 NOTE — Progress Notes (Signed)
   Care Guide Note  05/05/2023 Name: [name] MRN: [id number] DOB: [birth year]  Referred by: Tresa Garter, MD Reason for referral : Care Coordination (Outreach to schedule with Pharm d )   Ryan Ferguson is a 72 y.o. year old male who is a primary care patient of Plotnikov, Georgina Quint, MD. Mickel Crow was referred to the pharmacist for assistance related to HLD.    Successful contact was made with the patient to discuss pharmacy services including being ready for the pharmacist to call at least 5 minutes before the scheduled appointment time, to have medication bottles and any blood sugar or blood pressure readings ready for review. The patient agreed to meet with the pharmacist via with the pharmacist via telephone visit on (date/time).  05/16/2023  Penne Lash, RMA Care Guide [facility]  [place], [place] [address] Direct Dial: [phone] [email or web address]

## 2023-05-16 ENCOUNTER — Other Ambulatory Visit: Payer: PPO

## 2023-05-16 DIAGNOSIS — I2510 Atherosclerotic heart disease of native coronary artery without angina pectoris: Secondary | ICD-10-CM

## 2023-05-16 NOTE — Progress Notes (Cosign Needed Addendum)
   05/16/2023 Name: Ryan Ferguson MRN: [id number] DOB: 08-Mar-1951  No chief complaint on file.   Ryan Ferguson is a 72 y.o. year old male who presented for a telephone visit.   They were referred to the pharmacist by their PCP for assistance in managing hyperlipidemia/CAD.  PCP wants patient to start Repatha.  Subjective:  Care Team: Primary Care Provider: Tresa Garter, MD ; Next Scheduled Visit: 06/28/2023 Cardiologist: Dr. Caro Hight; Next Scheduled Visit: 07/01/2023  Medication Access/Adherence  Current Pharmacy:  [facility] [address] [place] - [address] [place] [address] Phone: [phone] Fax: [phone]   Patient reports affordability concerns with their medications: No  Patient reports access/transportation concerns to their pharmacy: No  Patient reports adherence concerns with their medications:  No     Hyperlipidemia/ASCVD Risk Reduction  Current lipid lowering medications: Livalo 1 mg daily Medications tried in the past: rosuvastatin, atorvastatin, pravastatin (myalgias)  *PCP wants pt to stop statin due to worsened memory impairment. Pt currently still taking.  Antiplatelet regimen: aspirin 81 mg daily  Coronary calcium score: 1492, stents x2 in July 2022  Objective:  Lab Results  Component Value Date   HGBA1C 7.4 (H) 02/10/2023    Lab Results  Component Value Date   CREATININE 1.07 02/10/2023   BUN 19 02/10/2023   NA 137 02/10/2023   K 3.8 02/10/2023   CL 98 02/10/2023   CO2 30 02/10/2023    Lab Results  Component Value Date   CHOL 157 09/06/2022   HDL 48.90 09/06/2022   LDLCALC 89 09/06/2022   TRIG 93.0 09/06/2022   CHOLHDL 3 09/06/2022    Medications Reviewed Today   Medications were not reviewed in this encounter       Assessment/Plan:    Hyperlipidemia/CAD: - Currently uncontrolled, LDL goal <55 due to CAD + U7OZ - Submitted prior auth for Repatha 140  mg SQ q2 weeks to CoverMyMeds (Key: [id number]), awaiting decision  Prior auth for Repatha denied due to they require patient to take ezetimibe x12 weeks as adjunct to max tolerated statin or have documented intolerance to ezetimibe.   Consulted with PCP who agrees with adding ezetimibe x12 weeks.  Follow Up Plan: Seeing PCP 06/27/22  Arbutus Leas, PharmD, BCPS Clinical Pharmacist [facility] [phone]  Medical screening examination/treatment/procedure(s) were performed by non-physician practitioner and as supervising physician I was immediately available for consultation/collaboration.  I agree with above. Jacinta Shoe, MD

## 2023-05-23 ENCOUNTER — Telehealth: Payer: Self-pay

## 2023-05-23 NOTE — Telephone Encounter (Signed)
Pharmacy Patient Advocate Encounter  Received notification from [facility] [employer] that Prior Authorization for REPATHA has been DENIED.  Full denial letter will be uploaded to the media tab. See denial reason below.

## 2023-05-26 MED ORDER — EZETIMIBE 10 MG PO TABS: 10.0000 mg | ORAL_TABLET | Freq: Every day | ORAL | 0 refills | Status: DC

## 2023-05-26 NOTE — Patient Instructions (Signed)
It was a pleasure speaking with you today!  I have sent ezetimibe 10 mg 1 tablet by mouth daily to your pharmacy. As discussed, insurance is requiring trying this medication for 12 weeks before approving the other medication for cholesterol that Dr. Posey Rea recommended.   Feel free to call with any questions or concerns!  Arbutus Leas, PharmD, BCPS [facility] Clinical Pharmacist [facility] [phone]

## 2023-05-31 ENCOUNTER — Other Ambulatory Visit: Payer: Self-pay

## 2023-05-31 ENCOUNTER — Encounter (HOSPITAL_BASED_OUTPATIENT_CLINIC_OR_DEPARTMENT_OTHER): Payer: Self-pay | Admitting: Emergency Medicine

## 2023-05-31 ENCOUNTER — Emergency Department (HOSPITAL_BASED_OUTPATIENT_CLINIC_OR_DEPARTMENT_OTHER): Payer: PPO

## 2023-05-31 DIAGNOSIS — K838 Other specified diseases of biliary tract: Secondary | ICD-10-CM | POA: Diagnosis not present

## 2023-05-31 DIAGNOSIS — I129 Hypertensive chronic kidney disease with stage 1 through stage 4 chronic kidney disease, or unspecified chronic kidney disease: Secondary | ICD-10-CM | POA: Diagnosis not present

## 2023-05-31 DIAGNOSIS — N200 Calculus of kidney: Secondary | ICD-10-CM | POA: Diagnosis not present

## 2023-05-31 DIAGNOSIS — E1122 Type 2 diabetes mellitus with diabetic chronic kidney disease: Secondary | ICD-10-CM | POA: Diagnosis not present

## 2023-05-31 DIAGNOSIS — N189 Chronic kidney disease, unspecified: Secondary | ICD-10-CM | POA: Diagnosis not present

## 2023-05-31 DIAGNOSIS — R1031 Right lower quadrant pain: Secondary | ICD-10-CM | POA: Diagnosis present

## 2023-05-31 DIAGNOSIS — N132 Hydronephrosis with renal and ureteral calculous obstruction: Secondary | ICD-10-CM | POA: Diagnosis not present

## 2023-05-31 DIAGNOSIS — Z955 Presence of coronary angioplasty implant and graft: Secondary | ICD-10-CM | POA: Diagnosis not present

## 2023-05-31 DIAGNOSIS — N281 Cyst of kidney, acquired: Secondary | ICD-10-CM | POA: Diagnosis not present

## 2023-05-31 DIAGNOSIS — Z87891 Personal history of nicotine dependence: Secondary | ICD-10-CM | POA: Insufficient documentation

## 2023-05-31 DIAGNOSIS — R109 Unspecified abdominal pain: Secondary | ICD-10-CM | POA: Diagnosis not present

## 2023-05-31 LAB — URINALYSIS, ROUTINE W REFLEX MICROSCOPIC
Bilirubin Urine: NEGATIVE
Glucose, UA: 100 mg/dL — AB
Ketones, ur: NEGATIVE mg/dL
Leukocytes,Ua: NEGATIVE
Nitrite: NEGATIVE
Protein, ur: 30 mg/dL — AB
RBC / HPF: >50 RBC/hpf (ref 0–5)
Specific Gravity, Urine: 1.02 (ref 1.005–1.030)
pH: 5.5 (ref 5.0–8.0)

## 2023-05-31 NOTE — ED Triage Notes (Signed)
Patient presents with right sided flank pain since this evening. Reports hx kidney stones. Oxycodone taken at 2100 with minimal relief. Denies urinary sx

## 2023-06-01 ENCOUNTER — Emergency Department (HOSPITAL_BASED_OUTPATIENT_CLINIC_OR_DEPARTMENT_OTHER)
Admission: EM | Admit: 2023-06-01 | Discharge: 2023-06-01 | Disposition: A | Discharge status: Home / Self Care | Payer: PPO | Attending: Emergency Medicine | Admitting: Emergency Medicine

## 2023-06-01 DIAGNOSIS — N200 Calculus of kidney: Secondary | ICD-10-CM

## 2023-06-01 LAB — COMPREHENSIVE METABOLIC PANEL
ALT: 16 U/L (ref 0–44)
AST: 13 U/L — ABNORMAL LOW (ref 15–41)
Albumin: 4.4 g/dL (ref 3.5–5.0)
Alkaline Phosphatase: 47 U/L (ref 38–126)
Anion gap: 10 (ref 5–15)
BUN: 21 mg/dL (ref 8–23)
CO2: 26 mmol/L (ref 22–32)
Calcium: 10.2 mg/dL (ref 8.9–10.3)
Chloride: 101 mmol/L (ref 98–111)
Creatinine, Ser: 1.05 mg/dL (ref 0.61–1.24)
GFR, Estimated: >60 mL/min (ref >60)
Glucose, Bld: 252 mg/dL — ABNORMAL HIGH (ref 70–99)
Potassium: 4.4 mmol/L (ref 3.5–5.1)
Sodium: 137 mmol/L (ref 135–145)
Total Bilirubin: 0.4 mg/dL (ref <1.2)
Total Protein: 7 g/dL (ref 6.5–8.1)

## 2023-06-01 LAB — CBC
HCT: 40.3 % (ref 39.0–52.0)
Hemoglobin: 13.6 g/dL (ref 13.0–17.0)
MCH: 31.3 pg (ref 26.0–34.0)
MCHC: 33.7 g/dL (ref 30.0–36.0)
MCV: 92.9 fL (ref 80.0–100.0)
Platelets: 210 10*3/uL (ref 150–400)
RBC: 4.34 MIL/uL (ref 4.22–5.81)
RDW: 12.2 % (ref 11.5–15.5)
WBC: 10.3 10*3/uL (ref 4.0–10.5)
nRBC: 0 % (ref 0.0–0.2)

## 2023-06-01 LAB — LIPASE, BLOOD: Lipase: 206 U/L — ABNORMAL HIGH (ref 11–51)

## 2023-06-01 MED ORDER — TAMSULOSIN HCL 0.4 MG PO CAPS: 0.4000 mg | ORAL_CAPSULE | Freq: Every day | ORAL | 0 refills | Status: AC

## 2023-06-01 MED ORDER — OXYCODONE HCL 5 MG PO TABS
5.0000 mg | ORAL_TABLET | Freq: Once | ORAL | Status: AC
Start: 2023-06-01 — End: 2023-06-01
  Administered 2023-06-01: 5 mg via ORAL
  Filled 2023-06-01: qty 1

## 2023-06-01 MED ORDER — OXYCODONE HCL 5 MG PO TABS: 5.0000 mg | ORAL_TABLET | ORAL | 0 refills | Status: DC | PRN

## 2023-06-01 NOTE — ED Provider Notes (Signed)
[facility] EMERGENCY [facility] Emergency Department Provider Note MRN:  [id number]  Arrival date & time: 06/01/23     Chief Complaint   Flank Pain   History of Present Illness   Ryan Ferguson is a 72 y.o. year-old male with a history of diabetes, hypertension, kidney stone presenting to the ED with chief complaint of flank pain.  Right flank pain this evening, migrating and now located in the right lower quadrant.  Associated with hematuria.  Review of Systems  A thorough review of systems was obtained and all systems are negative except as noted in the HPI and PMH.   Patient's Health History    Past Medical History:  Diagnosis Date   Chronic kidney disease    Colon polyps    Diabetes mellitus without complication (HCC)    does not check cbg daily   Hypertension    Hyperthyroidism    Kidney stones     Past Surgical History:  Procedure Laterality Date   APPENDECTOMY     age 38   CORONARY STENT INTERVENTION N/A 01/09/2021   Procedure: CORONARY STENT INTERVENTION;  Surgeon: Lennette Bihari, MD;  Location: [facility] INVASIVE CV LAB;  Service: Cardiovascular;  Laterality: N/A;   CYSTOSCOPY     LEFT HEART CATH AND CORONARY ANGIOGRAPHY N/A 01/09/2021   Procedure: LEFT HEART CATH AND CORONARY ANGIOGRAPHY;  Surgeon: Lennette Bihari, MD;  Location: [facility] INVASIVE CV LAB;  Service: Cardiovascular;  Laterality: N/A;   LITHOTRIPSY     4-5 times    Family History  Problem Relation Age of Onset   Heart disease Mother    Thyroid disease Mother    High Cholesterol Father    Hypertension Father    Varicose Veins Father    Pancreatic cancer Father    Kidney disease Father    Lupus Sister    Diabetes Maternal Grandmother    Heart disease Maternal Grandfather    Lung cancer Maternal Grandfather    Heart disease Paternal Grandmother    Colon cancer Neg Hx    Esophageal cancer Neg Hx    Stomach cancer Neg Hx    Liver disease Neg Hx     Social History   Socioeconomic History    Marital status: Married    Spouse name: Not on file   Number of children: 3   Years of education: Not on file   Highest education level: Not on file  Occupational History   Not on file  Tobacco Use   Smoking status: Former    Current packs/day: 0.00    Types: Cigarettes    Quit date: 10/25/2010    Years since quitting: 12.6   Smokeless tobacco: Never  Vaping Use   Vaping status: Never Used  Substance and Sexual Activity   Alcohol use: Yes    Comment: occasional   Drug use: No   Sexual activity: Not on file  Other Topics Concern   Not on file  Social History Narrative   Not on file   Social Determinants of Health   Financial Resource Strain: Low Risk  (11/17/2022)   Overall Financial Resource Strain (CARDIA)    Difficulty of Paying Living Expenses: Not hard at all  Food Insecurity: No Food Insecurity (11/17/2022)   Hunger Vital Sign    Worried About Running Out of Food in the Last Year: Never true    Ran Out of Food in the Last Year: Never true  Transportation Needs: No Transportation Needs (11/17/2022)   PRAPARE -  Administrator, Civil Service (Medical): No    Lack of Transportation (Non-Medical): No  Physical Activity: Sufficiently Active (11/17/2022)   Exercise Vital Sign    Days of Exercise per Week: 7 days    Minutes of Exercise per Session: 30 min  Stress: No Stress Concern Present (11/17/2022)   [employer] of Occupational Health - Occupational Stress Questionnaire    Feeling of Stress : Not at all  Social Connections: Moderately Isolated (11/17/2022)   Social Connection and Isolation Panel [NHANES]    Frequency of Communication with Friends and Family: More than three times a week    Frequency of Social Gatherings with Friends and Family: More than three times a week    Attends Religious Services: Never    Database administrator or Organizations: No    Attends Banker Meetings: Never    Marital Status: Married  Catering manager  Violence: Not At Risk (11/17/2022)   Humiliation, Afraid, Rape, and Kick questionnaire    Fear of Current or Ex-Partner: No    Emotionally Abused: No    Physically Abused: No    Sexually Abused: No     Physical Exam   Vitals:   06/01/23 0330 06/01/23 0345  BP: (!) 184/93 (!) 190/84  Pulse: 62 (!) 58  Resp:    Temp:    SpO2: 95% 96%    CONSTITUTIONAL: Well-appearing, NAD NEURO/PSYCH:  Alert and oriented x 3, no focal deficits EYES:  eyes equal and reactive ENT/NECK:  no LAD, no JVD CARDIO: Regular rate, well-perfused, normal S1 and S2 PULM:  CTAB no wheezing or rhonchi GI/GU:  non-distended, non-tender MSK/SPINE:  No gross deformities, no edema SKIN:  no rash, atraumatic   *Additional and/or pertinent findings included in MDM below  Diagnostic and Interventional Summary    EKG Interpretation Date/Time:    Ventricular Rate:    PR Interval:    QRS Duration:    QT Interval:    QTC Calculation:   R Axis:      Text Interpretation:         Labs Reviewed  URINALYSIS, ROUTINE W REFLEX MICROSCOPIC - Abnormal; Notable for the following components:      Result Value   Glucose, UA 100 (*)    Hgb urine dipstick MODERATE (*)    Protein, ur 30 (*)    Bacteria, UA RARE (*)    All other components within normal limits  COMPREHENSIVE METABOLIC PANEL - Abnormal; Notable for the following components:   Glucose, Bld 252 (*)    AST 13 (*)    All other components within normal limits  LIPASE, BLOOD - Abnormal; Notable for the following components:   Lipase 206 (*)    All other components within normal limits  CBC    CT RENAL STONE STUDY  Final Result      Medications  oxyCODONE (Oxy IR/ROXICODONE) immediate release tablet 5 mg (5 mg Oral Given 06/01/23 0340)     Procedures  /  Critical Care Procedures  ED Course and Medical Decision Making  Initial Impression and Ddx Well-appearing with normal vital signs, abdomen soft and nontender.  Suspicious for kidney stone  versus less likely appendicitis, colitis, diverticulitis  Past medical/surgical history that increases complexity of ED encounter: Kidney stone  Interpretation of Diagnostics I personally reviewed the laboratory assessment and my interpretation is as follows: No significant blood count or electrolyte disturbance, urinalysis unremarkable  CT confirms 5 mm stone  Patient Reassessment and  Ultimate Disposition/Management     Patient's pain is well-controlled, otherwise reassuring workup, appropriate for discharge.  Patient management required discussion with the following services or consulting groups:  None  Complexity of Problems Addressed Acute illness or injury that poses threat of life of bodily function  Additional Data Reviewed and Analyzed Further history obtained from: Further history from spouse/family member  Additional Factors Impacting ED Encounter Risk Prescriptions and Consideration of hospitalization  Elmer Sow. Pilar Plate, MD [facility] Emergency Medicine [facility] [email or web address]  Final Clinical Impressions(s) / ED Diagnoses     ICD-10-CM   1. Kidney stone  N20.0       ED Discharge Orders          Ordered    tamsulosin (FLOMAX) 0.4 MG CAPS capsule  Daily        06/01/23 0359    oxyCODONE (ROXICODONE) 5 MG immediate release tablet  Every 4 hours PRN        06/01/23 0359             Discharge Instructions Discussed with and Provided to Patient:    Discharge Instructions      You were evaluated in the Emergency Department and after careful evaluation, we did not find any emergent condition requiring admission or further testing in the hospital.  Your exam/testing today is overall reassuring.  Symptoms seem to be due to a kidney stone.  Take the Flomax daily to help you pass the stone.  Recommend Tylenol 1000 mg every 4-6 hours and/or Motrin 600 mg every 4-6 hours for pain.  You can use the oxycodone medication for more significant  pain.  Follow-up with urology.  Please return to the Emergency Department if you experience any worsening of your condition.   Thank you for allowing [place] to be a part of your care.      Sabas Sous, MD 06/01/23 [phone]

## 2023-06-01 NOTE — Discharge Instructions (Signed)
You were evaluated in the Emergency Department and after careful evaluation, we did not find any emergent condition requiring admission or further testing in the hospital.  Your exam/testing today is overall reassuring.  Symptoms seem to be due to a kidney stone.  Take the Flomax daily to help you pass the stone.  Recommend Tylenol 1000 mg every 4-6 hours and/or Motrin 600 mg every 4-6 hours for pain.  You can use the oxycodone medication for more significant pain.  Follow-up with urology.  Please return to the Emergency Department if you experience any worsening of your condition.   Thank you for allowing [place] to be a part of your care.

## 2023-06-12 ENCOUNTER — Encounter: Payer: Self-pay | Admitting: Pharmacist

## 2023-06-12 NOTE — Progress Notes (Signed)
Pharmacy Quality Measure Review  This patient is appearing on a report for being at risk of failing the adherence measure for cholesterol (statin) medications this calendar year.   Medication: pitavastatin 1 mg Last fill date: 8/16 for 30 day supply  Per PCP note on 11/7 patient was instructed to stop pitavastatin with plan to try Repatha. Since pitavastatin was stopped, no further action needed at this time.  Jarrett Ables, PharmD PGY-1 Pharmacy Resident

## 2023-06-16 ENCOUNTER — Other Ambulatory Visit: Payer: Self-pay | Admitting: Internal Medicine

## 2023-06-17 ENCOUNTER — Telehealth: Payer: Self-pay | Admitting: *Deleted

## 2023-06-17 NOTE — Progress Notes (Signed)
Transition Care Management Unsuccessful Follow-up Telephone Call  Date of discharge and from where:  [facility]  06/01/2023  Attempts:  1st Attempt  Reason for unsuccessful TCM follow-up call:  No answer/busy

## 2023-06-28 ENCOUNTER — Encounter: Payer: Self-pay | Admitting: Internal Medicine

## 2023-06-28 ENCOUNTER — Ambulatory Visit: Payer: PPO | Admitting: Internal Medicine

## 2023-06-28 VITALS — BP 140/78 | HR 56 | Temp 97.6°F | Ht 65.0 in | Wt 149.0 lb

## 2023-06-28 DIAGNOSIS — E118 Type 2 diabetes mellitus with unspecified complications: Secondary | ICD-10-CM

## 2023-06-28 DIAGNOSIS — H918X3 Other specified hearing loss, bilateral: Secondary | ICD-10-CM

## 2023-06-28 DIAGNOSIS — R413 Other amnesia: Secondary | ICD-10-CM

## 2023-06-28 DIAGNOSIS — N320 Bladder-neck obstruction: Secondary | ICD-10-CM

## 2023-06-28 DIAGNOSIS — Z7984 Long term (current) use of oral hypoglycemic drugs: Secondary | ICD-10-CM

## 2023-06-28 DIAGNOSIS — H9190 Unspecified hearing loss, unspecified ear: Secondary | ICD-10-CM | POA: Insufficient documentation

## 2023-06-28 DIAGNOSIS — N182 Chronic kidney disease, stage 2 (mild): Secondary | ICD-10-CM | POA: Diagnosis not present

## 2023-06-28 DIAGNOSIS — G8929 Other chronic pain: Secondary | ICD-10-CM

## 2023-06-28 DIAGNOSIS — M5441 Lumbago with sciatica, right side: Secondary | ICD-10-CM

## 2023-06-28 LAB — COMPREHENSIVE METABOLIC PANEL
ALT: 15 U/L (ref 0–53)
AST: 14 U/L (ref 0–37)
Albumin: 4.4 g/dL (ref 3.5–5.2)
Alkaline Phosphatase: 49 U/L (ref 39–117)
BUN: 27 mg/dL — ABNORMAL HIGH (ref 6–23)
CO2: 28 meq/L (ref 19–32)
Calcium: 10.1 mg/dL (ref 8.4–10.5)
Chloride: 100 meq/L (ref 96–112)
Creatinine, Ser: 1.14 mg/dL (ref 0.40–1.50)
GFR: 64.09 mL/min (ref >60.00)
Glucose, Bld: 170 mg/dL — ABNORMAL HIGH (ref 70–99)
Potassium: 3.9 meq/L (ref 3.5–5.1)
Sodium: 138 meq/L (ref 135–145)
Total Bilirubin: 0.6 mg/dL (ref 0.2–1.2)
Total Protein: 6.8 g/dL (ref 6.0–8.3)

## 2023-06-28 LAB — T4, FREE: Free T4: 0.72 ng/dL (ref 0.60–1.60)

## 2023-06-28 LAB — VITAMIN B12: Vitamin B-12: 280 pg/mL (ref 211–911)

## 2023-06-28 LAB — TSH: TSH: 5.42 u[IU]/mL (ref 0.35–5.50)

## 2023-06-28 LAB — HEMOGLOBIN A1C: Hgb A1c MFr Bld: 7.5 % — ABNORMAL HIGH (ref 4.6–6.5)

## 2023-06-28 MED ORDER — DAPAGLIFLOZIN PROPANEDIOL 5 MG PO TABS: 5.0000 mg | ORAL_TABLET | Freq: Every day | ORAL | 5 refills | Status: DC

## 2023-06-28 NOTE — Assessment & Plan Note (Signed)
Worse ?Re-start Flomax ?Check UA ?

## 2023-06-28 NOTE — Assessment & Plan Note (Signed)
 On Olmesartan Hydrate well: He spends a lot of time [place] outside in the heat.

## 2023-06-28 NOTE — Assessment & Plan Note (Addendum)
MSK - better 

## 2023-06-28 NOTE — Assessment & Plan Note (Addendum)
 Discussed the need for a hearing test/possibly hearing aids

## 2023-06-28 NOTE — Assessment & Plan Note (Addendum)
 Stable  Neurology ref was offered  Discussed the need for a hearing test/possibly hearing aids

## 2023-06-28 NOTE — Assessment & Plan Note (Addendum)
 1

## 2023-06-28 NOTE — Progress Notes (Signed)
 Subjective:  Patient ID: Ryan Ferguson, male    DOB: [birth year]  Age: 73 y.o. MRN: [id number]  CC: Medical Management of Chronic Issues (2 month f/u)   HPI ASSER LUCENA presents for DM, CAD, HTN Phillippe likes to eat sweets CBG 87-175  Cardiology and Urology appointments are pending  The patient is here with his wife who helps with history.  They are complaining of decreased hearing  Outpatient Medications Prior to Visit  Medication Sig Dispense Refill   aspirin  EC 81 MG tablet Take 1 tablet (81 mg total) by mouth daily. 90 tablet 3   cholecalciferol (VITAMIN D) 1000 UNITS tablet Take 1,000 Units by mouth daily.     donepezil  (ARICEPT ) 5 MG tablet Take 1 tablet (5 mg total) by mouth at bedtime. 30 tablet 5   ezetimibe  (ZETIA ) 10 MG tablet Take 1 tablet (10 mg total) by mouth daily. 90 tablet 0   famotidine  (PEPCID ) 40 MG tablet Take 1 tablet by mouth once daily 90 tablet 0   levothyroxine  (SYNTHROID ) 112 MCG tablet TAKE 1 TABLET BY MOUTH IN THE MORNING 90 tablet 1   LIVALO  1 MG TABS Take 1 tablet by mouth once daily 30 tablet 5   metFORMIN  (GLUCOPHAGE ) 1000 MG tablet Take 1 tablet by mouth twice daily 180 tablet 0   nitroGLYCERIN  (NITROSTAT ) 0.4 MG SL tablet DISSOLVE ONE TABLET UNDER THE TONGUE EVERY 5 MINUTES AS NEEDED FOR CHEST PAIN.  DO NOT EXCEED A TOTAL OF 3 DOSES IN 15 MINUTES 50 tablet 0   olmesartan  (BENICAR ) 20 MG tablet Take 1 tablet (20 mg total) by mouth 2 (two) times daily. 180 tablet 3   ondansetron  (ZOFRAN  ODT) 4 MG disintegrating tablet Take 1 tablet (4 mg total) by mouth every 8 (eight) hours as needed for nausea or vomiting. 20 tablet 0   ONE TOUCH ULTRA TEST test strip USE TEST STRIPS TO TEST BLOOD SUGAR ONCE DAILY  4   tamsulosin  (FLOMAX ) 0.4 MG CAPS capsule Take 1 capsule (0.4 mg total) by mouth daily. 7 capsule 0   zolpidem  (AMBIEN ) 10 MG tablet Take 0.5-1 tablets (5-10 mg total) by mouth at bedtime as needed for sleep. 30 tablet 3   oxyCODONE  (ROXICODONE ) 5  MG immediate release tablet Take 1 tablet (5 mg total) by mouth every 4 (four) hours as needed for severe pain (pain score 7-10). 8 tablet 0   No facility-administered medications prior to visit.    ROS: Review of Systems  Constitutional:  Negative for appetite change, fatigue and unexpected weight change.  HENT:  Positive for hearing loss. Negative for congestion, nosebleeds, sneezing, sore throat and trouble swallowing.   Eyes:  Negative for itching and visual disturbance.  Respiratory:  Negative for cough.   Cardiovascular:  Negative for chest pain, palpitations and leg swelling.  Gastrointestinal:  Negative for abdominal distention, blood in stool, diarrhea and nausea.  Genitourinary:  Negative for frequency and hematuria.  Musculoskeletal:  Negative for back pain, gait problem, joint swelling and neck pain.  Skin:  Negative for rash.  Neurological:  Negative for dizziness, tremors, speech difficulty and weakness.  Psychiatric/Behavioral:  Positive for decreased concentration. Negative for agitation, dysphoric mood, sleep disturbance and suicidal ideas. The patient is not nervous/anxious.     Objective:  BP (!) 140/78 (BP Location: Left Arm, Patient Position: Sitting, Cuff Size: Large)   Pulse (!) 56   Temp 97.6 F (36.4 C) (Temporal)   Ht 5' 5 (1.651 m)   Wt  149 lb (67.6 kg)   SpO2 95%   BMI 24.79 kg/m   BP Readings from Last 3 Encounters:  07/01/23 (!) 178/88  06/28/23 (!) 140/78  06/01/23 (!) 189/90    Wt Readings from Last 3 Encounters:  07/01/23 149 lb (67.6 kg)  06/28/23 149 lb (67.6 kg)  04/28/23 147 lb (66.7 kg)    Physical Exam Constitutional:      General: He is not in acute distress.    Appearance: Normal appearance. He is well-developed.     Comments: NAD  Eyes:     Conjunctiva/sclera: Conjunctivae normal.     Pupils: Pupils are equal, round, and reactive to light.  Neck:     Thyroid : No thyromegaly.     Vascular: No JVD.  Cardiovascular:      Rate and Rhythm: Normal rate and regular rhythm.     Heart sounds: Normal heart sounds. No murmur heard.    No friction rub. No gallop.  Pulmonary:     Effort: Pulmonary effort is normal. No respiratory distress.     Breath sounds: Normal breath sounds. No wheezing or rales.  Chest:     Chest wall: No tenderness.  Abdominal:     General: Bowel sounds are normal. There is no distension.     Palpations: Abdomen is soft. There is no mass.     Tenderness: There is no abdominal tenderness. There is no guarding or rebound.  Musculoskeletal:        General: No tenderness. Normal range of motion.     Cervical back: Normal range of motion.  Lymphadenopathy:     Cervical: No cervical adenopathy.  Skin:    General: Skin is warm and dry.     Findings: No rash.  Neurological:     Mental Status: He is alert and oriented to person, place, and time. Mental status is at baseline.     Cranial Nerves: No cranial nerve deficit.     Motor: No abnormal muscle tone.     Coordination: Coordination normal.     Gait: Gait normal.     Deep Tendon Reflexes: Reflexes are normal and symmetric.  Psychiatric:        Behavior: Behavior normal.        Thought Content: Thought content normal.        Judgment: Judgment normal.   No wax in both ears  Lab Results  Component Value Date   WBC 10.3 06/01/2023   HGB 13.6 06/01/2023   HCT 40.3 06/01/2023   PLT 210 06/01/2023   GLUCOSE 170 (H) 06/28/2023   CHOL 157 09/06/2022   TRIG 93.0 09/06/2022   HDL 48.90 09/06/2022   LDLCALC 89 09/06/2022   ALT 15 06/28/2023   AST 14 06/28/2023   NA 138 06/28/2023   K 3.9 06/28/2023   CL 100 06/28/2023   CREATININE 1.14 06/28/2023   BUN 27 (H) 06/28/2023   CO2 28 06/28/2023   TSH 5.42 06/28/2023   PSA 2.17 03/26/2020   HGBA1C 7.5 (H) 06/28/2023   MICROALBUR 2.4 (H) 09/06/2022    No results found.  Assessment & Plan:   Problem List Items Addressed This Visit     DM (diabetes mellitus), type 2 with  complications (HCC) - Primary   1/      Relevant Medications   dapagliflozin  propanediol (FARXIGA ) 5 MG TABS tablet   Other Relevant Orders   Comprehensive metabolic panel (Completed)   T4, free (Completed)   TSH (Completed)   Hemoglobin  A1c (Completed)   Vitamin B12 (Completed)   CRI (chronic renal insufficiency), stage 2 (mild)   On Olmesartan  Hydrate well: He spends a lot of time in [place] the woods working outside in the heat.      Relevant Orders   Comprehensive metabolic panel (Completed)   T4, free (Completed)   TSH (Completed)   Hemoglobin A1c (Completed)   Vitamin B12 (Completed)   Bladder neck obstruction   Worse Re-start Flomax  Check UA      Relevant Orders   Comprehensive metabolic panel (Completed)   T4, free (Completed)   TSH (Completed)   Hemoglobin A1c (Completed)   Vitamin B12 (Completed)   Low back pain   MSK -better      Memory loss   Stable  Neurology ref was offered  Discussed the need for a hearing test/possibly hearing aids      Hearing loss   Discussed the need for a hearing test/possibly hearing aids         Meds ordered this encounter  Medications   dapagliflozin  propanediol (FARXIGA ) 5 MG TABS tablet    Sig: Take 1 tablet (5 mg total) by mouth daily before breakfast.    Dispense:  30 tablet    Refill:  5      Follow-up: Return in about 3 months (around 09/26/2023) for a follow-up visit.  Marolyn Noel, MD

## 2023-07-01 ENCOUNTER — Encounter (HOSPITAL_BASED_OUTPATIENT_CLINIC_OR_DEPARTMENT_OTHER): Payer: Self-pay | Admitting: Cardiology

## 2023-07-01 ENCOUNTER — Ambulatory Visit (HOSPITAL_BASED_OUTPATIENT_CLINIC_OR_DEPARTMENT_OTHER): Payer: PPO | Admitting: Cardiology

## 2023-07-01 VITALS — BP 178/88 | HR 79 | Resp 16 | Ht 65.0 in | Wt 149.0 lb

## 2023-07-01 DIAGNOSIS — I25118 Atherosclerotic heart disease of native coronary artery with other forms of angina pectoris: Secondary | ICD-10-CM

## 2023-07-01 DIAGNOSIS — R011 Cardiac murmur, unspecified: Secondary | ICD-10-CM

## 2023-07-01 DIAGNOSIS — E118 Type 2 diabetes mellitus with unspecified complications: Secondary | ICD-10-CM | POA: Diagnosis not present

## 2023-07-01 DIAGNOSIS — E7800 Pure hypercholesterolemia, unspecified: Secondary | ICD-10-CM | POA: Diagnosis not present

## 2023-07-01 DIAGNOSIS — I10 Essential (primary) hypertension: Secondary | ICD-10-CM

## 2023-07-01 NOTE — Progress Notes (Signed)
 Cardiology Office Note:  .   Date:  07/01/2023  ID:  Ryan Ferguson, DOB Dec 18, 1950, MRN [id number] PCP: Garald Karlynn GAILS, MD  [facility] Providers Cardiologist:  Oneil Parchment, MD     History of Present Illness: Ryan Ferguson   Ryan Ferguson is a 73 y.o. male Discussed with the use of AI scribe   History of Present Illness   The patient, a 73 year old with a history of coronary artery disease, underwent cardiac catheterization in July 2022, resulting in the placement of two drug-eluting stents in the diagonal artery. The ejection fraction at the time was normal at 55%. Subsequent nuclear stress test in May 2023 was low risk with no ischemia. The patient has been under lipid management after failing Crestor  20mg , and was transitioned to pitavastatin  and Zetia  due to statin intolerance.  The patient has a history of paroxysmal atrial tachycardia, as evidenced by a Zio monitor in 2022, but no evidence of atrial fibrillation. He reports intermittent palpitations when laying on the left side. In December 2024, the patient visited the emergency department for a kidney stone and has had prior elevated lipase levels.  The patient's hypertension has been well-controlled with Benicar  HCT and amlodipine  5mg  once a day. He also has hypothyroidism, managed by his primary care physician with Synthroid , and diabetes with chronic kidney disease, stage 2. The last hemoglobin A1c was 7.5, and creatinine was 1.14. The patient is also being treated with olmesartan  20mg  twice a day for hypertension.  The patient is on Zetia  10mg  a day with pitavastatin  1mg  a day for cholesterol management, and takes aspirin  81mg  a day. He is also on Farxiga  5mg  a day for diabetes and cardiovascular health, and donepezil  5mg  a day for memory impairment. The patient is a former smoker, having quit in 2012.  The patient reports occasional chest discomfort, described as intermittent and transient. The patient is also concerned about  calcification, but understands that current treatments are aimed at stabilizing this condition.          ROS: No further chest pain  Studies Reviewed: Ryan Ferguson   EKG Interpretation Date/Time:  Friday July 01 2023 09:02:58 EST Ventricular Rate:  61 PR Interval:  154 QRS Duration:  88 QT Interval:  414 QTC Calculation: 416 R Axis:   46  Text Interpretation: Normal sinus rhythm Nonspecific T wave abnormality When compared with ECG of 23-Mar-2021 19:46, Premature atrial complexes are no longer Present Vent. rate has decreased BY  40 BPM ST no longer depressed in Inferior leads Confirmed by Parchment Oneil ([id number]) on 07/01/2023 9:16:37 AM    Results   LABS HbA1c: 7.5 Creatinine: 1.14 LDL: 89  DIAGNOSTIC Cardiac catheterization: DES to diagonal x2, nonobstructive coronary artery (12/2020) Nuclear stress test: Low risk, no ischemia (10/2021) Zio monitor: Short runs of paroxysmal atrial tachycardia, no atrial fibrillation (2022) EKG: Normal     Risk Assessment/Calculations:           Physical Exam:   VS:  BP (!) 178/88 (BP Location: Left Arm, Patient Position: Sitting, Cuff Size: Normal)   Pulse 79   Resp 16   Ht 5' 5 (1.651 m)   Wt 149 lb (67.6 kg)   SpO2 98%   BMI 24.79 kg/m    Wt Readings from Last 3 Encounters:  07/01/23 149 lb (67.6 kg)  06/28/23 149 lb (67.6 kg)  04/28/23 147 lb (66.7 kg)    GEN: Well nourished, well developed in no acute distress NECK: No JVD; No  carotid bruits CARDIAC: RRR, 2/6 Systolic murmur, no rubs, no gallops RESPIRATORY:  Clear to auscultation without rales, wheezing or rhonchi  ABDOMEN: Soft, non-tender, non-distended EXTREMITIES:  No edema; No deformity   ASSESSMENT AND PLAN: .    Assessment and Plan    Coronary Artery Disease (CAD) Follow-up for CAD. Cardiac catheterization in July 2022 with placement of two drug-eluting stents in the diagonal artery. Ejection fraction was 55%. Subsequent nuclear stress test in May 2023 was low risk  with no ischemia. Currently experiencing intermittent chest pain, but stress test results are reassuring. Cholesterol management with pitavastatin  and ezetimibe  has been effective, with LDL at 89 mg/dL, though target is less than 70 mg/dL. - Continue pitavastatin  1 mg daily, ezetimibe  10 mg daily, aspirin  81 mg daily - Encourage regular exercise  Heart murmur -Systolic murmur.  We will check an echocardiogram.  May be aortic valve.  Paroxysmal Atrial Tachycardia Previous Zio monitor in 2022 showed short runs of paroxysmal atrial tachycardia but no atrial fibrillation. Reports intermittent palpitations when lying on the left side. - Monitor for symptom changes - No immediate intervention unless symptoms worsen  Hypertension Hypertension is well-controlled normally with current medication regimen. Elevated today - Continue Olmesartan  20 mg twice daily, amlodipine  5 mg daily - Monitor blood pressure regularly  Diabetes with Chronic Kidney Disease, Stage 2 Diabetes management is ongoing with a recent hemoglobin A1c of 7.5%. Chronic kidney disease stage 2 is being monitored. Current medications include Farxiga  for both diabetes and cardiovascular health. - Continue Farxiga  5 mg daily - Monitor kidney function and blood glucose levels regularly  Hypothyroidism Hypothyroidism is managed by PCP with Synthroid . - Continue Synthroid  as prescribed by PCP  General Health Maintenance Former smoker, quit in 2012. Regular follow-ups with PCP and cardiologist. - Encourage continued smoking cessation - Maintain regular follow-up appointments with PCP and cardiologist  Follow-up - Schedule echocardiogram, we will follow-up with results of this - PRN follow-up for any new or worsening symptoms.              Signed, Oneil Parchment, MD

## 2023-07-01 NOTE — Patient Instructions (Signed)
 Medication Instructions:  Your physician recommends that you continue on your current medications as directed. Please refer to the Current Medication list given to you today.   Labwork: NONE   Testing/Procedures: Your physician has requested that you have an echocardiogram. Echocardiography is a painless test that uses sound waves to create images of your heart. It provides your doctor with information about the size and shape of your heart and how well your heart's chambers and valves are working. This procedure takes approximately one hour. There are no restrictions for this procedure. Please do NOT wear cologne, perfume, aftershave, or lotions (deodorant is allowed). Please arrive 15 minutes prior to your appointment time.  Please note: We ask at that you not bring children with you during ultrasound (echo/ vascular) testing. Due to room size and safety concerns, children are not allowed in the ultrasound rooms during exams. Our front office staff cannot provide observation of children in our lobby area while testing is being conducted. An adult accompanying a patient to their appointment will only be allowed in the ultrasound room at the discretion of the ultrasound technician under special circumstances. We apologize for any inconvenience.  Follow-Up: AS NEEDED   Any Other Special Instructions Will Be Listed Below (If Applicable). Echocardiogram An echocardiogram is a test that uses sound waves to make images of your heart. This way of making images is often called ultrasound. The images from this test can help find out many things about your heart, including: The size and shape of your heart. The strength of your heart muscle and how well it's working. The size, thickness, and movement of your heart's walls. How your heart valves are working. Problems such as: A tumor or a growth from an infection around the heart valves. Areas of heart muscle that aren't working well because of poor  blood flow or injury from a heart attack. An aneurysm. This is a weak or damaged part of an artery wall. An artery is a blood vessel. Tell a health care provider about: Any allergies you have. All medicines you're taking, including vitamins, herbs, eye drops, creams, and over-the-counter medicines. Any bleeding problems you have. Any surgeries you've had. Any medical problems you have. Whether you're pregnant or may be pregnant. What are the risks? Your health care provider will talk with you about risks. These may include an allergic reaction to IV dye that may be used during the test. What happens before the test? You don't need to do anything to get ready for this test. You may eat and drink normally. What happens during the test?  You'll take off your clothes from the waist up and put on a hospital gown. Sticky patches called electrodes may be placed on your chest. These will be connected to a machine that monitors your heart rate and rhythm. You'll lie down on a table for the exam. A wand covered in gel will be moved over your chest. Sound waves from the wand will go to your heart and bounce back--or echo back. The sound waves will go to a computer that uses them to make images of your heart. The images can be viewed on a monitor. The images will also be recorded on the computer so your provider can look at them later. You may be asked to change positions or hold your breath for a short time. This makes it easier to get different views or better views of your heart. In some cases, you may be given a dye through  an IV. The IV is put into one of your veins. This dye can make the areas of your heart easier to see. The procedure may vary among providers and hospitals. What can I expect after the test? You may return to your normal diet, activities, and medicines unless your provider tells you not to. If an IV was placed for the test, it will be removed. It's up to you to get the results  of your test. Ask your provider, or the department that's doing the test, when your results will be ready. This information is not intended to replace advice given to you by your health care provider. Make sure you discuss any questions you have with your health care provider. Document Revised: 08/06/2022 Document Reviewed: 08/06/2022 Elsevier Patient Education  2024 [employer].

## 2023-07-04 ENCOUNTER — Encounter: Payer: Self-pay | Admitting: Internal Medicine

## 2023-07-04 DIAGNOSIS — N200 Calculus of kidney: Secondary | ICD-10-CM | POA: Diagnosis not present

## 2023-07-04 DIAGNOSIS — N401 Enlarged prostate with lower urinary tract symptoms: Secondary | ICD-10-CM | POA: Diagnosis not present

## 2023-07-04 DIAGNOSIS — R351 Nocturia: Secondary | ICD-10-CM | POA: Diagnosis not present

## 2023-07-05 ENCOUNTER — Encounter: Payer: Self-pay | Admitting: Internal Medicine

## 2023-07-06 ENCOUNTER — Other Ambulatory Visit: Payer: Self-pay | Admitting: Internal Medicine

## 2023-07-06 ENCOUNTER — Other Ambulatory Visit: Payer: Self-pay

## 2023-07-14 ENCOUNTER — Other Ambulatory Visit: Payer: Self-pay | Admitting: Internal Medicine

## 2023-07-14 MED ORDER — OLMESARTAN MEDOXOMIL 20 MG PO TABS: 20.0000 mg | ORAL_TABLET | Freq: Two times a day (BID) | ORAL | 3 refills | Status: AC

## 2023-07-19 ENCOUNTER — Ambulatory Visit (INDEPENDENT_AMBULATORY_CARE_PROVIDER_SITE_OTHER): Payer: PPO

## 2023-07-19 DIAGNOSIS — R011 Cardiac murmur, unspecified: Secondary | ICD-10-CM | POA: Diagnosis not present

## 2023-07-19 LAB — ECHOCARDIOGRAM COMPLETE
Area-P 1/2: 3.08 cm2
S' Lateral: 2.8 cm

## 2023-07-20 ENCOUNTER — Encounter (HOSPITAL_BASED_OUTPATIENT_CLINIC_OR_DEPARTMENT_OTHER): Payer: Self-pay | Admitting: Cardiology

## 2023-07-20 DIAGNOSIS — N200 Calculus of kidney: Secondary | ICD-10-CM | POA: Diagnosis not present

## 2023-08-18 ENCOUNTER — Other Ambulatory Visit: Payer: Self-pay | Admitting: Internal Medicine

## 2023-08-24 DIAGNOSIS — N200 Calculus of kidney: Secondary | ICD-10-CM | POA: Diagnosis not present

## 2023-08-24 DIAGNOSIS — N201 Calculus of ureter: Secondary | ICD-10-CM | POA: Diagnosis not present

## 2023-08-30 DIAGNOSIS — N201 Calculus of ureter: Secondary | ICD-10-CM | POA: Diagnosis not present

## 2023-08-31 ENCOUNTER — Other Ambulatory Visit: Payer: Self-pay | Admitting: Urology

## 2023-09-02 ENCOUNTER — Encounter (HOSPITAL_COMMUNITY): Payer: Self-pay | Admitting: Urology

## 2023-09-02 NOTE — Progress Notes (Signed)
 Spoke w/ via phone for pre-op interview---Ryan Ferguson Mathis Fare not available) Lab needs dos----KUB             COVID test -----patient states asymptomatic no test needed Arrive at -------0600 NPO after MN NO Solid Food.  Clear liquids from MN until--- Med rec completed. Pt aware to hold ASA/NSAIDs and supplements per [facility] protocol.  Medications to take morning of surgery -----none Diabetic/Weight loss medication ----- No Alcohol or recreational drugs for 24 hours/Tobacco products for 6 hours ---- Patient instructed to bring blue lithotripsy folder, photo id and insurance card day of surgery. Patient aware to have Driver (ride ) / caregiver for 24 hours after surgery -----Ryan Ferguson Patient Special Instructions ----- Pre-Op special Instructions ----- take laxative of choice day before procedure Patient verbalized understanding of instructions that were given at this phone interview. Patient denies shortness of breath, chest pain, fever, cough at this phone interview.

## 2023-09-05 ENCOUNTER — Encounter (HOSPITAL_COMMUNITY)
Admission: RE | Disposition: A | Discharge status: Home / Self Care | Payer: Self-pay | Source: Ambulatory Visit | Attending: Urology

## 2023-09-05 ENCOUNTER — Other Ambulatory Visit: Payer: Self-pay

## 2023-09-05 ENCOUNTER — Ambulatory Visit (HOSPITAL_COMMUNITY)

## 2023-09-05 ENCOUNTER — Encounter (HOSPITAL_COMMUNITY): Payer: Self-pay | Admitting: Urology

## 2023-09-05 ENCOUNTER — Ambulatory Visit (HOSPITAL_COMMUNITY)
Admission: RE | Admit: 2023-09-05 | Discharge: 2023-09-05 | Disposition: A | Discharge status: Home / Self Care | Source: Ambulatory Visit | Attending: Urology | Admitting: Urology

## 2023-09-05 DIAGNOSIS — Z01818 Encounter for other preprocedural examination: Secondary | ICD-10-CM | POA: Diagnosis not present

## 2023-09-05 DIAGNOSIS — N200 Calculus of kidney: Secondary | ICD-10-CM

## 2023-09-05 DIAGNOSIS — N201 Calculus of ureter: Secondary | ICD-10-CM | POA: Insufficient documentation

## 2023-09-05 HISTORY — PX: EXTRACORPOREAL SHOCK WAVE LITHOTRIPSY: SHX1557

## 2023-09-05 LAB — GLUCOSE, CAPILLARY
Glucose-Capillary: 139 mg/dL — ABNORMAL HIGH (ref 70–99)
Glucose-Capillary: 111 mg/dL — ABNORMAL HIGH (ref 70–99)

## 2023-09-05 SURGERY — LITHOTRIPSY, ESWL
Anesthesia: LOCAL | Laterality: Right

## 2023-09-05 MED ORDER — DIPHENHYDRAMINE HCL 25 MG PO CAPS
ORAL_CAPSULE | ORAL | Status: AC
  Administered 2023-09-05: 25 mg via ORAL
  Filled 2023-09-05: qty 1

## 2023-09-05 MED ORDER — CIPROFLOXACIN HCL 500 MG PO TABS
ORAL_TABLET | ORAL | Status: AC
  Administered 2023-09-05: 500 mg via ORAL
  Filled 2023-09-05: qty 1

## 2023-09-05 MED ORDER — CIPROFLOXACIN HCL 500 MG PO TABS: 500.0000 mg | ORAL_TABLET | ORAL | Status: AC

## 2023-09-05 MED ORDER — DIPHENHYDRAMINE HCL 25 MG PO CAPS: 25.0000 mg | ORAL_CAPSULE | ORAL | Status: AC

## 2023-09-05 MED ORDER — TRAMADOL HCL 50 MG PO TABS: 50.0000 mg | ORAL_TABLET | Freq: Four times a day (QID) | ORAL | 0 refills | Status: AC | PRN

## 2023-09-05 MED ORDER — DIAZEPAM 5 MG PO TABS
ORAL_TABLET | ORAL | Status: AC
  Administered 2023-09-05: 10 mg via ORAL
  Filled 2023-09-05: qty 2

## 2023-09-05 MED ORDER — DIAZEPAM 5 MG PO TABS: 10.0000 mg | ORAL_TABLET | ORAL | Status: AC

## 2023-09-05 MED ORDER — SODIUM CHLORIDE 0.9 % IV SOLN: INTRAVENOUS | Status: DC

## 2023-09-05 NOTE — Op Note (Signed)
 See [employer] OP note scanned into chart. Also because of the size, density, location and other factors that cannot be anticipated I feel this will likely be a staged procedure. This fact supersedes any indication in the scanned [place] stone operative note to the contrary.

## 2023-09-05 NOTE — Interval H&P Note (Signed)
 History and Physical Interval Note:  09/05/2023 8:33 AM  Ryan Ferguson  has presented today for surgery, with the diagnosis of RIGHT URETERAL CALCULI.  The various methods of treatment have been discussed with the patient and family. After consideration of risks, benefits and other options for treatment, the patient has consented to  Procedure(s) with comments: LITHOTRIPSY, ESWL (Right) - RIGHT EXTRACORPOREAL SHOCKWAVE LITHOTRIPSY as a surgical intervention.  The patient's history has been reviewed, patient examined, no change in status, stable for surgery.  I have reviewed the patient's chart and labs.  Questions were answered to the patient's satisfaction.     Crist Fat

## 2023-09-05 NOTE — H&P (Signed)
 Kidney stones   Mr. Ryan Ferguson is a 73 year old male, previously followed by Dr. Retta Diones, with a history of kidney stones and BPH   07/04/2023: The patient is here today after being diagnosed with a 5 mm right UPJ stone on CT from December. He notes no residual pain, but has not seen any stone material passed yet. He denies nausea/vomiting, fever/chills, dysuria or hematuria. UA today is clear. He is voiding without difficulty and feels like he is emptying his bladder well. Nocturia x 2. Currently on tamsulosin once daily.   08/24/2023: The patient presents today due to a 48 hour history worsening right-sided flank pain associated with nausea and vomiting. He was noted to have a 5 mm right renal stone on KUB from 07/04/2023. Renal ultrasound from 07/20/2023 showed no evidence of hydronephrosis, but did show a lower pole stone shadowing. Denies N/V/F/C. From a urinary standpoint, the patient reports more urgency and frequency, which predate his current right flank pain symptoms. He is currently on tamsulosin once daily. He denies interval UTIs, dysuria or hematuria. UA today shows microscopic hematuria with no evidence of cystitis.   08/30/2023: 73 year old man who presents today for right ureteral stone follow-up. He has not passed stone. Continues to endorse pain and discomfort as well as slight nausea. His BMP was normal. He denies fevers and chills. He is voiding well and denies urgency and leakage.     ALLERGIES: Atorvastatin hydrochlorithiazide Lisinopril Ozempic Pravastatin Rosuvastatin    MEDICATIONS: Percocet 5 mg-325 mg tablet 1 tablet PO Q 4 H PRN  Tamsulosin Hcl 0.4 mg capsule 1 capsule PO Q 12 H  Tamsulosin Hcl 0.4 mg capsule  Ambien 10 mg tablet  Aspirin Regimen 81 mg tablet, delayed release  Atorvastatin Calcium 20 MG Oral Tablet Oral  Donepezil Hcl 5 mg tablet  Famotidine 20 mg tablet  Levothyroxine Sodium 100 MCG Oral Tablet Oral  Lisinopril-Hydrochlorothiazide 10-12.5 MG Oral  Tablet Oral  Livalo 1 mg tablet  Metformin Hcl 1,000 mg tablet Oral  Nitroglycerin  Olmesartan Medoxomil 20 mg tablet  Oxycodone Hcl 5 mg tablet  Promethazine Hcl 25 mg tablet 1 tablet PO Q 4 H PRN Take as needed for nausea  Vitamin D2  Zetia 10 mg tablet  Zofran     GU PSH: ESWL - 2013, 2013, 2009       PSH Notes: Coronary stent intervention- 12/2020, left heart cath and coronary angiography- 12/2020   NON-GU PSH: Appendectomy, at age 80     GU PMH: Flank Pain - 08/24/2023 Ureteral calculus (Stable) - 08/24/2023, Larina Bras is passed, he has remaining renal calculi that are quite small, - 04/13/2021, - 03/25/2021, Calculus of ureter, - 2014, Calculus of ureter, - 2014 BPH w/LUTS - 07/20/2023, - 07/04/2023, Symptoms not bad enough for him to want to take medication, - 07/14/2022, Normal prostate exam, normal PSA, - 05/22/2021 (Stable), He reports very mild urinary sx's. , - 2020, He has bothersome urinary symptoms, possibly secondary to BPH., - 2018 Renal calculus - 07/20/2023, (Stable), - 07/04/2023, Stable right upper pole stone, - 07/14/2022, - 05/22/2021, - 04/13/2021, - 2021 (Stable), - 2020, Left, KUB shows stone in lower pole of left kidney but none currently passing. UA clear of blood. , - 2020 (Stable), No evidence of active stone process, - 2018 (Improving), Right proximal ureteral stones, it looks like they have passed, - 2018, 2 symptomatic right proximal ureteral stones, both approximately 5 mm in size. Patient is fairly comfortable currently., - 2018 (Stable), Right, fairly stable  right renal calculi, asymptomatic, - 2017, Kidney stone on right side, - 2017, Bilateral kidney stones, - 2017 Nocturia - 07/04/2023, - 05/22/2021 Urinary Frequency - 07/14/2022, He does have worsening lower urinary tract symptoms, - 05/22/2021 Renal cyst, Detected on recent CT -- seemingly increased in size but he is not currently showing sx's. Will need to follow-up with RUS or MRI to further classify this cyst. - 2021,  (Stable), - 2017 LUQ pain - 2020 Low back pain, Lumbago - 2017 Hydronephrosis Unspec, Hydronephrosis - 2014      PMH Notes: Chronic kidney disease  2012-09-26 16:03:50 - Note: Thyroid Disorder   NON-GU PMH: Thoracic back pain, I discussed findings of CT with patient and that there are no ureteral calculi or hydronephrosis. I am unsure the source of his pain. I have recommend that he be evaluated by his PCP. I have given him 3 tabs of lortab due to it being Christmas week and trying to get an appointment. Patient may follow up with Dr. Retta Diones PRN - 2020 Encounter for general adult medical examination without abnormal findings, Encounter for preventive health examination - 2017 Personal history of other endocrine, nutritional and metabolic disease, History of hypercholesterolemia - 2014, History of diabetes mellitus, - 2014 Diabetes Type 2 Hypercholesterolemia Hypertension    FAMILY HISTORY: Death - Father Family Health Status Number - Runs In Family Heart Disease - Mother hyperlipidemia - Father Hypertension - Father Lung Cancer - Grandfather lupus - Sister No pertinent family history - Runs In Family pancreatic cancer - Father Thyroid Disease - Mother   SOCIAL HISTORY: Marital Status: Married Preferred Language: English; Ethnicity: Not Hispanic Or Latino; Race: Other Race Current Smoking Status: Patient does not smoke anymore. Has not smoked since 06/22/2011.   Tobacco Use Assessment Completed: Used Tobacco in last 30 days? Social Drinker.  Drinks 1 caffeinated drink per day.    REVIEW OF SYSTEMS:    GU Review Male:   slower, weaker stream.  Patient denies erection problems, leakage of urine, stream starts and stops, get up at night to urinate, burning/ pain with urination, hard to postpone urination, penile pain, frequent urination, trouble starting your stream, and have to strain to urinate .  Gastrointestinal (Upper):   Patient reports nausea. Patient denies vomiting and  indigestion/ heartburn.  Gastrointestinal (Lower):   Patient denies diarrhea and constipation.  Constitutional:   Patient denies fever, night sweats, weight loss, and fatigue.  Skin:   Patient denies skin rash/ lesion and itching.  Eyes:   Patient denies blurred vision and double vision.  Ears/ Nose/ Throat:   Patient denies sore throat and sinus problems.  Hematologic/Lymphatic:   Patient denies swollen glands and easy bruising.  Cardiovascular:   Patient denies leg swelling and chest pains.  Respiratory:   Patient denies cough and shortness of breath.  Endocrine:   Patient denies excessive thirst.  Musculoskeletal:   Patient reports back pain. Patient denies joint pain.  Neurological:   Patient denies headaches and dizziness.  Psychologic:   Patient denies depression and anxiety.   VITAL SIGNS:      08/30/2023 10:37 AM  BP 152/83 mmHg  Pulse 60 /min  Temperature 98.2 F / 36.7 C   GU PHYSICAL EXAMINATION:    Anus and Perineum: No hemorrhoids. No anal stenosis. No rectal fissure, no anal fissure. No edema, no dimple, no perineal tenderness, no anal tenderness.  Scrotum: No lesions. No edema. No cysts. No warts.  Epididymides: Right: no spermatocele, no masses, no cysts, no  tenderness, no induration, no enlargement. Left: no spermatocele, no masses, no cysts, no tenderness, no induration, no enlargement.  Testes: No tenderness, no swelling, no enlargement left testes. No tenderness, no swelling, no enlargement right testes. Normal location left testes. Normal location right testes. No mass, no cyst, no varicocele, no hydrocele left testes. No mass, no cyst, no varicocele, no hydrocele right testes.  Urethral Meatus: Normal size. No lesion, no wart, no discharge, no polyp. Normal location.  Penis: Circumcised, no warts, no cracks. No dorsal Peyronie's plaques, no left corporal Peyronie's plaques, no right corporal Peyronie's plaques, no scarring, no warts. No balanitis, no meatal stenosis.   Prostate: 40 gram or 2+ size. Left lobe normal consistency, right lobe normal consistency. Symmetrical lobes. No prostate nodule. Left lobe no tenderness, right lobe no tenderness.  Seminal Vesicles: Nonpalpable.  Sphincter Tone: Normal sphincter. No rectal tenderness. No rectal mass.    MULTI-SYSTEM PHYSICAL EXAMINATION:    Constitutional: Well-nourished. No physical deformities. Normally developed. Good grooming.  Neck: Neck symmetrical, not swollen. Normal tracheal position.  Respiratory: No labored breathing, no use of accessory muscles.   Cardiovascular: Normal temperature, normal extremity pulses, no swelling, no varicosities.  Lymphatic: No enlargement of neck, axillae, groin.  Skin: No paleness, no jaundice, no cyanosis. No lesion, no ulcer, no rash.  Neurologic / Psychiatric: Oriented to time, oriented to place, oriented to person. No depression, no anxiety, no agitation.  Gastrointestinal: No mass, no tenderness, no rigidity, non obese abdomen.  Eyes: Normal conjunctivae. Normal eyelids.  Ears, Nose, Mouth, and Throat: Left ear no scars, no lesions, no masses. Right ear no scars, no lesions, no masses. Nose no scars, no lesions, no masses. Normal hearing. Normal lips.  Musculoskeletal: Normal gait and station of head and neck.     Complexity of Data:  Source Of History:  Patient  Records Review:   Previous Doctor Records, Previous Patient Records  Urine Test Review:   Urinalysis   07/14/22 07/04/19 03/23/17 08/04/06 10/10/02  PSA  Total PSA 2.88 ng/mL 2.83 ng/mL 2.39 ng/mL 1.40  0.95     08/30/23  Urinalysis  Urine Appearance Clear   Urine Color Yellow   Urine Glucose Neg mg/dL  Urine Bilirubin Neg mg/dL  Urine Ketones Neg mg/dL  Urine Specific Gravity 1.015   Urine Blood 3+ ery/uL  Urine pH <=5.0   Urine Protein Neg mg/dL  Urine Urobilinogen 0.2 mg/dL  Urine Nitrites Neg   Urine Leukocyte Esterase Neg leu/uL  Urine WBC/hpf 6 - 10/hpf   Urine RBC/hpf 3 - 10/hpf    Urine Epithelial Cells NS (Not Seen)   Urine Bacteria Rare (0-9/hpf)   Urine Mucous Not Present   Urine Yeast NS (Not Seen)   Urine Trichomonas Not Present   Urine Cystals NS (Not Seen)   Urine Casts NS (Not Seen)   Urine Sperm Not Present    PROCEDURES:         KUB - 86578  A single view of the abdomen is obtained. At the level of the sacrum 5 mm opacity is noted within the right ureter.       . Patient confirmed No Neulasta OnPro Device.           Visit Complexity - G2211          Urinalysis w/Scope Dipstick Dipstick Cont'd Micro  Color: Yellow Bilirubin: Neg mg/dL WBC/hpf: 6 - 46/NGE  Appearance: Clear Ketones: Neg mg/dL RBC/hpf: 3 - 95/MWU  Specific Gravity: 1.015 Blood: 3+ ery/uL Bacteria:  Rare (0-9/hpf)  pH: <=5.0 Protein: Neg mg/dL Cystals: NS (Not Seen)  Glucose: Neg mg/dL Urobilinogen: 0.2 mg/dL Casts: NS (Not Seen)    Nitrites: Neg Trichomonas: Not Present    Leukocyte Esterase: Neg leu/uL Mucous: Not Present      Epithelial Cells: NS (Not Seen)      Yeast: NS (Not Seen)      Sperm: Not Present    ASSESSMENT:      ICD-10 Details  1 GU:   Ureteral calculus - N20.1 Right, Acute, Uncomplicated   PLAN:           Orders Labs CULTURE, URINE  X-Rays: KUB          Schedule Return Visit/Planned Activity: Next Available Appointment - Schedule Surgery          Document Letter(s):  Created for Patient: Clinical Summary         Notes:   KUB shows progression of right ureteral 5 mm stone from the level of L3 to the level of the sacrum. I discussed options including continuation of medical expulsive, ureteroscopy, ESWL. He would like to proceed with ESWL. Urinalysis will be sent for precautionary culture today. Stone intervention was discussed in detail today. For ureteroscopy, the patient understands that there is a chance for a staged procedure. Patient also understands that there is risk for bleeding, infection, injury to surrounding organs, and general risks of  anesthesia. The patient also understands the placement of a stent and the risks of stent placement including, risk for infection, the risk for pain, and the risk for injury. For ESWL, the patient understands that there is a chance of failure of procedure, there is also a risk for bruising, infection, bleeding, and injury to surrounding structures. The patient verbalized understanding to these risks.

## 2023-09-05 NOTE — Discharge Instructions (Addendum)
 See [facility] discharge instructions in chart.   Post Anesthesia Home Care Instructions  Activity: Get plenty of rest for the remainder of the day. A responsible individual must stay with you for 24 hours following the procedure.  For the next 24 hours, DO NOT: -Drive a car -Advertising copywriter -Drink alcoholic beverages -Take any medication unless instructed by your physician -Make any legal decisions or sign important papers.  Meals: Start with liquid foods such as gelatin or soup. Progress to regular foods as tolerated. Avoid greasy, spicy, heavy foods. If nausea and/or vomiting occur, drink only clear liquids until the nausea and/or vomiting subsides. Call your physician if vomiting continues.  Special Instructions/Symptoms: Your throat may feel dry or sore from the anesthesia or the breathing tube placed in your throat during surgery. If this causes discomfort, gargle with warm salt water. The discomfort should disappear within 24 hours.

## 2023-09-06 ENCOUNTER — Encounter (HOSPITAL_COMMUNITY): Payer: Self-pay | Admitting: Urology

## 2023-09-12 DIAGNOSIS — N201 Calculus of ureter: Secondary | ICD-10-CM | POA: Diagnosis not present

## 2023-09-13 ENCOUNTER — Other Ambulatory Visit: Payer: Self-pay | Admitting: Internal Medicine

## 2023-09-15 DIAGNOSIS — N201 Calculus of ureter: Secondary | ICD-10-CM | POA: Diagnosis not present

## 2023-09-21 ENCOUNTER — Other Ambulatory Visit: Payer: Self-pay | Admitting: Urology

## 2023-09-21 ENCOUNTER — Telehealth: Payer: Self-pay | Admitting: Cardiology

## 2023-09-21 DIAGNOSIS — R1084 Generalized abdominal pain: Secondary | ICD-10-CM | POA: Diagnosis not present

## 2023-09-21 DIAGNOSIS — N201 Calculus of ureter: Secondary | ICD-10-CM | POA: Diagnosis not present

## 2023-09-21 NOTE — Telephone Encounter (Signed)
   Pre-operative Risk Assessment    Patient Name: Ryan Ferguson  DOB: 08-Nov-1950 MRN: [id number]      Request for Surgical Clearance    Procedure:   Rt ureteroscopy, laser lithotripsy , stent placement  Date of Surgery:  Clearance 10/13/23                                 Surgeon:  Dr. Vallery Ridge Group or Practice Name:  [facility] Phone number:  [phone] Fax number:  [phone]   Type of Clearance Requested:   - Medical  - Pharmacy:  Hold Aspirin 5 days before   Type of Anesthesia:  General    Additional requests/questions:    SignedAndreas Blower   09/21/2023, 4:52 PM

## 2023-09-22 ENCOUNTER — Telehealth: Payer: Self-pay | Admitting: *Deleted

## 2023-09-22 NOTE — Telephone Encounter (Signed)
 I s/w the pt and his wife and they have agreed to tele appt tomorrow 09/23/23. Pt's wife helps translate for the pt as he understands English some but still needs helps.   Med rec and consent are done.

## 2023-09-22 NOTE — Telephone Encounter (Signed)
 I s/w the pt and his wife and they have agreed to tele appt tomorrow 09/23/23. Pt's wife helps translate for the pt as he understands English some but still needs helps.   Med rec and consent are done.      Patient Consent for Virtual Visit        QUINT CHESTNUT has provided verbal consent on 09/22/2023 for a virtual visit (video or telephone).   CONSENT FOR VIRTUAL VISIT FOR:  Ryan Ferguson  By participating in this virtual visit I agree to the following:  I hereby voluntarily request, consent and authorize [facility] and its employed or contracted physicians, physician assistants, nurse practitioners or other licensed health care professionals (the Practitioner), to provide me with telemedicine health care services (the "Services") as deemed necessary by the treating Practitioner. I acknowledge and consent to receive the Services by the Practitioner via telemedicine. I understand that the telemedicine visit will involve communicating with the Practitioner through live audiovisual communication technology and the disclosure of certain medical information by electronic transmission. I acknowledge that I have been given the opportunity to request an in-person assessment or other available alternative prior to the telemedicine visit and am voluntarily participating in the telemedicine visit.  I understand that I have the right to withhold or withdraw my consent to the use of telemedicine in the course of my care at any time, without affecting my right to future care or treatment, and that the Practitioner or I may terminate the telemedicine visit at any time. I understand that I have the right to inspect all information obtained and/or recorded in the course of the telemedicine visit and may receive copies of available information for a reasonable fee.  I understand that some of the potential risks of receiving the Services via telemedicine include:  Delay or interruption in medical  evaluation due to technological equipment failure or disruption; Information transmitted may not be sufficient (e.g. poor resolution of images) to allow for appropriate medical decision making by the Practitioner; and/or  In rare instances, security protocols could fail, causing a breach of personal health information.  Furthermore, I acknowledge that it is my responsibility to provide information about my medical history, conditions and care that is complete and accurate to the best of my ability. I acknowledge that Practitioner's advice, recommendations, and/or decision may be based on factors not within their control, such as incomplete or inaccurate data provided by me or distortions of diagnostic images or specimens that may result from electronic transmissions. I understand that the practice of medicine is not an exact science and that Practitioner makes no warranties or guarantees regarding treatment outcomes. I acknowledge that a copy of this consent can be made available to me via my patient portal [facility] MyChart), or I can request a printed copy by calling the office of [facility].    I understand that my insurance will be billed for this visit.   I have read or had this consent read to me. I understand the contents of this consent, which adequately explains the benefits and risks of the Services being provided via telemedicine.  I have been provided ample opportunity to ask questions regarding this consent and the Services and have had my questions answered to my satisfaction. I give my informed consent for the services to be provided through the use of telemedicine in my medical care

## 2023-09-22 NOTE — Telephone Encounter (Signed)
   Name: Ryan Ferguson  DOB: 1951/04/13  MRN: [id number]  Primary Cardiologist: Donato Schultz, MD   Preoperative team, please contact this patient and set up a phone call appointment for further preoperative risk assessment. Please obtain consent and complete medication review. Thank you for your help.  I confirm that guidance regarding antiplatelet and oral anticoagulation therapy has been completed and, if necessary, noted below. May hold ASA for a high bleeding risk procedure.   I also confirmed the patient resides in the state of [place]. As per [facility] Medical Board telemedicine laws, the patient must reside in the state in which the provider is licensed.   Marcelino Duster, PA 09/22/2023, 8:10 AM [facility]

## 2023-09-23 ENCOUNTER — Ambulatory Visit: Attending: Internal Medicine | Admitting: Emergency Medicine

## 2023-09-23 DIAGNOSIS — Z01810 Encounter for preprocedural cardiovascular examination: Secondary | ICD-10-CM | POA: Diagnosis not present

## 2023-09-23 NOTE — Progress Notes (Signed)
 Virtual Visit via Telephone Note   Because of SRIJAN GIVAN co-morbid illnesses, he is at least at moderate risk for complications without adequate follow up.  This format is felt to be most appropriate for this patient at this time.  Due to technical limitations with video connection (technology), today's appointment will be conducted as an audio only telehealth visit, and Ryan Ferguson verbally agreed to proceed in this manner.   All issues noted in this document were discussed and addressed.  No physical exam could be performed with this format.  Evaluation Performed:  Preoperative cardiovascular risk assessment _____________   Date:  09/23/2023   Patient ID:  Ryan Ferguson, DOB 1951/05/08, MRN [id number] Patient Location:  Home Provider location:   Office  Primary Care Provider:  Tresa Garter, MD Primary Cardiologist:  Donato Schultz, MD  Chief Complaint / Patient Profile   73 y.o. y/o male with a h/o coronary artery disease, hyperlipidemia, paroxysmal atrial tachycardia, hypertension, diabetes, CKD stage II, hypothyroidism who is pending right ureteroscopic, laser lithotripsy, stent placement on 10/13/2023 with [facility] and presents today for telephonic preoperative cardiovascular risk assessment.  History of Present Illness    Ryan Ferguson is a 73 y.o. male who presents via audio/video conferencing for a telehealth visit today.  Pt was last seen in cardiology clinic on 07/01/2023 by Dr. Anne Fu.  At that time Ryan Ferguson was doing well.  The patient is now pending procedure as outlined above. Since his last visit, he  denies chest pain, shortness of breath, lower extremity edema, fatigue, palpitations, melena, hematuria, hemoptysis, diaphoresis, weakness, presyncope, syncope, orthopnea, and PND.  Past Medical History    Past Medical History:  Diagnosis Date   Chronic kidney disease    Colon polyps    Diabetes mellitus without complication (HCC)     does not check cbg daily   Hypertension    Hyperthyroidism    Kidney stones    Past Surgical History:  Procedure Laterality Date   APPENDECTOMY     age 59   CORONARY STENT INTERVENTION N/A 01/09/2021   Procedure: CORONARY STENT INTERVENTION;  Surgeon: Lennette Bihari, MD;  Location: [facility];  Service: Cardiovascular;  Laterality: N/A;   CYSTOSCOPY     EXTRACORPOREAL SHOCK WAVE LITHOTRIPSY Right 09/05/2023   Procedure: LITHOTRIPSY, ESWL;  Surgeon: Crist Fat, MD;  Location: [facility];  Service: Urology;  Laterality: Right;  RIGHT EXTRACORPOREAL SHOCKWAVE LITHOTRIPSY   LEFT HEART CATH AND CORONARY ANGIOGRAPHY N/A 01/09/2021   Procedure: LEFT HEART CATH AND CORONARY ANGIOGRAPHY;  Surgeon: Lennette Bihari, MD;  Location: [facility];  Service: Cardiovascular;  Laterality: N/A;   LITHOTRIPSY     4-5 times    Allergies  Allergies  Allergen Reactions   Crestor [Rosuvastatin]     achy   Farxiga [Dapagliflozin]     Intolerance - itching, no rash   Hydrochlorothiazide Other (See Comments)    Unknown reaction   Lipitor [Atorvastatin]     myalgias   Lisinopril Cough   Ozempic (0.25 Or 0.5 Mg-Dose) [Semaglutide(0.25 Or 0.5mg -Dos)]     Weak, nausea   Pravachol [Pravastatin] Other (See Comments)    Muscle pain (currently tolerating 20 mg vs. 40 mg)    Home Medications    Prior to Admission medications   Medication Sig Start Date End Date Taking? Authorizing Provider  aspirin EC 81 MG tablet Take 1 tablet (81 mg total) by mouth daily. 05/24/19  Jake Bathe, MD  cholecalciferol (VITAMIN D) 1000 UNITS tablet Take 1,000 Units by mouth daily.    [provider]  donepezil (ARICEPT) 5 MG tablet Take 1 tablet (5 mg total) by mouth at bedtime. 04/28/23   Plotnikov, Georgina Quint, MD  ezetimibe (ZETIA) 10 MG tablet Take 1 tablet by mouth once daily 08/19/23   Plotnikov, Georgina Quint, MD  famotidine (PEPCID) 40 MG tablet Take 1 tablet by mouth once daily 06/16/23    Plotnikov, Georgina Quint, MD  levothyroxine (SYNTHROID) 112 MCG tablet TAKE 1 TABLET BY MOUTH IN THE MORNING 07/06/23   Plotnikov, Georgina Quint, MD  LIVALO 1 MG TABS Take 1 tablet by mouth once daily 01/10/23   Plotnikov, Georgina Quint, MD  metFORMIN (GLUCOPHAGE) 1000 MG tablet Take 1 tablet by mouth twice daily 09/13/23   Plotnikov, Georgina Quint, MD  nitroGLYCERIN (NITROSTAT) 0.4 MG SL tablet DISSOLVE ONE TABLET UNDER THE TONGUE EVERY 5 MINUTES AS NEEDED FOR CHEST PAIN.  DO NOT EXCEED A TOTAL OF 3 DOSES IN 15 MINUTES 09/28/21   Plotnikov, Georgina Quint, MD  olmesartan (BENICAR) 20 MG tablet Take 1 tablet (20 mg total) by mouth 2 (two) times daily. 07/14/23 07/13/24  Plotnikov, Georgina Quint, MD  ondansetron (ZOFRAN ODT) 4 MG disintegrating tablet Take 1 tablet (4 mg total) by mouth every 8 (eight) hours as needed for nausea or vomiting. 03/24/21   Jacalyn Lefevre, MD  ONE TOUCH ULTRA TEST test strip USE TEST STRIPS TO TEST BLOOD SUGAR ONCE DAILY 03/07/15   [provider]  tamsulosin (FLOMAX) 0.4 MG CAPS capsule Take 1 capsule (0.4 mg total) by mouth daily. 06/01/23   Sabas Sous, MD  traMADol (ULTRAM) 50 MG tablet Take 1-2 tablets (50-100 mg total) by mouth every 6 (six) hours as needed for moderate pain (pain score 4-6). 09/05/23   Crist Fat, MD  zolpidem (AMBIEN) 10 MG tablet Take 0.5-1 tablets (5-10 mg total) by mouth at bedtime as needed for sleep. 04/05/22   Plotnikov, Georgina Quint, MD    Physical Exam    Vital Signs:  Ryan Ferguson does not have vital signs available for review today.  Given telephonic nature of communication, physical exam is limited. AAOx3. NAD. Normal affect.  Speech and respirations are unlabored.  Accessory Clinical Findings    None  Assessment & Plan    1.  Preoperative Cardiovascular Risk Assessment: According to the Revised Cardiac Risk Index (RCRI), his Perioperative Risk of Major Cardiac Event is (%): 0.4. His Functional Capacity in METs is: 7.28 according to the  [facility] Activity Status Index (DASI). Therefore, based on ACC/AHA guidelines, patient would be at acceptable risk for the planned procedure without further cardiovascular testing.  The patient was advised that if he develops new symptoms prior to surgery to contact our office to arrange for a follow-up visit, and he verbalized understanding.  He may hold aspirin for 5 days prior to procedure. Please resume aspirin as soon as possible postprocedure, at the discretion of the surgeon.    A copy of this note will be routed to requesting surgeon.  Time:   Today, I have spent 7 minutes with the patient with telehealth technology discussing medical history, symptoms, and management plan.     Denyce Robert, NP  09/23/2023, 8:01 AM

## 2023-09-26 ENCOUNTER — Encounter: Payer: Self-pay | Admitting: Internal Medicine

## 2023-09-26 ENCOUNTER — Ambulatory Visit (INDEPENDENT_AMBULATORY_CARE_PROVIDER_SITE_OTHER): Payer: PPO | Admitting: Internal Medicine

## 2023-09-26 VITALS — BP 140/82 | HR 67 | Temp 98.0°F | Ht 65.0 in | Wt 146.0 lb

## 2023-09-26 DIAGNOSIS — G8929 Other chronic pain: Secondary | ICD-10-CM

## 2023-09-26 DIAGNOSIS — R202 Paresthesia of skin: Secondary | ICD-10-CM

## 2023-09-26 DIAGNOSIS — N182 Chronic kidney disease, stage 2 (mild): Secondary | ICD-10-CM | POA: Diagnosis not present

## 2023-09-26 DIAGNOSIS — I25118 Atherosclerotic heart disease of native coronary artery with other forms of angina pectoris: Secondary | ICD-10-CM

## 2023-09-26 DIAGNOSIS — Z7984 Long term (current) use of oral hypoglycemic drugs: Secondary | ICD-10-CM

## 2023-09-26 DIAGNOSIS — M5441 Lumbago with sciatica, right side: Secondary | ICD-10-CM

## 2023-09-26 DIAGNOSIS — I10 Essential (primary) hypertension: Secondary | ICD-10-CM

## 2023-09-26 DIAGNOSIS — E118 Type 2 diabetes mellitus with unspecified complications: Secondary | ICD-10-CM

## 2023-09-26 DIAGNOSIS — M5442 Lumbago with sciatica, left side: Secondary | ICD-10-CM

## 2023-09-26 LAB — CBC WITH DIFFERENTIAL/PLATELET
Basophils Absolute: 0.1 10*3/uL (ref 0.0–0.1)
Basophils Relative: 0.9 % (ref 0.0–3.0)
Eosinophils Absolute: 0.3 10*3/uL (ref 0.0–0.7)
Eosinophils Relative: 4.6 % (ref 0.0–5.0)
HCT: 40.1 % (ref 39.0–52.0)
Hemoglobin: 13.8 g/dL (ref 13.0–17.0)
Lymphocytes Relative: 40.2 % (ref 12.0–46.0)
Lymphs Abs: 2.3 10*3/uL (ref 0.7–4.0)
MCHC: 34.4 g/dL (ref 30.0–36.0)
MCV: 92.6 fl (ref 78.0–100.0)
Monocytes Absolute: 0.5 10*3/uL (ref 0.1–1.0)
Monocytes Relative: 7.9 % (ref 3.0–12.0)
Neutro Abs: 2.7 10*3/uL (ref 1.4–7.7)
Neutrophils Relative %: 46.4 % (ref 43.0–77.0)
Platelets: 212 10*3/uL (ref 150.0–400.0)
RBC: 4.33 Mil/uL (ref 4.22–5.81)
RDW: 13.4 % (ref 11.5–15.5)
WBC: 5.8 10*3/uL (ref 4.0–10.5)

## 2023-09-26 LAB — VITAMIN B12: Vitamin B-12: 377 pg/mL (ref 211–911)

## 2023-09-26 LAB — COMPREHENSIVE METABOLIC PANEL WITH GFR
ALT: 18 U/L (ref 0–53)
AST: 12 U/L (ref 0–37)
Albumin: 4.4 g/dL (ref 3.5–5.2)
Alkaline Phosphatase: 47 U/L (ref 39–117)
BUN: 19 mg/dL (ref 6–23)
CO2: 28 meq/L (ref 19–32)
Calcium: 10.1 mg/dL (ref 8.4–10.5)
Chloride: 102 meq/L (ref 96–112)
Creatinine, Ser: 0.98 mg/dL (ref 0.40–1.50)
GFR: 76.71 mL/min (ref >60.00)
Glucose, Bld: 169 mg/dL — ABNORMAL HIGH (ref 70–99)
Potassium: 4 meq/L (ref 3.5–5.1)
Sodium: 139 meq/L (ref 135–145)
Total Bilirubin: 0.4 mg/dL (ref 0.2–1.2)
Total Protein: 7 g/dL (ref 6.0–8.3)

## 2023-09-26 LAB — URINALYSIS
Bilirubin Urine: NEGATIVE
Hgb urine dipstick: NEGATIVE
Ketones, ur: NEGATIVE
Leukocytes,Ua: NEGATIVE
Nitrite: NEGATIVE
Specific Gravity, Urine: 1.025 (ref 1.000–1.030)
Total Protein, Urine: NEGATIVE
Urine Glucose: 100 — AB
Urobilinogen, UA: 0.2 (ref 0.0–1.0)
pH: 6 (ref 5.0–8.0)

## 2023-09-26 LAB — T4, FREE: Free T4: 0.86 ng/dL (ref 0.60–1.60)

## 2023-09-26 LAB — HEMOGLOBIN A1C: Hgb A1c MFr Bld: 7.2 % — ABNORMAL HIGH (ref 4.6–6.5)

## 2023-09-26 MED ORDER — DONEPEZIL HCL 10 MG PO TABS: 10.0000 mg | ORAL_TABLET | Freq: Every day | ORAL | 3 refills | Status: AC

## 2023-09-26 NOTE — Patient Instructions (Signed)

## 2023-09-26 NOTE — Assessment & Plan Note (Signed)
 On Olmesartan Hydrate well: He spends a lot of time [place] outside in the heat.

## 2023-09-26 NOTE — Assessment & Plan Note (Addendum)
Nl BP at home 

## 2023-09-26 NOTE — Assessment & Plan Note (Signed)
 On Metformin bid Start Farxiga 5 mg d Improved diet

## 2023-09-26 NOTE — Assessment & Plan Note (Signed)
 MSK - stable Referrals to chiro, ortho, PT were offered - pt declined

## 2023-09-26 NOTE — Progress Notes (Signed)
 Subjective:  Patient ID: Ryan Ferguson, male    DOB: [birth year]  Age: 73 y.o. MRN: [id number]  CC: Medical Management of Chronic Issues (3 Month Follow Up. Notes no concerns)   HPI LEARY MCNULTY presents for renal stone - s/p removal. Memory issues - stable  C/o pruritus x 2 -3 weeks  CBG 150  Outpatient Medications Prior to Visit  Medication Sig Dispense Refill   aspirin EC 81 MG tablet Take 1 tablet (81 mg total) by mouth daily. 90 tablet 3   cholecalciferol (VITAMIN D) 1000 UNITS tablet Take 1,000 Units by mouth daily.     ezetimibe (ZETIA) 10 MG tablet Take 1 tablet by mouth once daily 90 tablet 3   famotidine (PEPCID) 40 MG tablet Take 1 tablet by mouth once daily 90 tablet 0   levothyroxine (SYNTHROID) 112 MCG tablet TAKE 1 TABLET BY MOUTH IN THE MORNING 90 tablet 1   LIVALO 1 MG TABS Take 1 tablet by mouth once daily 30 tablet 5   metFORMIN (GLUCOPHAGE) 1000 MG tablet Take 1 tablet by mouth twice daily 180 tablet 3   nitroGLYCERIN (NITROSTAT) 0.4 MG SL tablet DISSOLVE ONE TABLET UNDER THE TONGUE EVERY 5 MINUTES AS NEEDED FOR CHEST PAIN.  DO NOT EXCEED A TOTAL OF 3 DOSES IN 15 MINUTES 50 tablet 0   olmesartan (BENICAR) 20 MG tablet Take 1 tablet (20 mg total) by mouth 2 (two) times daily. 180 tablet 3   ondansetron (ZOFRAN ODT) 4 MG disintegrating tablet Take 1 tablet (4 mg total) by mouth every 8 (eight) hours as needed for nausea or vomiting. 20 tablet 0   ONE TOUCH ULTRA TEST test strip USE TEST STRIPS TO TEST BLOOD SUGAR ONCE DAILY  4   tamsulosin (FLOMAX) 0.4 MG CAPS capsule Take 1 capsule (0.4 mg total) by mouth daily. 7 capsule 0   traMADol (ULTRAM) 50 MG tablet Take 1-2 tablets (50-100 mg total) by mouth every 6 (six) hours as needed for moderate pain (pain score 4-6). 15 tablet 0   zolpidem (AMBIEN) 10 MG tablet Take 0.5-1 tablets (5-10 mg total) by mouth at bedtime as needed for sleep. 30 tablet 3   donepezil (ARICEPT) 5 MG tablet Take 1 tablet (5 mg total) by  mouth at bedtime. 30 tablet 5   No facility-administered medications prior to visit.    ROS: Review of Systems  Constitutional:  Negative for appetite change, fatigue and unexpected weight change.  HENT:  Negative for congestion, nosebleeds, sneezing, sore throat and trouble swallowing.   Eyes:  Negative for itching and visual disturbance.  Respiratory:  Negative for cough and shortness of breath.   Cardiovascular:  Negative for chest pain, palpitations and leg swelling.  Gastrointestinal:  Negative for abdominal distention, blood in stool, diarrhea and nausea.  Genitourinary:  Negative for frequency and hematuria.  Musculoskeletal:  Positive for arthralgias and back pain. Negative for gait problem, joint swelling and neck pain.  Skin:  Negative for rash.  Neurological:  Negative for dizziness, tremors, speech difficulty and weakness.  Hematological:  Does not bruise/bleed easily.  Psychiatric/Behavioral:  Positive for decreased concentration and sleep disturbance. Negative for agitation, confusion, dysphoric mood and suicidal ideas. The patient is not nervous/anxious.     Objective:  BP (!) 140/82   Pulse 67   Temp 98 F (36.7 C)   Ht 5\' 5"  (1.651 m)   Wt 146 lb (66.2 kg)   SpO2 97%   BMI 24.30 kg/m   BP  Readings from Last 3 Encounters:  09/26/23 (!) 140/82  09/05/23 (!) 199/78  07/01/23 (!) 178/88    Wt Readings from Last 3 Encounters:  09/26/23 146 lb (66.2 kg)  09/05/23 140 lb (63.5 kg)  07/01/23 149 lb (67.6 kg)    Physical Exam Constitutional:      General: He is not in acute distress.    Appearance: He is well-developed.     Comments: NAD  Eyes:     Conjunctiva/sclera: Conjunctivae normal.     Pupils: Pupils are equal, round, and reactive to light.  Neck:     Thyroid: No thyromegaly.     Vascular: No JVD.  Cardiovascular:     Rate and Rhythm: Normal rate and regular rhythm.     Heart sounds: Normal heart sounds. No murmur heard.    No friction rub. No  gallop.  Pulmonary:     Effort: Pulmonary effort is normal. No respiratory distress.     Breath sounds: Normal breath sounds. No wheezing or rales.  Chest:     Chest wall: No tenderness.  Abdominal:     General: Bowel sounds are normal. There is no distension.     Palpations: Abdomen is soft. There is no mass.     Tenderness: There is no abdominal tenderness. There is no guarding or rebound.  Musculoskeletal:        General: No tenderness. Normal range of motion.     Cervical back: Normal range of motion.  Lymphadenopathy:     Cervical: No cervical adenopathy.  Skin:    General: Skin is warm and dry.     Findings: No rash.  Neurological:     Mental Status: He is alert and oriented to person, place, and time.     Cranial Nerves: No cranial nerve deficit.     Motor: No abnormal muscle tone.     Coordination: Coordination normal.     Gait: Gait normal.     Deep Tendon Reflexes: Reflexes are normal and symmetric.  Psychiatric:        Behavior: Behavior normal.        Thought Content: Thought content normal.        Judgment: Judgment normal.   No rash, looks well LS NT   Lab Results  Component Value Date   WBC 10.3 06/01/2023   HGB 13.6 06/01/2023   HCT 40.3 06/01/2023   PLT 210 06/01/2023   GLUCOSE 170 (H) 06/28/2023   CHOL 157 09/06/2022   TRIG 93.0 09/06/2022   HDL 48.90 09/06/2022   LDLCALC 89 09/06/2022   ALT 15 06/28/2023   AST 14 06/28/2023   NA 138 06/28/2023   K 3.9 06/28/2023   CL 100 06/28/2023   CREATININE 1.14 06/28/2023   BUN 27 (H) 06/28/2023   CO2 28 06/28/2023   TSH 5.42 06/28/2023   PSA 2.17 03/26/2020   HGBA1C 7.5 (H) 06/28/2023   MICROALBUR 2.4 (H) 09/06/2022    DG Abd 1 View Result Date: 09/05/2023 CLINICAL DATA:  Right ureteral calculus, preop. EXAM: ABDOMEN - 1 VIEW COMPARISON:  Most recent radiograph 08/30/2023.  CT 05/31/2023 FINDINGS: 7 mm calcification projecting over the right sacrum is likely distal migration of ureteral calculus.  Additional stable pelvic calcifications correspond to phleboliths and arterial vascular calcifications. Nonobstructive bowel gas pattern. Small volume of colonic stool. IMPRESSION: A 7 mm calcification projecting over the right sacrum is likely distal migration of ureteral calculus. Electronically Signed   By: Ivette Loyal.D.  On: 09/05/2023 10:10    Assessment & Plan:   Problem List Items Addressed This Visit     DM (diabetes mellitus), type 2 with complications (HCC) - Primary   On Metformin bid Start Farxiga 5 mg d Improved diet      Relevant Orders   CBC with Differential/Platelet   Hemoglobin A1c   T4, free   Urinalysis   Hypertension   Nl BP at home      CRI (chronic renal insufficiency), stage 2 (mild)   On Olmesartan Hydrate well: He [place] of [place].      CAD (coronary artery disease), native coronary artery   Relevant Orders   Comprehensive metabolic panel with GFR   Low back pain   MSK - stable Referrals to chiro, ortho, PT were offered - pt declined      Other Visit Diagnoses       Paresthesia       Relevant Orders   Vitamin B12   T4, free   Urinalysis         Meds ordered this encounter  Medications   donepezil (ARICEPT) 10 MG tablet    Sig: Take 1 tablet (10 mg total) by mouth at bedtime.    Dispense:  90 tablet    Refill:  3      Follow-up: Return in about 3 months (around 12/26/2023) for a follow-up visit.  Sonda Primes, MD

## 2023-09-28 ENCOUNTER — Encounter (HOSPITAL_COMMUNITY): Admission: RE | Admit: 2023-09-28 | Source: Ambulatory Visit

## 2023-09-28 DIAGNOSIS — N200 Calculus of kidney: Secondary | ICD-10-CM | POA: Diagnosis not present

## 2023-10-13 ENCOUNTER — Ambulatory Visit (HOSPITAL_COMMUNITY): Admit: 2023-10-13 | Admitting: Urology

## 2023-10-13 SURGERY — CYSTOSCOPY/URETEROSCOPY/HOLMIUM LASER/STENT PLACEMENT
Anesthesia: General | Laterality: Right

## 2023-11-21 ENCOUNTER — Ambulatory Visit (INDEPENDENT_AMBULATORY_CARE_PROVIDER_SITE_OTHER): Payer: PPO

## 2023-11-21 ENCOUNTER — Other Ambulatory Visit

## 2023-11-21 VITALS — BP 132/62 | HR 64 | Ht 64.0 in | Wt 146.0 lb

## 2023-11-21 DIAGNOSIS — E118 Type 2 diabetes mellitus with unspecified complications: Secondary | ICD-10-CM

## 2023-11-21 DIAGNOSIS — Z1159 Encounter for screening for other viral diseases: Secondary | ICD-10-CM

## 2023-11-21 DIAGNOSIS — Z1211 Encounter for screening for malignant neoplasm of colon: Secondary | ICD-10-CM | POA: Diagnosis not present

## 2023-11-21 DIAGNOSIS — Z0000 Encounter for general adult medical examination without abnormal findings: Secondary | ICD-10-CM

## 2023-11-21 LAB — MICROALBUMIN / CREATININE URINE RATIO
Creatinine,U: 81.8 mg/dL
Microalb Creat Ratio: 50.4 mg/g — ABNORMAL HIGH (ref 0.0–30.0)
Microalb, Ur: 4.1 mg/dL — ABNORMAL HIGH (ref 0.0–1.9)

## 2023-11-21 NOTE — Patient Instructions (Signed)
 Ryan Ferguson , Thank you for taking time out of your busy schedule to complete your Annual Wellness Visit with me. I enjoyed our conversation and look forward to speaking with you again next year. I, as well as your care team,  appreciate your ongoing commitment to your health goals. Please review the following plan we discussed and let me know if I can assist you in the future. Your Game plan/ To Do List    Referrals: If you haven't heard from the office you've been referred to, please reach out to them at the phone provided.  Referral to Dr Brice Campi for a Screening Colonoscopy Follow up Visits: Next Medicare AWV with our clinical staff: 11/21/2024 at 8:10am   Have you seen your provider in the last 6 months (3 months if uncontrolled diabetes)? Yes Next Office Visit with your provider: 12/26/2023 at 7:50am  Clinician Recommendations:  Aim for 30 minutes of exercise or brisk walking, 6-8 glasses of water, and 5 servings of fruits and vegetables each day. Educated and advised to get the Pneumonia and Tdap (Tetenus) vaccines at [facility].        This is a list of the screening recommended for you and due dates:  Health Maintenance  Topic Date Due   Hepatitis C Screening  Never done   Colon Cancer Screening  Never done   COVID-19 Vaccine (4 - 2024-25 season) 02/20/2023   DTaP/Tdap/Td vaccine (3 - Td or Tdap) 07/10/2023   Eye exam for diabetics  08/18/2023   Yearly kidney health urinalysis for diabetes  09/06/2023   Complete foot exam   03/21/2024   Hemoglobin A1C  03/27/2024   Yearly kidney function blood test for diabetes  09/25/2024   Medicare Annual Wellness Visit  11/20/2024   Pneumonia Vaccine  Completed   HPV Vaccine  Aged Out   Meningitis B Vaccine  Aged Out   Flu Shot  Discontinued   Zoster (Shingles) Vaccine  Discontinued    Advanced directives: (Declined) Advance directive discussed with you today. Even though you declined this today, please call our office should you  change your mind, and we can give you the proper paperwork for you to fill out. Advance Care Planning is important because it:  [x]  Makes sure you receive the medical care that is consistent with your values, goals, and preferences  [x]  It provides guidance to your family and loved ones and reduces their decisional burden about whether or not they are making the right decisions based on your wishes.  Follow the link provided in your after visit summary or read over the paperwork we have mailed to you to help you started getting your Advance Directives in place. If you need assistance in completing these, please reach out to us  so that we can help you!

## 2023-11-21 NOTE — Progress Notes (Addendum)
 Subjective:   Ryan Ferguson is a 73 y.o. who presents for a Medicare Wellness preventive visit.  As a reminder, Annual Wellness Visits don't include a physical exam, and some assessments may be limited, especially if this visit is performed virtually. We may recommend an in-person follow-up visit with your provider if needed.  Visit Complete: In person  Persons Participating in Visit: Patient.  AWV Questionnaire: No: Patient Medicare AWV questionnaire was not completed prior to this visit.  Cardiac Risk Factors include: advanced age (>69men, >14 women);diabetes mellitus;dyslipidemia;hypertension;male gender     Objective:     Today's Vitals   11/21/23 0809  BP: 132/62  Pulse: 64  SpO2: 98%  Weight: 146 lb (66.2 kg)  Height: 5\' 4"  (1.626 m)   Body mass index is 25.06 kg/m.     11/21/2023    8:08 AM 09/05/2023    6:52 AM 11/17/2022    8:46 AM 01/11/2022   11:06 AM 01/08/2022    9:06 AM 01/09/2021    9:02 AM 05/07/2020    3:10 PM  Advanced Directives  Does Patient Have a Medical Advance Directive? No No No Yes No No No  Type of Advance Directive    Living will;Healthcare Power of Attorney     Does patient want to make changes to medical advance directive?    No - Patient declined     Would patient like information on creating a medical advance directive? No - Patient declined  No - Patient declined   No - Patient declined No - Patient declined    Current Medications (verified) Outpatient Encounter Medications as of 11/21/2023  Medication Sig   aspirin  EC 81 MG tablet Take 1 tablet (81 mg total) by mouth daily.   cholecalciferol (VITAMIN D) 1000 UNITS tablet Take 1,000 Units by mouth daily.   donepezil  (ARICEPT ) 10 MG tablet Take 1 tablet (10 mg total) by mouth at bedtime.   ezetimibe  (ZETIA ) 10 MG tablet Take 1 tablet by mouth once daily   famotidine  (PEPCID ) 40 MG tablet Take 1 tablet by mouth once daily   levothyroxine  (SYNTHROID ) 112 MCG tablet TAKE 1 TABLET BY MOUTH  IN THE MORNING   LIVALO  1 MG TABS Take 1 tablet by mouth once daily   metFORMIN  (GLUCOPHAGE ) 1000 MG tablet Take 1 tablet by mouth twice daily   nitroGLYCERIN  (NITROSTAT ) 0.4 MG SL tablet DISSOLVE ONE TABLET UNDER THE TONGUE EVERY 5 MINUTES AS NEEDED FOR CHEST PAIN.  DO NOT EXCEED A TOTAL OF 3 DOSES IN 15 MINUTES   olmesartan  (BENICAR ) 20 MG tablet Take 1 tablet (20 mg total) by mouth 2 (two) times daily.   ondansetron  (ZOFRAN  ODT) 4 MG disintegrating tablet Take 1 tablet (4 mg total) by mouth every 8 (eight) hours as needed for nausea or vomiting.   ONE TOUCH ULTRA TEST test strip USE TEST STRIPS TO TEST BLOOD SUGAR ONCE DAILY   tamsulosin  (FLOMAX ) 0.4 MG CAPS capsule Take 1 capsule (0.4 mg total) by mouth daily.   traMADol  (ULTRAM ) 50 MG tablet Take 1-2 tablets (50-100 mg total) by mouth every 6 (six) hours as needed for moderate pain (pain score 4-6). (Patient not taking: Reported on 11/21/2023)   zolpidem  (AMBIEN ) 10 MG tablet Take 0.5-1 tablets (5-10 mg total) by mouth at bedtime as needed for sleep. (Patient not taking: Reported on 11/21/2023)   No facility-administered encounter medications on file as of 11/21/2023.    Allergies (verified) Crestor  [rosuvastatin ], Farxiga  [dapagliflozin ], Hydrochlorothiazide , Lipitor [atorvastatin], Lisinopril , Ozempic  (  0.25 or 0.5 mg-dose) [semaglutide (0.25 or 0.5mg -dos)], and Pravachol  [pravastatin ]   History: Past Medical History:  Diagnosis Date   Chronic kidney disease    Colon polyps    Diabetes mellitus without complication (HCC)    does not check cbg daily   Hypertension    Hyperthyroidism    Kidney stones    Past Surgical History:  Procedure Laterality Date   APPENDECTOMY     age 46   CORONARY STENT INTERVENTION N/A 01/09/2021   Procedure: CORONARY STENT INTERVENTION;  Surgeon: Millicent Ally, MD;  Location: [facility];  Service: Cardiovascular;  Laterality: N/A;   CYSTOSCOPY     EXTRACORPOREAL SHOCK WAVE LITHOTRIPSY Right 09/05/2023    Procedure: LITHOTRIPSY, ESWL;  Surgeon: Andrez Banker, MD;  Location: [facility];  Service: Urology;  Laterality: Right;  RIGHT EXTRACORPOREAL SHOCKWAVE LITHOTRIPSY   LEFT HEART CATH AND CORONARY ANGIOGRAPHY N/A 01/09/2021   Procedure: LEFT HEART CATH AND CORONARY ANGIOGRAPHY;  Surgeon: Millicent Ally, MD;  Location: [facility];  Service: Cardiovascular;  Laterality: N/A;   LITHOTRIPSY     4-5 times   Family History  Problem Relation Age of Onset   Heart disease Mother    Thyroid  disease Mother    High Cholesterol Father    Hypertension Father    Varicose Veins Father    Pancreatic cancer Father    Kidney disease Father    Lupus Sister    Diabetes Maternal Grandmother    Heart disease Maternal Grandfather    Lung cancer Maternal Grandfather    Heart disease Paternal Grandmother    Colon cancer Neg Hx    Esophageal cancer Neg Hx    Stomach cancer Neg Hx    Liver disease Neg Hx    Social History   Socioeconomic History   Marital status: Married    Spouse name: Not on file   Number of children: 3   Years of education: Not on file   Highest education level: Not on file  Occupational History   Not on file  Tobacco Use   Smoking status: Former    Current packs/day: 0.00    Types: Cigarettes    Quit date: 10/25/2010    Years since quitting: 13.0   Smokeless tobacco: Never  Vaping Use   Vaping status: Never Used  Substance and Sexual Activity   Alcohol use: Yes    Alcohol/week: 1.0 standard drink of alcohol    Types: 1 Shots of liquor per week    Comment: occasional   Drug use: No   Sexual activity: Yes  Other Topics Concern   Not on file  Social History Narrative   Married   Social Drivers of Health   Financial Resource Strain: Low Risk  (11/21/2023)   Overall Financial Resource Strain (CARDIA)    Difficulty of Paying Living Expenses: Not hard at all  Food Insecurity: No Food Insecurity (11/21/2023)   Hunger Vital Sign    Worried About Running Out of  Food in the Last Year: Never true    Ran Out of Food in the Last Year: Never true  Transportation Needs: No Transportation Needs (11/21/2023)   PRAPARE - Administrator, Civil Service (Medical): No    Lack of Transportation (Non-Medical): No  Physical Activity: Sufficiently Active (11/21/2023)   Exercise Vital Sign    Days of Exercise per Week: 7 days    Minutes of Exercise per Session: 60 min  Stress: No Stress Concern  Present (11/21/2023)   [employer] of Occupational Health - Occupational Stress Questionnaire    Feeling of Stress : Not at all  Social Connections: Moderately Isolated (11/21/2023)   Social Connection and Isolation Panel [NHANES]    Frequency of Communication with Friends and Family: More than three times a week    Frequency of Social Gatherings with Friends and Family: More than three times a week    Attends Religious Services: Never    Database administrator or Organizations: No    Attends Engineer, structural: Never    Marital Status: Married    Tobacco Counseling Counseling given: No    Clinical Intake:  Pre-visit preparation completed: Yes  Pain : No/denies pain     BMI - recorded: 25.06 Nutritional Status: BMI 25 -29 Overweight Nutritional Risks: None Diabetes: Yes CBG done?: No Did pt. bring in CBG monitor from home?: No  Lab Results  Component Value Date   HGBA1C 7.2 (H) 09/26/2023   HGBA1C 7.5 (H) 06/28/2023   HGBA1C 7.4 (H) 02/10/2023     How often do you need to have someone help you when you read instructions, pamphlets, or other written materials from your doctor or pharmacy?: 1 - Never  Interpreter Needed?: No  Information entered by :: Kandy Orris, CMA   Activities of Daily Living     11/21/2023    8:11 AM 09/05/2023    6:59 AM  In your present state of health, do you have any difficulty performing the following activities:  Hearing? 0 0  Vision? 0 0  Difficulty concentrating or making decisions? 0 0   Walking or climbing stairs? 0   Dressing or bathing? 0   Doing errands, shopping? 0   Preparing Food and eating ? N   Using the Toilet? N   In the past six months, have you accidently leaked urine? N   Do you have problems with loss of bowel control? N   Managing your Medications? Y   Comment Spouse helps   Managing your Finances? N   Housekeeping or managing your Housekeeping? N     Patient Care Team: Plotnikov, Oakley Bellman, MD as PCP - General (Internal Medicine) Hugh Madura, MD as PCP - Cardiology (Cardiology) Trent Frizzle, MD as Consulting Physician (Urology) Baldo Bonds, MD as Consulting Physician (Gastroenterology) Gordy Lauber, MD as Consulting Physician (Endocrinology) Loyde Rule, MD as Consulting Physician (Cardiology) Pa, [facility] as Consulting Physician (Ophthalmology) Dema Filler, MD as Consulting Physician (Ophthalmology)  I have updated your Care Teams any recent Medical Services you may have received from other providers in the past year.     Assessment:    This is a routine wellness examination for Ryan Ferguson.  Hearing/Vision screen Hearing Screening - Comments:: Denies hearing difficulties   Vision Screening - Comments:: Wears rx glasses - appt on 11/22/23 w/Dr McCuen   Goals Addressed               This Visit's Progress     Patient Stated (pt-stated)        Patient stated he plans to exercise more.       Depression Screen     11/21/2023    8:16 AM 06/28/2023    7:55 AM 04/28/2023    8:25 AM 02/10/2023   10:56 AM 12/07/2022    8:24 AM 11/17/2022    8:48 AM 09/06/2022    8:44 AM  PHQ 2/9 Scores  PHQ - 2 Score 0 0  0 0 0 0 0  PHQ- 9 Score 2    0 0     Fall Risk     11/21/2023    8:13 AM 06/28/2023    7:54 AM 04/28/2023    8:25 AM 02/10/2023   10:56 AM 12/07/2022    8:24 AM  Fall Risk   Falls in the past year? 0 0 0 0 0  Number falls in past yr: 0 0 0 0 0  Injury with Fall? 0 0 0 0 0  Risk for fall due  to : No Fall Risks No Fall Risks No Fall Risks No Fall Risks No Fall Risks  Follow up Falls evaluation completed Falls evaluation completed Falls evaluation completed Falls evaluation completed Falls evaluation completed    MEDICARE RISK AT HOME:  Medicare Risk at Home Any stairs in or around the home?: Yes If so, are there any without handrails?: Yes Home free of loose throw rugs in walkways, pet beds, electrical cords, etc?: Yes Adequate lighting in your home to reduce risk of falls?: Yes Life alert?: No Use of a cane, walker or w/c?: No Grab bars in the bathroom?: Yes Shower chair or bench in shower?: Yes Elevated toilet seat or a handicapped toilet?: Yes  TIMED UP AND GO:  Was the test performed?  No  Cognitive Function: 6CIT completed        11/21/2023    8:14 AM 11/17/2022    8:29 AM 01/11/2022   11:18 AM  6CIT Screen  What Year? 0 points 0 points 0 points  What month? 0 points 0 points 0 points  What time? 0 points 0 points 0 points  Count back from 20 0 points 0 points 0 points  Months in reverse 4 points 0 points 0 points  Repeat phrase 4 points 0 points 0 points  Total Score 8 points 0 points 0 points    Immunizations Immunization History  Administered Date(s) Administered   PFIZER(Purple Top)SARS-COV-2 Vaccination 08/20/2019, 09/18/2019, 04/19/2020   PNEUMOCOCCAL CONJUGATE-20 12/07/2022   Tdap 11/05/2008, 07/09/2013   Zoster, Live 07/10/2013    Screening Tests Health Maintenance  Topic Date Due   Hepatitis C Screening  Never done   Colonoscopy  Never done   COVID-19 Vaccine (4 - 2024-25 season) 02/20/2023   DTaP/Tdap/Td (3 - Td or Tdap) 07/10/2023   OPHTHALMOLOGY EXAM  08/18/2023   Diabetic kidney evaluation - Urine ACR  09/06/2023   FOOT EXAM  03/21/2024   HEMOGLOBIN A1C  03/27/2024   Diabetic kidney evaluation - eGFR measurement  09/25/2024   Medicare Annual Wellness (AWV)  11/20/2024   Pneumonia Vaccine 76+ Years old  Completed   HPV VACCINES   Aged Out   Meningococcal B Vaccine  Aged Out   INFLUENZA VACCINE  Discontinued   Zoster Vaccines- Shingrix  Discontinued    Health Maintenance  Health Maintenance Due  Topic Date Due   Hepatitis C Screening  Never done   Colonoscopy  Never done   COVID-19 Vaccine (4 - 2024-25 season) 02/20/2023   DTaP/Tdap/Td (3 - Td or Tdap) 07/10/2023   OPHTHALMOLOGY EXAM  08/18/2023   Diabetic kidney evaluation - Urine ACR  09/06/2023   Health Maintenance Items Addressed:  Referral sent to GI for colonoscopy, Labs Ordered: UCR and Hepatitis C Screening today  Additional Screening:  Vision Screening: Recommended annual ophthalmology exams for early detection of glaucoma and other disorders of the eye. Pt stated has an appt w/dr Christine McCuen on 11/22/2023 for diabetic  eye exam.    Dental Screening: Recommended annual dental exams for proper oral hygiene  Community Resource Referral / Chronic Care Management: CRR required this visit?  No   CCM required this visit?  No   Plan:    I have personally reviewed and noted the following in the patient's chart:   Medical and social history Use of alcohol, tobacco or illicit drugs  Current medications and supplements including opioid prescriptions. Patient is not currently taking opioid prescriptions. Functional ability and status Nutritional status Physical activity Advanced directives List of other physicians Hospitalizations, surgeries, and ER visits in previous 12 months Vitals Screenings to include cognitive, depression, and falls Referrals and appointments  In addition, I have reviewed and discussed with patient certain preventive protocols, quality metrics, and best practice recommendations. A written personalized care plan for preventive services as well as general preventive health recommendations were provided to patient.   Patria Bookbinder, CMA   11/21/2023   After Visit Summary: (In Person-Printed) AVS printed and given to  the patient  Notes: Nothing significant to report at this time.  Medical screening examination/treatment/procedure(s) were performed by non-physician practitioner and as supervising physician I was immediately available for consultation/collaboration.  I agree with above. Adelaide Holy, MD

## 2023-11-22 ENCOUNTER — Encounter: Payer: Self-pay | Admitting: Internal Medicine

## 2023-11-22 DIAGNOSIS — H52203 Unspecified astigmatism, bilateral: Secondary | ICD-10-CM | POA: Diagnosis not present

## 2023-11-22 DIAGNOSIS — H2513 Age-related nuclear cataract, bilateral: Secondary | ICD-10-CM | POA: Diagnosis not present

## 2023-11-22 DIAGNOSIS — E119 Type 2 diabetes mellitus without complications: Secondary | ICD-10-CM | POA: Diagnosis not present

## 2023-11-22 LAB — HM DIABETES EYE EXAM

## 2023-11-22 LAB — HEPATITIS C ANTIBODY: Hepatitis C Ab: NONREACTIVE

## 2023-11-23 ENCOUNTER — Ambulatory Visit: Payer: Self-pay | Admitting: Internal Medicine

## 2023-11-24 ENCOUNTER — Ambulatory Visit: Admitting: Internal Medicine

## 2023-11-24 ENCOUNTER — Encounter: Payer: Self-pay | Admitting: Internal Medicine

## 2023-11-24 VITALS — BP 118/78 | HR 62 | Temp 98.6°F | Ht 64.0 in | Wt 147.0 lb

## 2023-11-24 DIAGNOSIS — R29898 Other symptoms and signs involving the musculoskeletal system: Secondary | ICD-10-CM

## 2023-11-24 DIAGNOSIS — N182 Chronic kidney disease, stage 2 (mild): Secondary | ICD-10-CM | POA: Diagnosis not present

## 2023-11-24 DIAGNOSIS — M79605 Pain in left leg: Secondary | ICD-10-CM

## 2023-11-24 DIAGNOSIS — E118 Type 2 diabetes mellitus with unspecified complications: Secondary | ICD-10-CM

## 2023-11-24 DIAGNOSIS — M150 Primary generalized (osteo)arthritis: Secondary | ICD-10-CM | POA: Diagnosis not present

## 2023-11-24 DIAGNOSIS — M5441 Lumbago with sciatica, right side: Secondary | ICD-10-CM | POA: Diagnosis not present

## 2023-11-24 DIAGNOSIS — M5442 Lumbago with sciatica, left side: Secondary | ICD-10-CM | POA: Diagnosis not present

## 2023-11-24 DIAGNOSIS — R809 Proteinuria, unspecified: Secondary | ICD-10-CM

## 2023-11-24 DIAGNOSIS — M79604 Pain in right leg: Secondary | ICD-10-CM

## 2023-11-24 DIAGNOSIS — Z7984 Long term (current) use of oral hypoglycemic drugs: Secondary | ICD-10-CM | POA: Diagnosis not present

## 2023-11-24 DIAGNOSIS — G8929 Other chronic pain: Secondary | ICD-10-CM

## 2023-11-24 NOTE — Assessment & Plan Note (Signed)
 Hard physical work

## 2023-11-24 NOTE — Assessment & Plan Note (Signed)
 On Olmesartan Hydrate well: He spends a lot of time [place] outside in the heat.

## 2023-11-24 NOTE — Progress Notes (Signed)
 Subjective:  Patient ID: Ryan Ferguson, male    DOB: 1950-09-12  Age: 73 y.o. MRN: [id number]  CC: Back Pain (Pt states he is having b/l sciatica pain off and that radiates down his legs at times. The pain stay long at times and at times is goes away after a few seconds. Pt states he notices this pain more when he is bending... Pt states if he is picking something up form the ground he notices his lower extremities become weak as he tries to hold the object on front of him.)   HPI Ryan Ferguson presents for alternating B leg moderate pain, muscle pain, stiffness. He was in the cabin for 1 month  - hard physical work. No weakness. No LBP.  Musculoskeletal: Mild degenerative changes of the visualized thoracolumbar spine.  Outpatient Medications Prior to Visit  Medication Sig Dispense Refill   aspirin  EC 81 MG tablet Take 1 tablet (81 mg total) by mouth daily. 90 tablet 3   cholecalciferol (VITAMIN D) 1000 UNITS tablet Take 1,000 Units by mouth daily.     donepezil  (ARICEPT ) 10 MG tablet Take 1 tablet (10 mg total) by mouth at bedtime. 90 tablet 3   ezetimibe  (ZETIA ) 10 MG tablet Take 1 tablet by mouth once daily 90 tablet 3   famotidine  (PEPCID ) 40 MG tablet Take 1 tablet by mouth once daily 90 tablet 0   levothyroxine  (SYNTHROID ) 112 MCG tablet TAKE 1 TABLET BY MOUTH IN THE MORNING 90 tablet 1   LIVALO  1 MG TABS Take 1 tablet by mouth once daily 30 tablet 5   metFORMIN  (GLUCOPHAGE ) 1000 MG tablet Take 1 tablet by mouth twice daily 180 tablet 3   nitroGLYCERIN  (NITROSTAT ) 0.4 MG SL tablet DISSOLVE ONE TABLET UNDER THE TONGUE EVERY 5 MINUTES AS NEEDED FOR CHEST PAIN.  DO NOT EXCEED A TOTAL OF 3 DOSES IN 15 MINUTES 50 tablet 0   olmesartan  (BENICAR ) 20 MG tablet Take 1 tablet (20 mg total) by mouth 2 (two) times daily. 180 tablet 3   ondansetron  (ZOFRAN  ODT) 4 MG disintegrating tablet Take 1 tablet (4 mg total) by mouth every 8 (eight) hours as needed for nausea or vomiting. 20 tablet 0    ONE TOUCH ULTRA TEST test strip USE TEST STRIPS TO TEST BLOOD SUGAR ONCE DAILY  4   tamsulosin  (FLOMAX ) 0.4 MG CAPS capsule Take 1 capsule (0.4 mg total) by mouth daily. 7 capsule 0   traMADol  (ULTRAM ) 50 MG tablet Take 1-2 tablets (50-100 mg total) by mouth every 6 (six) hours as needed for moderate pain (pain score 4-6). (Patient not taking: Reported on 11/24/2023) 15 tablet 0   zolpidem  (AMBIEN ) 10 MG tablet Take 0.5-1 tablets (5-10 mg total) by mouth at bedtime as needed for sleep. (Patient not taking: Reported on 11/24/2023) 30 tablet 3   No facility-administered medications prior to visit.    ROS: Review of Systems  Constitutional:  Negative for appetite change, fatigue and unexpected weight change.  HENT:  Negative for congestion, nosebleeds, sneezing, sore throat and trouble swallowing.   Eyes:  Negative for itching and visual disturbance.  Respiratory:  Negative for cough.   Cardiovascular:  Negative for chest pain, palpitations and leg swelling.  Gastrointestinal:  Negative for abdominal distention, blood in stool, diarrhea and nausea.  Genitourinary:  Negative for frequency and hematuria.  Musculoskeletal:  Positive for arthralgias, back pain and gait problem. Negative for joint swelling and neck pain.  Skin:  Negative for rash.  Neurological:  Negative for dizziness, tremors, speech difficulty and weakness.  Psychiatric/Behavioral:  Negative for agitation, dysphoric mood, sleep disturbance and suicidal ideas. The patient is not nervous/anxious.     Objective:  BP 118/78   Pulse 62   Temp 98.6 F (37 C) (Oral)   Ht 5\' 4"  (1.626 m)   Wt 147 lb (66.7 kg)   SpO2 94%   BMI 25.23 kg/m   BP Readings from Last 3 Encounters:  11/24/23 118/78  11/21/23 132/62  09/26/23 (!) 140/82    Wt Readings from Last 3 Encounters:  11/24/23 147 lb (66.7 kg)  11/21/23 146 lb (66.2 kg)  09/26/23 146 lb (66.2 kg)    Physical Exam Constitutional:      General: He is not in acute  distress.    Appearance: He is well-developed.     Comments: NAD  Eyes:     Conjunctiva/sclera: Conjunctivae normal.     Pupils: Pupils are equal, round, and reactive to light.  Neck:     Thyroid : No thyromegaly.     Vascular: No JVD.  Cardiovascular:     Rate and Rhythm: Normal rate and regular rhythm.     Heart sounds: Normal heart sounds. No murmur heard.    No friction rub. No gallop.  Pulmonary:     Effort: Pulmonary effort is normal. No respiratory distress.     Breath sounds: Normal breath sounds. No wheezing or rales.  Chest:     Chest wall: No tenderness.  Abdominal:     General: Bowel sounds are normal. There is no distension.     Palpations: Abdomen is soft. There is no mass.     Tenderness: There is no abdominal tenderness. There is no guarding or rebound.  Musculoskeletal:        General: Tenderness present. Normal range of motion.     Cervical back: Normal range of motion.  Lymphadenopathy:     Cervical: No cervical adenopathy.  Skin:    General: Skin is warm and dry.     Findings: No rash.  Neurological:     Mental Status: He is alert and oriented to person, place, and time.     Cranial Nerves: No cranial nerve deficit.     Motor: No abnormal muscle tone.     Coordination: Coordination normal.     Gait: Gait abnormal.     Deep Tendon Reflexes: Reflexes are normal and symmetric.  Psychiatric:        Behavior: Behavior normal.        Thought Content: Thought content normal.        Judgment: Judgment normal.   Good ROM Painful hands  Lab Results  Component Value Date   WBC 5.8 09/26/2023   HGB 13.8 09/26/2023   HCT 40.1 09/26/2023   PLT 212.0 09/26/2023   GLUCOSE 169 (H) 09/26/2023   CHOL 157 09/06/2022   TRIG 93.0 09/06/2022   HDL 48.90 09/06/2022   LDLCALC 89 09/06/2022   ALT 18 09/26/2023   AST 12 09/26/2023   NA 139 09/26/2023   K 4.0 09/26/2023   CL 102 09/26/2023   CREATININE 0.98 09/26/2023   BUN 19 09/26/2023   CO2 28 09/26/2023    TSH 5.42 06/28/2023   PSA 2.17 03/26/2020   HGBA1C 7.2 (H) 09/26/2023   MICROALBUR 4.1 (H) 11/21/2023    DG Abd 1 View Result Date: 09/05/2023 CLINICAL DATA:  Right ureteral calculus, preop. EXAM: ABDOMEN - 1 VIEW COMPARISON:  Most recent radiograph  08/30/2023.  CT 05/31/2023 FINDINGS: 7 mm calcification projecting over the right sacrum is likely distal migration of ureteral calculus. Additional stable pelvic calcifications correspond to phleboliths and arterial vascular calcifications. Nonobstructive bowel gas pattern. Small volume of colonic stool. IMPRESSION: A 7 mm calcification projecting over the right sacrum is likely distal migration of ureteral calculus. Electronically Signed   By: Chadwick Colonel M.D.   On: 09/05/2023 10:10    Assessment & Plan:   Problem List Items Addressed This Visit     DM (diabetes mellitus), type 2 with complications (HCC)   On Metformin  bid Start Farxiga  5 mg d Improved diet      CRI (chronic renal insufficiency), stage 2 (mild)   On Olmesartan  Hydrate well: [place].      Microalbuminuria   Hard physical work      Low back pain   MSK - stable Referrals to chiro, ortho, PT were offered - pt declined      Weakness of both hips   Relevant Orders   Ambulatory referral to Physical Therapy   Other Visit Diagnoses       Pain in both lower extremities    -  Primary   Relevant Orders   Ambulatory referral to Physical Therapy     Primary osteoarthritis involving multiple joints       Relevant Orders   Ambulatory referral to Physical Therapy         No orders of the defined types were placed in this encounter.     Follow-up: Return in about 2 months (around 01/24/2024) for a follow-up visit.  Anitra Barn, MD

## 2023-11-24 NOTE — Assessment & Plan Note (Signed)
 On Metformin bid Start Farxiga 5 mg d Improved diet

## 2023-11-24 NOTE — Assessment & Plan Note (Signed)
 MSK - stable Referrals to chiro, ortho, PT were offered - pt declined

## 2023-11-24 NOTE — Patient Instructions (Addendum)
 Stop Ezitimibe for now Blue-Emu cream  use 2-3 times a day

## 2023-12-12 ENCOUNTER — Encounter: Payer: Self-pay | Admitting: Family Medicine

## 2023-12-12 ENCOUNTER — Ambulatory Visit: Admitting: Family Medicine

## 2023-12-12 VITALS — BP 134/86 | HR 61 | Temp 98.2°F | Resp 16 | Ht 64.0 in | Wt 146.0 lb

## 2023-12-12 DIAGNOSIS — L255 Unspecified contact dermatitis due to plants, except food: Secondary | ICD-10-CM

## 2023-12-12 DIAGNOSIS — N182 Chronic kidney disease, stage 2 (mild): Secondary | ICD-10-CM

## 2023-12-12 MED ORDER — PREDNISONE 10 MG (21) PO TBPK: ORAL_TABLET | ORAL | 0 refills | Status: AC

## 2023-12-12 NOTE — Patient Instructions (Addendum)
     Farxiga  is the medication we talked about for your kidneys.

## 2023-12-12 NOTE — Progress Notes (Signed)
 Assessment & Plan:  1. Rhus dermatitis (Primary) Education provided on poison ivy dermatitis.  Encouraged application of calamine lotion.  Recommended Tecnu wash if he is exposed in the future.  Area covered with dry dressing and secured with Coban. - predniSONE  (STERAPRED UNI-PAK 21 TAB) 10 MG (21) TBPK tablet; Use as directed.  Dispense: 21 each; Refill: 0  2. CRI (chronic renal insufficiency), stage 2 (mild) Upon questioning from patient we discussed his kidney function and protein in his urine.  Education provided on proteinuria.  Discussed the option of starting Farxiga .  He would like to discuss this with his PCP at his follow-up visit.    Follow up plan: Return for as scheduled with PCP.  Niki Rung, MSN, APRN, FNP-C  Subjective:  HPI: Ryan Ferguson is a 73 y.o. male presenting on 12/12/2023 for Poison Ivy (Poison ivy on right wrist x 7 days )  Patient has here due to poison ivy on his right wrist that first appeared 7 days ago.  No home treatments besides keeping it covered with a dressing due to the drainage.   ROS: Negative unless specifically indicated above in HPI.   Relevant past medical history reviewed and updated as indicated.   Allergies and medications reviewed and updated.   Current Outpatient Medications:    aspirin  EC 81 MG tablet, Take 1 tablet (81 mg total) by mouth daily., Disp: 90 tablet, Rfl: 3   cholecalciferol (VITAMIN D) 1000 UNITS tablet, Take 1,000 Units by mouth daily., Disp: , Rfl:    donepezil  (ARICEPT ) 10 MG tablet, Take 1 tablet (10 mg total) by mouth at bedtime., Disp: 90 tablet, Rfl: 3   ezetimibe  (ZETIA ) 10 MG tablet, Take 1 tablet by mouth once daily, Disp: 90 tablet, Rfl: 3   levothyroxine  (SYNTHROID ) 112 MCG tablet, TAKE 1 TABLET BY MOUTH IN THE MORNING, Disp: 90 tablet, Rfl: 1   LIVALO  1 MG TABS, Take 1 tablet by mouth once daily, Disp: 30 tablet, Rfl: 5   metFORMIN  (GLUCOPHAGE ) 1000 MG tablet, Take 1 tablet by mouth twice daily,  Disp: 180 tablet, Rfl: 3   olmesartan  (BENICAR ) 20 MG tablet, Take 1 tablet (20 mg total) by mouth 2 (two) times daily., Disp: 180 tablet, Rfl: 3   ondansetron  (ZOFRAN  ODT) 4 MG disintegrating tablet, Take 1 tablet (4 mg total) by mouth every 8 (eight) hours as needed for nausea or vomiting., Disp: 20 tablet, Rfl: 0   ONE TOUCH ULTRA TEST test strip, USE TEST STRIPS TO TEST BLOOD SUGAR ONCE DAILY, Disp: , Rfl: 4   tamsulosin  (FLOMAX ) 0.4 MG CAPS capsule, Take 1 capsule (0.4 mg total) by mouth daily., Disp: 7 capsule, Rfl: 0   famotidine  (PEPCID ) 40 MG tablet, Take 1 tablet by mouth once daily (Patient not taking: Reported on 12/12/2023), Disp: 90 tablet, Rfl: 0   nitroGLYCERIN  (NITROSTAT ) 0.4 MG SL tablet, DISSOLVE ONE TABLET UNDER THE TONGUE EVERY 5 MINUTES AS NEEDED FOR CHEST PAIN.  DO NOT EXCEED A TOTAL OF 3 DOSES IN 15 MINUTES (Patient not taking: Reported on 12/12/2023), Disp: 50 tablet, Rfl: 0   traMADol  (ULTRAM ) 50 MG tablet, Take 1-2 tablets (50-100 mg total) by mouth every 6 (six) hours as needed for moderate pain (pain score 4-6). (Patient not taking: Reported on 11/24/2023), Disp: 15 tablet, Rfl: 0   zolpidem  (AMBIEN ) 10 MG tablet, Take 0.5-1 tablets (5-10 mg total) by mouth at bedtime as needed for sleep. (Patient not taking: Reported on 12/12/2023), Disp: 30 tablet, Rfl:  3  Allergies  Allergen Reactions   Crestor  [Rosuvastatin ]     achy   Farxiga  [Dapagliflozin ]     Intolerance - itching, no rash   Hydrochlorothiazide  Other (See Comments)    Unknown reaction   Lipitor [Atorvastatin]     myalgias   Lisinopril  Cough   Ozempic  (0.25 Or 0.5 Mg-Dose) [Semaglutide (0.25 Or 0.5mg -Dos)]     Weak, nausea   Pravachol  [Pravastatin ] Other (See Comments)    Muscle pain (currently tolerating 20 mg vs. 40 mg)    Objective:   BP 134/86   Pulse 61   Temp 98.2 F (36.8 C)   Resp 16   Ht 5' 4 (1.626 m)   Wt 146 lb (66.2 kg)   SpO2 97%   BMI 25.06 kg/m    Physical Exam Vitals reviewed.   Constitutional:      General: He is not in acute distress.    Appearance: Normal appearance. He is not ill-appearing, toxic-appearing or diaphoretic.  HENT:     Head: Normocephalic and atraumatic.   Eyes:     General: No scleral icterus.       Right eye: No discharge.        Left eye: No discharge.     Conjunctiva/sclera: Conjunctivae normal.    Cardiovascular:     Rate and Rhythm: Normal rate.  Pulmonary:     Effort: Pulmonary effort is normal. No respiratory distress.   Musculoskeletal:        General: Normal range of motion.     Cervical back: Normal range of motion.   Skin:    General: Skin is warm and dry.     Findings: Rash present. Rash is vesicular (right wrist; no surrounding erythema, swelling, or warmth).   Neurological:     Mental Status: He is alert and oriented to person, place, and time. Mental status is at baseline.   Psychiatric:        Mood and Affect: Mood normal.        Behavior: Behavior normal.        Thought Content: Thought content normal.        Judgment: Judgment normal.

## 2023-12-12 NOTE — Therapy (Unsigned)
 OUTPATIENT PHYSICAL THERAPY LOWER EXTREMITY EVALUATION   Patient Name: Ryan Ferguson MRN: [id number] DOB:20-Apr-1951, 73 y.o., male Today's Date: 12/14/2023  END OF SESSION:  PT End of Session - 12/14/23 0744     Visit Number 1    Number of Visits 4    Date for PT Re-Evaluation 01/25/24    Authorization Type [employer]    PT Start Time 1230    PT Stop Time 1320    PT Time Calculation (min) 50 min    Activity Tolerance Patient tolerated treatment well    Behavior During Therapy WFL for tasks assessed/performed          Past Medical History:  Diagnosis Date   Chronic kidney disease    Colon polyps    Diabetes mellitus without complication (HCC)    does not check cbg daily   Hypertension    Hyperthyroidism    Kidney stones    Past Surgical History:  Procedure Laterality Date   APPENDECTOMY     age 52   CORONARY STENT INTERVENTION N/A 01/09/2021   Procedure: CORONARY STENT INTERVENTION;  Surgeon: Burnard Debby LABOR, MD;  Location: [facility];  Service: Cardiovascular;  Laterality: N/A;   CYSTOSCOPY     EXTRACORPOREAL SHOCK WAVE LITHOTRIPSY Right 09/05/2023   Procedure: LITHOTRIPSY, ESWL;  Surgeon: Cam Morene ORN, MD;  Location: [facility];  Service: Urology;  Laterality: Right;  RIGHT EXTRACORPOREAL SHOCKWAVE LITHOTRIPSY   LEFT HEART CATH AND CORONARY ANGIOGRAPHY N/A 01/09/2021   Procedure: LEFT HEART CATH AND CORONARY ANGIOGRAPHY;  Surgeon: Burnard Debby LABOR, MD;  Location: [facility];  Service: Cardiovascular;  Laterality: N/A;   LITHOTRIPSY     4-5 times   Patient Active Problem List   Diagnosis Date Noted   Hearing loss 06/28/2023   Melanocytic nevus of right lower extremity 04/28/2023   Right lower quadrant abdominal pain 04/28/2023   Weakness of both hips 04/28/2023   Memory loss 04/28/2023   Low back pain 12/07/2022   Elevated lipase 01/20/2022   Abdominal pain 01/20/2022   Statin intolerance 01/20/2022   Benign neoplasm of skin of  lower limb 11/09/2021   Disorder of musculoskeletal system 11/09/2021   Myalgia 11/09/2021   History of colon polyps 11/09/2021   Pure hypercholesterolemia 11/09/2021   Torticollis 11/09/2021   History of urinary stone 11/09/2021   Palpitations 10/27/2021   Bladder neck obstruction 09/08/2021   Hyperlipidemia 01/09/2021   Abnormal cardiac CT angiography    CAD (coronary artery disease), native coronary artery 12/03/2020   Neoplasm of uncertain behavior of skin 12/03/2020   Atherosclerosis of aorta (HCC) 12/03/2020   Chest pain, atypical 10/02/2020   Fatigue 10/02/2020   H. pylori infection 07/29/2020   GERD (gastroesophageal reflux disease) 05/26/2020   Hypothyroidism due to acquired atrophy of thyroid  03/28/2020   Angina pectoris associated with type 2 diabetes mellitus (HCC) 03/26/2020   Cerumen impaction 03/26/2020   Hypertension    CRI (chronic renal insufficiency), stage 2 (mild)    Hyperthyroidism    Nephrolithiasis    DM (diabetes mellitus), type 2 with complications (HCC) 11/27/2012   Adrenal mass, left (HCC) 11/21/2008   Microalbuminuria 11/21/2008    PCP: Plotnikov, Karlynn GAILS, MD  REFERRING PROVIDER: Garald Karlynn GAILS, MD   REFERRING DIAG: R29.898 (ICD-10-CM) - Weakness of both hips M79.604,M79.605 (ICD-10-CM) - Pain in both lower extremities M15.0 (ICD-10-CM) - Primary osteoarthritis involving multiple joints  THERAPY DIAG:  Pain in left leg  Pain in right leg  Rationale for Evaluation and Treatment: Rehabilitation  ONSET DATE: 3 yrs +  SUBJECTIVE:   SUBJECTIVE STATEMENT: PT reports to PT with cc of alt lower body weakness and muscle pain .  He has difficulty standing and lifting.  Sometimes bending over to tie his shoes.  Walking sometimes increases pain in muscle but it goes away.  I want to find the problem and fix it.   He does some exercises .  He is not working .  BAck pain not resolved.  Lifting dog food.  About 3 yrs ago it was bad and he did  exeries and it got bette.r  Back pain is not as often  . He hangs from a bar at home to help it.   PERTINENT HISTORY: Hip vs back pain Back Pain (Pt states he is having b/l sciatica pain off and that radiates down his legs at times. The pain stay long at times and at times is goes away after a few seconds. Pt states he notices this pain more when he is bending... Pt states if he is picking something up form the ground he notices his lower extremities become weak as he tries to hold the object on front of him.)     PAIN:  Are you having pain? Yes: NPRS scale: none today  Pain location: varies- calf, hamstring, quads and bar  Pain description:   Aggravating factors: lifting, standing  Relieving factors: shake it off, squatting to floor and gets back up.  Its different    PRECAUTIONS: None  RED FLAGS: None   WEIGHT BEARING RESTRICTIONS: No  FALLS:  Has patient fallen in last 6 months? No  LIVING ENVIRONMENT: Lives with: lives with their family Lives in: House/apartment Stairs: Yes: Internal: no issues  steps; on right going up Has following equipment at home: none  and None  OCCUPATION: physical labor, cleaning/maintenance   PLOF: Independent, Vocation/Vocational requirements: has a cabin, yardwork , and Leisure: outside   PATIENT GOALS:  want to figure out what this is   NEXT MD VISIT: unknown   OBJECTIVE:  Note: Objective measures were completed at Evaluation unless otherwise noted.  DIAGNOSTIC FINDINGS: none available   PATIENT SURVEYS:  LEFS  Extreme difficulty/unable (0), Quite a bit of difficulty (1), Moderate difficulty (2), Little difficulty (3), No difficulty (4) Survey date:    Any of your usual work, housework or school activities 4  2. Usual hobbies, recreational or sporting activities 4  3. Getting into/out of the bath 4  4. Walking between rooms 4  5. Putting on socks/shoes 4  6. Squatting  2  7. Lifting an object, like a bag of groceries from the floor  4  8. Performing light activities around your home 4  9. Performing heavy activities around your home 2  10. Getting into/out of a car 4  11. Walking 2 blocks 4  12. Walking 1 mile 3  13. Going up/down 10 stairs (1 flight) 4  14. Standing for 1 hour 2  15.  sitting for 1 hour 4  16. Running on even ground 4  17. Running on uneven ground 4  18. Making sharp turns while running fast 4  19. Hopping  4  20. Rolling over in bed 4  Score total:  73/80     COGNITION: Overall cognitive status: Within functional limits for tasks assessed     SENSATION: WFL  MUSCLE LENGTH: Hamstrings: tight  NT   POSTURE: WFL   PALPATION: Min pain  on Rt mid lumbar but only brief   TRUNK AROM WNL  Prone press up excellent  LOWER EXTREMITY ROM: WNL   Passive ROM Right eval Left eval  Hip flexion    Hip extension    Hip abduction    Hip adduction    Hip internal rotation    Hip external rotation    Knee flexion    Knee extension    Ankle dorsiflexion    Ankle plantarflexion    Ankle inversion    Ankle eversion     (Blank rows = not tested)  LOWER EXTREMITY MMT: WNL   MMT Right eval Left eval  Hip flexion    Hip extension    Hip abduction    Hip adduction    Hip internal rotation    Hip external rotation    Knee flexion    Knee extension    Ankle dorsiflexion    Ankle plantarflexion    Ankle inversion    Ankle eversion     (Blank rows = not tested)  LOWER EXTREMITY SPECIAL TESTS:  Hip special tests: none   FUNCTIONAL TESTS:  5 times sit to stand: NT   SLS 20 sec   GAIT: Distance walked: 150 Assistive device utilized: None Level of assistance: no issues  Comments: no deviations                                                                                                                                 TREATMENT DATE:   [facility] Adult PT Treatment:                                                DATE: 12/13/23 Self Care: Demo of body mechanics and lifting Hip  hinge vs squat Avoid bending spine with lifting    HEP   PATIENT EDUCATION:  Education details: see above  Person educated: Patient Education method: Explanation, Demonstration, and Handouts Education comprehension: verbalized understanding and needs further education  HOME EXERCISE PROGRAM: Access Code: [email or web address] URL: [email or web address] Date: 12/13/2023 Prepared by: Delon Norma  Exercises - Half Kneeling Hip Flexor Stretch  - 1 x daily - 7 x weekly - 1 sets - 5 reps - 30 hold - Prone Press Up On Elbows  - 1 x daily - 7 x weekly - 1 sets - 3 reps - 30 hold - Supine Bridge  - 1 x daily - 7 x weekly - 2 sets - 10 reps - 5 hold - Seated Hamstring Stretch  - 1 x daily - 7 x weekly - 1 sets - 5 reps - 30 hold - Standing Hip Hinge with Dowel  - 1 x daily - 7 x weekly - 2 sets - 10 reps - 5 hold - Sit to Stand  - 1 x daily -  7 x weekly - 2 sets - 10 reps - 30 hold - Seated Piriformis Stretch with Trunk Bend  - 1 x daily - 7 x weekly - 1 sets - 5 reps - 30 hold  Patient Education - Posture and Body Mechanics  ASSESSMENT:  CLINICAL IMPRESSION: Patient is a 73 y.o. male  who was seen today for physical therapy evaluation and treatment for muscle pain, sporadic. Pt is quite fit and demonstrated many exercises that he incorporates into his day.  Some of his moves with questionable regarding lack of control and excessive reps.  I educated him on taking his time, modifying for pain for symptoms and using proper lifting techniques. He initially was interested in just doing his HEP at home, but was convinced to return for review of concepts to ensure safety.   OBJECTIVE IMPAIRMENTS: decreased knowledge of condition, impaired flexibility, improper body mechanics, and pain.   ACTIVITY LIMITATIONS: carrying, lifting, and bending  PARTICIPATION LIMITATIONS: yard work  PERSONAL FACTORS: Time since onset of injury/illness/exacerbation and 1-2 comorbidities: OA, diabetes  are also  affecting patient's functional outcome.   REHAB POTENTIAL: Excellent  CLINICAL DECISION MAKING: Stable/uncomplicated  EVALUATION COMPLEXITY: Low   GOALS:   1.  Patient will be independent with final HEP upon discharge from PT and report consistent benefit following exercise completion.    Baseline:  Goal status: INITIAL  2.  Patient will be I with concepts of joint protection, posutre and body mechanics for lifting and home tasks.  Baseline:  Goal status: INITIAL   PLAN:  PT FREQUENCY: 2-4 visits total   PT DURATION: 6 weeks  PLANNED INTERVENTIONS: 97164- PT Re-evaluation, 97110-Therapeutic exercises, 97530- Therapeutic activity, [id number]- Neuromuscular re-education, 97535- Self Care, [id number]- Manual therapy, and Patient/Family education  PLAN FOR NEXT SESSION: lifting body mechanics, answer any questions about home routine.    Cambridge Deleo, PT 12/14/2023, 7:46 AM   Delon Norma, PT 12/14/23 7:47 AM Phone: [phone] Fax: [phone]

## 2023-12-13 ENCOUNTER — Ambulatory Visit: Attending: Internal Medicine | Admitting: Physical Therapy

## 2023-12-13 DIAGNOSIS — M150 Primary generalized (osteo)arthritis: Secondary | ICD-10-CM | POA: Diagnosis not present

## 2023-12-13 DIAGNOSIS — M79605 Pain in left leg: Secondary | ICD-10-CM | POA: Diagnosis not present

## 2023-12-13 DIAGNOSIS — R29898 Other symptoms and signs involving the musculoskeletal system: Secondary | ICD-10-CM | POA: Insufficient documentation

## 2023-12-13 DIAGNOSIS — M79604 Pain in right leg: Secondary | ICD-10-CM | POA: Diagnosis not present

## 2023-12-26 ENCOUNTER — Ambulatory Visit (INDEPENDENT_AMBULATORY_CARE_PROVIDER_SITE_OTHER): Admitting: Internal Medicine

## 2023-12-26 ENCOUNTER — Encounter: Payer: Self-pay | Admitting: Internal Medicine

## 2023-12-26 VITALS — BP 117/60 | HR 68 | Temp 98.3°F | Ht 64.0 in | Wt 149.0 lb

## 2023-12-26 DIAGNOSIS — G8929 Other chronic pain: Secondary | ICD-10-CM | POA: Diagnosis not present

## 2023-12-26 DIAGNOSIS — Z794 Long term (current) use of insulin: Secondary | ICD-10-CM

## 2023-12-26 DIAGNOSIS — E118 Type 2 diabetes mellitus with unspecified complications: Secondary | ICD-10-CM

## 2023-12-26 DIAGNOSIS — N182 Chronic kidney disease, stage 2 (mild): Secondary | ICD-10-CM

## 2023-12-26 DIAGNOSIS — M5441 Lumbago with sciatica, right side: Secondary | ICD-10-CM

## 2023-12-26 DIAGNOSIS — E1129 Type 2 diabetes mellitus with other diabetic kidney complication: Secondary | ICD-10-CM | POA: Diagnosis not present

## 2023-12-26 DIAGNOSIS — M5442 Lumbago with sciatica, left side: Secondary | ICD-10-CM | POA: Diagnosis not present

## 2023-12-26 DIAGNOSIS — R809 Proteinuria, unspecified: Secondary | ICD-10-CM

## 2023-12-26 DIAGNOSIS — I25118 Atherosclerotic heart disease of native coronary artery with other forms of angina pectoris: Secondary | ICD-10-CM

## 2023-12-26 DIAGNOSIS — E1159 Type 2 diabetes mellitus with other circulatory complications: Secondary | ICD-10-CM | POA: Diagnosis not present

## 2023-12-26 DIAGNOSIS — I10 Essential (primary) hypertension: Secondary | ICD-10-CM

## 2023-12-26 MED ORDER — KERENDIA 10 MG PO TABS: 10.0000 mg | ORAL_TABLET | Freq: Every day | ORAL | 11 refills | Status: DC

## 2023-12-26 NOTE — Assessment & Plan Note (Addendum)
 F/u w/Cardiology He still spends a lot of time [place] working outside. On Pitavastatin  - will hold due to memory issue Consider Repatha PharmD consult Avoid high heat

## 2023-12-26 NOTE — Assessment & Plan Note (Signed)
 No change Doing well

## 2023-12-26 NOTE — Assessment & Plan Note (Addendum)
  Microalb/Creat Ratio 50.4 - start Kerendia   New high glucose even 1 week after stopping steroids. CBG was 300. CBG 187 this am. Will watch - better

## 2023-12-26 NOTE — Assessment & Plan Note (Signed)
 Heat stroke prevention was discussed

## 2023-12-26 NOTE — Progress Notes (Signed)
 Subjective:  Patient ID: Ryan Ferguson, male    DOB: 1951-04-17  Age: 73 y.o. MRN: [id number]  CC: Medical Management of Chronic Issues (Discuss medications and review which meds he is to take.)   HPI Ryan Ferguson presents for high glucose even 1 week after stopping steroids. CBG was 300. CBG 187 this am F/u poison ivy dermatitis F/u CAD - no CP Planning to go to his cottage this week: Heat stroke prevention was discussed  Outpatient Medications Prior to Visit  Medication Sig Dispense Refill   aspirin  EC 81 MG tablet Take 1 tablet (81 mg total) by mouth daily. 90 tablet 3   cholecalciferol (VITAMIN D) 1000 UNITS tablet Take 1,000 Units by mouth daily.     donepezil  (ARICEPT ) 10 MG tablet Take 1 tablet (10 mg total) by mouth at bedtime. 90 tablet 3   ezetimibe  (ZETIA ) 10 MG tablet Take 1 tablet by mouth once daily 90 tablet 3   famotidine  (PEPCID ) 40 MG tablet Take 1 tablet by mouth once daily 90 tablet 0   levothyroxine  (SYNTHROID ) 112 MCG tablet TAKE 1 TABLET BY MOUTH IN THE MORNING 90 tablet 1   LIVALO  1 MG TABS Take 1 tablet by mouth once daily 30 tablet 5   metFORMIN  (GLUCOPHAGE ) 1000 MG tablet Take 1 tablet by mouth twice daily 180 tablet 3   nitroGLYCERIN  (NITROSTAT ) 0.4 MG SL tablet DISSOLVE ONE TABLET UNDER THE TONGUE EVERY 5 MINUTES AS NEEDED FOR CHEST PAIN.  DO NOT EXCEED A TOTAL OF 3 DOSES IN 15 MINUTES 50 tablet 0   olmesartan  (BENICAR ) 20 MG tablet Take 1 tablet (20 mg total) by mouth 2 (two) times daily. 180 tablet 3   ondansetron  (ZOFRAN  ODT) 4 MG disintegrating tablet Take 1 tablet (4 mg total) by mouth every 8 (eight) hours as needed for nausea or vomiting. 20 tablet 0   ONE TOUCH ULTRA TEST test strip USE TEST STRIPS TO TEST BLOOD SUGAR ONCE DAILY  4   predniSONE  (STERAPRED UNI-PAK 21 TAB) 10 MG (21) TBPK tablet Use as directed. 21 each 0   tamsulosin  (FLOMAX ) 0.4 MG CAPS capsule Take 1 capsule (0.4 mg total) by mouth daily. 7 capsule 0   traMADol  (ULTRAM ) 50  MG tablet Take 1-2 tablets (50-100 mg total) by mouth every 6 (six) hours as needed for moderate pain (pain score 4-6). 15 tablet 0   zolpidem  (AMBIEN ) 10 MG tablet Take 0.5-1 tablets (5-10 mg total) by mouth at bedtime as needed for sleep. 30 tablet 3   No facility-administered medications prior to visit.    ROS: Review of Systems  Constitutional:  Negative for appetite change, fatigue and unexpected weight change.  HENT:  Negative for congestion, nosebleeds, sneezing, sore throat and trouble swallowing.   Eyes:  Negative for itching and visual disturbance.  Respiratory:  Negative for cough.   Cardiovascular:  Negative for chest pain, palpitations and leg swelling.  Gastrointestinal:  Negative for abdominal distention, blood in stool, diarrhea and nausea.  Genitourinary:  Negative for frequency and hematuria.  Musculoskeletal:  Positive for arthralgias. Negative for back pain, gait problem, joint swelling and neck pain.  Skin:  Negative for rash.  Neurological:  Negative for dizziness, tremors, speech difficulty and weakness.  Psychiatric/Behavioral:  Negative for agitation, dysphoric mood and sleep disturbance. The patient is not nervous/anxious.     Objective:  BP 117/60   Pulse 68   Temp 98.3 F (36.8 C) (Oral)   Ht 5' 4 (1.626 m)  Wt 149 lb (67.6 kg)   SpO2 98%   BMI 25.58 kg/m   BP Readings from Last 3 Encounters:  12/26/23 117/60  12/12/23 134/86  11/24/23 118/78    Wt Readings from Last 3 Encounters:  12/26/23 149 lb (67.6 kg)  12/12/23 146 lb (66.2 kg)  11/24/23 147 lb (66.7 kg)    Physical Exam Constitutional:      General: He is not in acute distress.    Appearance: Normal appearance. He is well-developed.     Comments: NAD  Eyes:     Conjunctiva/sclera: Conjunctivae normal.     Pupils: Pupils are equal, round, and reactive to light.  Neck:     Thyroid : No thyromegaly.     Vascular: No JVD.  Cardiovascular:     Rate and Rhythm: Normal rate and  regular rhythm.     Heart sounds: Normal heart sounds. No murmur heard.    No friction rub. No gallop.  Pulmonary:     Effort: Pulmonary effort is normal. No respiratory distress.     Breath sounds: Normal breath sounds. No wheezing or rales.  Chest:     Chest wall: No tenderness.  Abdominal:     General: Bowel sounds are normal. There is no distension.     Palpations: Abdomen is soft. There is no mass.     Tenderness: There is no abdominal tenderness. There is no guarding or rebound.  Musculoskeletal:        General: No tenderness. Normal range of motion.     Cervical back: Normal range of motion.  Lymphadenopathy:     Cervical: No cervical adenopathy.  Skin:    General: Skin is warm and dry.     Findings: No rash.  Neurological:     Mental Status: He is alert and oriented to person, place, and time.     Cranial Nerves: No cranial nerve deficit.     Motor: No abnormal muscle tone.     Coordination: Coordination normal.     Gait: Gait normal.     Deep Tendon Reflexes: Reflexes are normal and symmetric.  Psychiatric:        Behavior: Behavior normal.        Thought Content: Thought content normal.        Judgment: Judgment normal.   No rash  Lab Results  Component Value Date   WBC 5.8 09/26/2023   HGB 13.8 09/26/2023   HCT 40.1 09/26/2023   PLT 212.0 09/26/2023   GLUCOSE 169 (H) 09/26/2023   CHOL 157 09/06/2022   TRIG 93.0 09/06/2022   HDL 48.90 09/06/2022   LDLCALC 89 09/06/2022   ALT 18 09/26/2023   AST 12 09/26/2023   NA 139 09/26/2023   K 4.0 09/26/2023   CL 102 09/26/2023   CREATININE 0.98 09/26/2023   BUN 19 09/26/2023   CO2 28 09/26/2023   TSH 5.42 06/28/2023   PSA 2.17 03/26/2020   HGBA1C 7.2 (H) 09/26/2023   MICROALBUR 4.1 (H) 11/21/2023    DG Abd 1 View Result Date: 09/05/2023 CLINICAL DATA:  Right ureteral calculus, preop. EXAM: ABDOMEN - 1 VIEW COMPARISON:  Most recent radiograph 08/30/2023.  CT 05/31/2023 FINDINGS: 7 mm calcification projecting  over the right sacrum is likely distal migration of ureteral calculus. Additional stable pelvic calcifications correspond to phleboliths and arterial vascular calcifications. Nonobstructive bowel gas pattern. Small volume of colonic stool. IMPRESSION: A 7 mm calcification projecting over the right sacrum is likely distal migration of ureteral calculus. Electronically  Signed   By: Andrea Gasman M.D.   On: 09/05/2023 10:10    Assessment & Plan:   Problem List Items Addressed This Visit     DM (diabetes mellitus), type 2 with complications (HCC)    Microalb/Creat Ratio 50.4 - start Kerendia   New high glucose even 1 week after stopping steroids. CBG was 300. CBG 187 this am. Will watch - better      Hypertension    Microalb/Creat Ratio 50.4 - start Kerendia        CRI (chronic renal insufficiency), stage 2 (mild)   Planning to go to his cottage this week: Heat stroke prevention was discussed      Angina pectoris associated with type 2 diabetes mellitus (HCC)   Heat stroke prevention was discussed      CAD (coronary artery disease), native coronary artery   F/u w/Cardiology He still spends a lot of time in his [place] working outside. On Pitavastatin  - will hold due to memory issue Consider Repatha PharmD consult Avoid high heat       Low back pain   No change. Doing well      Microalbuminuria due to type 2 diabetes mellitus (HCC) - Primary   Microalb/Creat Ratio 50.4 Sart Kerendia  if covered         Meds ordered this encounter  Medications   Finerenone  (KERENDIA ) 10 MG TABS    Sig: Take 1 tablet (10 mg total) by mouth daily.    Dispense:  28 tablet    Refill:  11    Microalb/Creat Ratio 50.4; DM, HTN      Follow-up: Return in about 3 months (around 03/27/2024) for a follow-up visit.  Marolyn Noel, MD

## 2023-12-26 NOTE — Assessment & Plan Note (Signed)
  Microalb/Creat Ratio 50.4 - start Kerendia

## 2023-12-26 NOTE — Assessment & Plan Note (Signed)
 Planning to go to his cottage this week: Heat stroke prevention was discussed

## 2023-12-26 NOTE — Assessment & Plan Note (Signed)
 Microalb/Creat Ratio 50.4 Sart Kerendia  if covered

## 2023-12-28 ENCOUNTER — Encounter: Payer: Self-pay | Admitting: Physical Therapy

## 2023-12-28 ENCOUNTER — Ambulatory Visit: Attending: Internal Medicine | Admitting: Physical Therapy

## 2023-12-28 DIAGNOSIS — M79604 Pain in right leg: Secondary | ICD-10-CM | POA: Insufficient documentation

## 2023-12-28 DIAGNOSIS — M79605 Pain in left leg: Secondary | ICD-10-CM | POA: Insufficient documentation

## 2023-12-28 NOTE — Therapy (Signed)
 OUTPATIENT PHYSICAL THERAPY LOWER EXTREMITY DISCHARGE   Patient Name: Ryan Ferguson MRN: [id number] DOB:1951/05/09, 73 y.o., male Today's Date: 12/28/2023  PHYSICAL THERAPY DISCHARGE SUMMARY  Visits from Start of Care: 2  Current functional level related to goals / functional outcomes: See below   Remaining deficits: NONE   Education / Equipment: Lifting , body mechanics, posture   Patient agrees to discharge. Patient goals were partially met. Patient is being discharged due to maximized rehab potential.     END OF SESSION:  PT End of Session - 12/28/23 0803     Visit Number 2    Number of Visits 4    Date for PT Re-Evaluation 01/25/24    Authorization Type [employer]    PT Start Time 0800    PT Stop Time 0845    PT Time Calculation (min) 45 min    Activity Tolerance Patient tolerated treatment well    Behavior During Therapy WFL for tasks assessed/performed           Past Medical History:  Diagnosis Date   Chronic kidney disease    Colon polyps    Diabetes mellitus without complication (HCC)    does not check cbg daily   Hypertension    Hyperthyroidism    Kidney stones    Past Surgical History:  Procedure Laterality Date   APPENDECTOMY     age 17   CORONARY STENT INTERVENTION N/A 01/09/2021   Procedure: CORONARY STENT INTERVENTION;  Surgeon: Burnard Debby LABOR, MD;  Location: [facility];  Service: Cardiovascular;  Laterality: N/A;   CYSTOSCOPY     EXTRACORPOREAL SHOCK WAVE LITHOTRIPSY Right 09/05/2023   Procedure: LITHOTRIPSY, ESWL;  Surgeon: Cam Morene ORN, MD;  Location: [facility];  Service: Urology;  Laterality: Right;  RIGHT EXTRACORPOREAL SHOCKWAVE LITHOTRIPSY   LEFT HEART CATH AND CORONARY ANGIOGRAPHY N/A 01/09/2021   Procedure: LEFT HEART CATH AND CORONARY ANGIOGRAPHY;  Surgeon: Burnard Debby LABOR, MD;  Location: [facility];  Service: Cardiovascular;  Laterality: N/A;   LITHOTRIPSY     4-5 times   Patient Active Problem  List   Diagnosis Date Noted   Microalbuminuria due to type 2 diabetes mellitus (HCC) 12/26/2023   Hearing loss 06/28/2023   Melanocytic nevus of right lower extremity 04/28/2023   Right lower quadrant abdominal pain 04/28/2023   Weakness of both hips 04/28/2023   Memory loss 04/28/2023   Low back pain 12/07/2022   Elevated lipase 01/20/2022   Abdominal pain 01/20/2022   Statin intolerance 01/20/2022   Benign neoplasm of skin of lower limb 11/09/2021   Disorder of musculoskeletal system 11/09/2021   Myalgia 11/09/2021   History of colon polyps 11/09/2021   Pure hypercholesterolemia 11/09/2021   Torticollis 11/09/2021   History of urinary stone 11/09/2021   Palpitations 10/27/2021   Bladder neck obstruction 09/08/2021   Hyperlipidemia 01/09/2021   Abnormal cardiac CT angiography    CAD (coronary artery disease), native coronary artery 12/03/2020   Neoplasm of uncertain behavior of skin 12/03/2020   Atherosclerosis of aorta (HCC) 12/03/2020   Chest pain, atypical 10/02/2020   Fatigue 10/02/2020   H. pylori infection 07/29/2020   GERD (gastroesophageal reflux disease) 05/26/2020   Hypothyroidism due to acquired atrophy of thyroid  03/28/2020   Angina pectoris associated with type 2 diabetes mellitus (HCC) 03/26/2020   Cerumen impaction 03/26/2020   Hypertension    CRI (chronic renal insufficiency), stage 2 (mild)    Hyperthyroidism    Nephrolithiasis    DM (  diabetes mellitus), type 2 with complications (HCC) 11/27/2012   Adrenal mass, left (HCC) 11/21/2008   Microalbuminuria 11/21/2008    PCP: Plotnikov, Karlynn GAILS, MD  REFERRING PROVIDER: Garald Karlynn GAILS, MD   REFERRING DIAG: R29.898 (ICD-10-CM) - Weakness of both hips M79.604,M79.605 (ICD-10-CM) - Pain in both lower extremities M15.0 (ICD-10-CM) - Primary osteoarthritis involving multiple joints  THERAPY DIAG:  Pain in left leg  Pain in right leg  Rationale for Evaluation and Treatment: Rehabilitation  ONSET  DATE: 3 yrs +  SUBJECTIVE:   SUBJECTIVE STATEMENT: Patient    PT reports to PT with cc of alt lower body weakness and muscle pain .  He has difficulty standing and lifting.  Sometimes bending over to tie his shoes.  Walking sometimes increases pain in muscle but it goes away.  I want to find the problem and fix it.   He does some exercises .  He is not working .  Back pain not resolved.  Lifting dog food.  About 3 yrs ago it was bad and he did exercies and it got better. Back pain is not as often  . He hangs from a bar at home to help it.   PERTINENT HISTORY: Hip vs back pain Back Pain (Pt states he is having b/l sciatica pain off and that radiates down his legs at times. The pain stay long at times and at times is goes away after a few seconds. Pt states he notices this pain more when he is bending... Pt states if he is picking something up form the ground he notices his lower extremities become weak as he tries to hold the object on front of him.)     PAIN:  Are you having pain? Yes: NPRS scale: none today  Pain location: varies- calf, hamstring, quads and bar  Pain description:   Aggravating factors: lifting, standing  Relieving factors: shake it off, squatting to floor and gets back up.  Its different    PRECAUTIONS: None  RED FLAGS: None   WEIGHT BEARING RESTRICTIONS: No  FALLS:  Has patient fallen in last 6 months? No  LIVING ENVIRONMENT: Lives with: lives with their family Lives in: House/apartment Stairs: Yes: Internal: no issues  steps; on right going up Has following equipment at home: none  and None  OCCUPATION: physical labor, cleaning/maintenance   PLOF: Independent, Vocation/Vocational requirements: has a cabin, yardwork , and Leisure: outside   PATIENT GOALS:  want to figure out what this is   NEXT MD VISIT: unknown   OBJECTIVE:  Note: Objective measures were completed at Evaluation unless otherwise noted.  DIAGNOSTIC FINDINGS: none available    PATIENT SURVEYS:  LEFS  Extreme difficulty/unable (0), Quite a bit of difficulty (1), Moderate difficulty (2), Little difficulty (3), No difficulty (4) Survey date:    Any of your usual work, housework or school activities 4  2. Usual hobbies, recreational or sporting activities 4  3. Getting into/out of the bath 4  4. Walking between rooms 4  5. Putting on socks/shoes 4  6. Squatting  2  7. Lifting an object, like a bag of groceries from the floor 4  8. Performing light activities around your home 4  9. Performing heavy activities around your home 2  10. Getting into/out of a car 4  11. Walking 2 blocks 4  12. Walking 1 mile 3  13. Going up/down 10 stairs (1 flight) 4  14. Standing for 1 hour 2  15.  sitting for 1  hour 4  16. Running on even ground 4  17. Running on uneven ground 4  18. Making sharp turns while running fast 4  19. Hopping  4  20. Rolling over in bed 4  Score total:  73/80     COGNITION: Overall cognitive status: Within functional limits for tasks assessed     SENSATION: WFL  MUSCLE LENGTH: Hamstrings: tight  NT   POSTURE: WFL   PALPATION: Min pain on Rt mid lumbar but only brief   TRUNK AROM WNL  Prone press up excellent  LOWER EXTREMITY ROM: WNL   Passive ROM Right eval Left eval  Hip flexion    Hip extension    Hip abduction    Hip adduction    Hip internal rotation    Hip external rotation    Knee flexion    Knee extension    Ankle dorsiflexion    Ankle plantarflexion    Ankle inversion    Ankle eversion     (Blank rows = not tested)  LOWER EXTREMITY MMT: WNL   MMT Right eval Left eval  Hip flexion    Hip extension    Hip abduction    Hip adduction    Hip internal rotation    Hip external rotation    Knee flexion    Knee extension    Ankle dorsiflexion    Ankle plantarflexion    Ankle inversion    Ankle eversion     (Blank rows = not tested)  LOWER EXTREMITY SPECIAL TESTS:  Hip special tests: none    FUNCTIONAL TESTS:  5 times sit to stand: NT   SLS 20 sec   GAIT: Distance walked: 150 Assistive device utilized: None Level of assistance: no issues  Comments: no deviations                                                                                                                                 TREATMENT DATE:  [facility] Adult PT Treatment:                                                DATE: 12/28/23 Therapeutic Exercise: NuStep L5 UE and LE 4 min (I don't like this)  Half Kneeling Hip Flexor Stretch Prone Press Up On Elbows x 5, 10-15 sec Prone hip extension x 10 cues for control  Corner Stretch x 3  Knee to chest 30 sec x 3  Supine Bridge  x 10  Supine Hamstring Stretch 30 sec x 3 Therapeutic Activity: Extensive cues needed for technique and reinforcement of the principles of control and connecting to the moment, body mechanics  Standing Hip Hinge 25 lbs x 10 x 2 mod cues   [facility] Adult PT Treatment:  DATE: 12/13/23 Self Care: Demo of body mechanics and lifting Hip hinge vs squat Avoid bending spine with lifting    HEP   PATIENT EDUCATION:  Education details: see above  Person educated: Patient Education method: Explanation, Demonstration, and Handouts Education comprehension: verbalized understanding and needs further education  HOME EXERCISE PROGRAM: Access Code: [email or web address] URL: [email or web address] Date: 12/28/2023 Prepared by: Delon Norma  Exercises - Half Kneeling Hip Flexor Stretch  - 1 x daily - 7 x weekly - 1 sets - 5 reps - 30 hold - Prone Press Up On Elbows  - 1 x daily - 7 x weekly - 1 sets - 3 reps - 30 hold - Supine Bridge  - 1 x daily - 7 x weekly - 2 sets - 10 reps - 5 hold - Standing Hip Hinge with Dowel  - 1 x daily - 7 x weekly - 2 sets - 10 reps - 5 hold - Seated Piriformis Stretch with Trunk Bend  - 1 x daily - 7 x weekly - 1 sets - 5 reps - 30 hold - Supine Hamstring Stretch with  Strap  - 1 x daily - 7 x weekly - 1 sets - 3-5 reps - 30 hold - Child's Pose Stretch  - 1 x daily - 7 x weekly - 1 sets - 3-5 reps - 30 hold - Corner stretch  Patient Education - Posture and Body Mechanics  ASSESSMENT:  CLINICAL IMPRESSION: Patient was seen today to check in about carry over and form with his lifting an HEP.  He did well and his routine is serving him at this time. We went into how doing less may serve him better when it comes to his back pain .  We addressed stretches for his chest ( does 100 pushups at a time) and how hip extension may aggravate pain based on how he moved today.  I told him to continue his current program but with Less intensity and Less repetitions. I also gave him stretches to do AFTER he does more intense strengthening. Recommend he adopt a more moderate approach to fitness and listen to his body.  DC from PT>     Patient is a 73 y.o. male  who was seen today for physical therapy evaluation and treatment for muscle pain, sporadic. Pt is quite fit and demonstrated many exercises that he incorporates into his day.  Some of his moves with questionable regarding lack of control and excessive reps.  I educated him on taking his time, modifying for pain for symptoms and using proper lifting techniques. He initially was interested in just doing his HEP at home, but was convinced to return for review of concepts to ensure safety.   OBJECTIVE IMPAIRMENTS: decreased knowledge of condition, impaired flexibility, improper body mechanics, and pain.   ACTIVITY LIMITATIONS: carrying, lifting, and bending  PARTICIPATION LIMITATIONS: yard work  PERSONAL FACTORS: Time since onset of injury/illness/exacerbation and 1-2 comorbidities: OA, diabetes  are also affecting patient's functional outcome.   REHAB POTENTIAL: Excellent  CLINICAL DECISION MAKING: Stable/uncomplicated  EVALUATION COMPLEXITY: Low   GOALS:   1.  Patient will be independent with final HEP upon  discharge from PT and report consistent benefit following exercise completion.    Baseline:  Goal status:partially met   2.  Patient will be I with concepts of joint protection, posutre and body mechanics for lifting and home tasks.  Baseline:  Goal status: partially met    PLAN:  PT FREQUENCY: 2-4 visits total  PT DURATION: 6 weeks  PLANNED INTERVENTIONS: 97164- PT Re-evaluation, 97110-Therapeutic exercises, 97530- Therapeutic activity, [id number]- Neuromuscular re-education, 97535- Self Care, 02859- Manual therapy, and Patient/Family education  PLAN FOR NEXT SESSION: lifting body mechanics, answer any questions about home routine.    Knowledge Escandon, PT 12/28/2023, 8:05 AM   Delon Norma, PT 12/28/23 8:05 AM Phone: [phone] Fax: [phone]

## 2023-12-29 ENCOUNTER — Telehealth: Payer: Self-pay

## 2023-12-29 DIAGNOSIS — E118 Type 2 diabetes mellitus with unspecified complications: Secondary | ICD-10-CM

## 2023-12-29 NOTE — Telephone Encounter (Signed)
 Copied from CRM [id number]. Topic: Clinical - Prescription Issue >> Dec 28, 2023  4:41 PM Chiquita SQUIBB wrote: Reason for CRM: Patients wife is calling in with issues getting the device to check the patients blood sugar and would like the nurse or Dr. Garald to call her back.

## 2023-12-30 MED ORDER — BLOOD GLUCOSE MONITOR KIT: PACK | 0 refills | Status: AC

## 2023-12-30 NOTE — Telephone Encounter (Signed)
 Spoke with pts wife and was able to verify that pt is needing One Touch Glucometer Use One Touch Verio sent into pharmacy to check blood sugars 2 times a day.

## 2024-01-03 ENCOUNTER — Other Ambulatory Visit (HOSPITAL_COMMUNITY): Payer: Self-pay

## 2024-01-03 ENCOUNTER — Telehealth: Payer: Self-pay

## 2024-01-03 NOTE — Telephone Encounter (Signed)
 Pharmacy Patient Advocate Encounter   Received notification from CoverMyMeds that prior authorization for Kerendia  10 tabs is required/requested.   Insurance verification completed.   The patient is insured through [facility] AD[employer]/RX [employer] .   Per test claim: PA required; PA submitted to above mentioned insurance via CoverMyMeds Key/confirmation #/EOC [facility] Status is pending

## 2024-01-05 ENCOUNTER — Other Ambulatory Visit: Payer: Self-pay | Admitting: Internal Medicine

## 2024-01-05 ENCOUNTER — Other Ambulatory Visit (HOSPITAL_COMMUNITY): Payer: Self-pay

## 2024-01-05 NOTE — Telephone Encounter (Signed)
 Pharmacy Patient Advocate Encounter  Received notification from [employer] that Prior Authorization for Kerendia  10 has been APPROVED from 01/04/24 to 01/03/25. Ran test claim, Copay is $100.00 for 28 tablets. This test claim was processed through [facility]- copay amounts may vary at other pharmacies due to pharmacy/plan contracts, or as the patient moves through the different stages of their insurance plan.   PA #/Case ID/Reference #: [id number]

## 2024-03-08 ENCOUNTER — Ambulatory Visit: Payer: Self-pay

## 2024-03-08 NOTE — Telephone Encounter (Signed)
 FYI Only or Action Required?: Action required by provider: Requesting lab work and urine tests before appointment.  Patient was last seen in primary care on 12/26/2023 by Plotnikov, Karlynn GAILS, MD.  Called Nurse Triage reporting Hypertension.  Symptoms began several days ago.  Interventions attempted: Prescription medications: BP meds.  Symptoms are: gradually worsening.  Triage Disposition: See PCP When Office is Open (Within 3 Days)  Patient/caregiver understands and will follow disposition?: Yes      Copied from CRM [id number]. Topic: Clinical - Red Word Triage >> Mar 08, 2024 10:30 AM [place] A wrote: [facility] that prompted transfer to Nurse Triage: Patient's wife called patient High Blood Pressure 190/100, kidney problems Reason for Disposition  Systolic BP >= 160 OR Diastolic >= 100  Answer Assessment - Initial Assessment Questions This RN spoke with the patient's wife regarding symptoms. Patient not currently out of town and this RN urged pt to go to ED for worsening symptoms. Patient has been having elevated BP for awhile according to his wife. Wife unsure about when exactly BP started to elevate. . Patient's wife is requesting patient to come in early for urine/ lab tests before appointment. She would like a call back at [phone] regarding the request.   03/04/2024 BP was 190/100.   03/07/2024 BP was 170/97  1. BLOOD PRESSURE: What is your blood pressure? Did you take at least two measurements 5 minutes apart?     Readings from multiple days  2. ONSET: When did you take your blood pressure?     Sunday  3. HOW: How did you take your blood pressure? (e.g., automatic home BP monitor, visiting nurse)     Automatic home BP cuff  4. HISTORY: Do you have a history of high blood pressure?     Yes  5. MEDICINES: Are you taking any medicines for blood pressure? Have you missed any doses recently?     Denies  6. OTHER SYMPTOMS: Do you have any symptoms? (e.g.,  blurred vision, chest pain, difficulty breathing, headache, weakness)     Dizziness and headaches  Protocols used: Blood Pressure - High-A-AH

## 2024-03-12 ENCOUNTER — Other Ambulatory Visit: Payer: Self-pay

## 2024-03-12 ENCOUNTER — Telehealth: Payer: Self-pay

## 2024-03-12 DIAGNOSIS — I25118 Atherosclerotic heart disease of native coronary artery with other forms of angina pectoris: Secondary | ICD-10-CM

## 2024-03-12 DIAGNOSIS — E118 Type 2 diabetes mellitus with unspecified complications: Secondary | ICD-10-CM

## 2024-03-12 DIAGNOSIS — R5383 Other fatigue: Secondary | ICD-10-CM

## 2024-03-12 NOTE — Telephone Encounter (Signed)
 Patient wife states patient has dizzyness, high blood sugar, and blood pressure is high. Patient and wife are requesting lab work to be done before his appointment  this Wednesday. Please review and order labs if necessary. Please let me know.

## 2024-03-13 ENCOUNTER — Other Ambulatory Visit (INDEPENDENT_AMBULATORY_CARE_PROVIDER_SITE_OTHER)

## 2024-03-13 ENCOUNTER — Other Ambulatory Visit: Payer: Self-pay

## 2024-03-13 DIAGNOSIS — R5383 Other fatigue: Secondary | ICD-10-CM | POA: Diagnosis not present

## 2024-03-13 DIAGNOSIS — E118 Type 2 diabetes mellitus with unspecified complications: Secondary | ICD-10-CM

## 2024-03-13 DIAGNOSIS — I25118 Atherosclerotic heart disease of native coronary artery with other forms of angina pectoris: Secondary | ICD-10-CM

## 2024-03-13 DIAGNOSIS — R809 Proteinuria, unspecified: Secondary | ICD-10-CM

## 2024-03-13 LAB — CBC WITH DIFFERENTIAL/PLATELET
Basophils Absolute: 0 K/uL (ref 0.0–0.1)
Basophils Relative: 0.7 % (ref 0.0–3.0)
Eosinophils Absolute: 0.1 K/uL (ref 0.0–0.7)
Eosinophils Relative: 2.2 % (ref 0.0–5.0)
HCT: 41.6 % (ref 39.0–52.0)
Hemoglobin: 13.7 g/dL (ref 13.0–17.0)
Lymphocytes Relative: 46.1 % — ABNORMAL HIGH (ref 12.0–46.0)
Lymphs Abs: 2.5 K/uL (ref 0.7–4.0)
MCHC: 32.9 g/dL (ref 30.0–36.0)
MCV: 92.9 fl (ref 78.0–100.0)
Monocytes Absolute: 0.5 K/uL (ref 0.1–1.0)
Monocytes Relative: 9 % (ref 3.0–12.0)
Neutro Abs: 2.3 K/uL (ref 1.4–7.7)
Neutrophils Relative %: 42 % — ABNORMAL LOW (ref 43.0–77.0)
Platelets: 234 K/uL (ref 150.0–400.0)
RBC: 4.47 Mil/uL (ref 4.22–5.81)
RDW: 13.4 % (ref 11.5–15.5)
WBC: 5.4 K/uL (ref 4.0–10.5)

## 2024-03-13 LAB — COMPREHENSIVE METABOLIC PANEL WITH GFR
ALT: 16 U/L (ref 0–53)
AST: 15 U/L (ref 0–37)
Albumin: 4.6 g/dL (ref 3.5–5.2)
Alkaline Phosphatase: 46 U/L (ref 39–117)
BUN: 26 mg/dL — ABNORMAL HIGH (ref 6–23)
CO2: 26 meq/L (ref 19–32)
Calcium: 10.3 mg/dL (ref 8.4–10.5)
Chloride: 101 meq/L (ref 96–112)
Creatinine, Ser: 1.24 mg/dL (ref 0.40–1.50)
GFR: 57.65 mL/min — ABNORMAL LOW (ref >60.00)
Glucose, Bld: 116 mg/dL — ABNORMAL HIGH (ref 70–99)
Potassium: 4.3 meq/L (ref 3.5–5.1)
Sodium: 138 meq/L (ref 135–145)
Total Bilirubin: 0.4 mg/dL (ref 0.2–1.2)
Total Protein: 7.1 g/dL (ref 6.0–8.3)

## 2024-03-13 LAB — MICROALBUMIN / CREATININE URINE RATIO
Creatinine,U: 108.3 mg/dL
Microalb Creat Ratio: 52.7 mg/g — ABNORMAL HIGH (ref 0.0–30.0)
Microalb, Ur: 5.7 mg/dL — ABNORMAL HIGH (ref 0.0–1.9)

## 2024-03-13 LAB — HEMOGLOBIN A1C: Hgb A1c MFr Bld: 7.6 % — ABNORMAL HIGH (ref 4.6–6.5)

## 2024-03-13 NOTE — Telephone Encounter (Signed)
 Okay.  Labs were ordered.  Office visit with any provider's if problems.  Thanks

## 2024-03-13 NOTE — Addendum Note (Signed)
 Addended by: CLAUDENE BOBBETTE RAMAN on: 03/13/2024 12:05 PM   Modules accepted: Orders

## 2024-03-14 ENCOUNTER — Encounter: Payer: Self-pay | Admitting: Internal Medicine

## 2024-03-14 ENCOUNTER — Ambulatory Visit: Admitting: Internal Medicine

## 2024-03-14 VITALS — BP 150/86 | HR 63 | Temp 98.6°F | Ht 66.25 in | Wt 144.6 lb

## 2024-03-14 DIAGNOSIS — E7800 Pure hypercholesterolemia, unspecified: Secondary | ICD-10-CM

## 2024-03-14 DIAGNOSIS — E1129 Type 2 diabetes mellitus with other diabetic kidney complication: Secondary | ICD-10-CM | POA: Diagnosis not present

## 2024-03-14 DIAGNOSIS — R809 Proteinuria, unspecified: Secondary | ICD-10-CM | POA: Diagnosis not present

## 2024-03-14 DIAGNOSIS — E118 Type 2 diabetes mellitus with unspecified complications: Secondary | ICD-10-CM

## 2024-03-14 DIAGNOSIS — Z794 Long term (current) use of insulin: Secondary | ICD-10-CM | POA: Diagnosis not present

## 2024-03-14 DIAGNOSIS — I10 Essential (primary) hypertension: Secondary | ICD-10-CM | POA: Diagnosis not present

## 2024-03-14 DIAGNOSIS — R413 Other amnesia: Secondary | ICD-10-CM | POA: Diagnosis not present

## 2024-03-14 DIAGNOSIS — N182 Chronic kidney disease, stage 2 (mild): Secondary | ICD-10-CM

## 2024-03-14 MED ORDER — LIVALO 1 MG PO TABS: 1.0000 | ORAL_TABLET | Freq: Every day | ORAL | 3 refills | Status: AC

## 2024-03-14 MED ORDER — AMLODIPINE BESYLATE 5 MG PO TABS: 5.0000 mg | ORAL_TABLET | Freq: Every day | ORAL | 3 refills | Status: AC

## 2024-03-14 MED ORDER — KERENDIA 20 MG PO TABS: 20.0000 mg | ORAL_TABLET | Freq: Every day | ORAL | 1 refills | Status: AC

## 2024-03-14 NOTE — Progress Notes (Signed)
 Subjective:  Patient ID: Ryan Ferguson, male    DOB: [birth year]  Age: 73 y.o. MRN: [id number]  CC: Hypertension (Blood pressure has been high. Doesn't know when it started getting high but started taking BP and has noticed it being elevated. )   HPI Ryan Ferguson presents for SBP=150-180 at home F/u DM - better on diet   Outpatient Medications Prior to Visit  Medication Sig Dispense Refill   aspirin  EC 81 MG tablet Take 1 tablet (81 mg total) by mouth daily. 90 tablet 3   blood glucose meter kit and supplies KIT Use One Touch Verio to check blood sugars 2 times a day. 1 each 0   cholecalciferol (VITAMIN D) 1000 UNITS tablet Take 1,000 Units by mouth daily.     donepezil  (ARICEPT ) 10 MG tablet Take 1 tablet (10 mg total) by mouth at bedtime. 90 tablet 3   famotidine  (PEPCID ) 40 MG tablet Take 1 tablet by mouth once daily 90 tablet 0   levothyroxine  (SYNTHROID ) 112 MCG tablet TAKE 1 TABLET BY MOUTH IN THE MORNING 90 tablet 0   metFORMIN  (GLUCOPHAGE ) 1000 MG tablet Take 1 tablet by mouth twice daily 180 tablet 3   nitroGLYCERIN  (NITROSTAT ) 0.4 MG SL tablet DISSOLVE ONE TABLET UNDER THE TONGUE EVERY 5 MINUTES AS NEEDED FOR CHEST PAIN.  DO NOT EXCEED A TOTAL OF 3 DOSES IN 15 MINUTES 50 tablet 0   olmesartan  (BENICAR ) 20 MG tablet Take 1 tablet (20 mg total) by mouth 2 (two) times daily. 180 tablet 3   ondansetron  (ZOFRAN  ODT) 4 MG disintegrating tablet Take 1 tablet (4 mg total) by mouth every 8 (eight) hours as needed for nausea or vomiting. 20 tablet 0   ONE TOUCH ULTRA TEST test strip USE TEST STRIPS TO TEST BLOOD SUGAR ONCE DAILY  4   predniSONE  (STERAPRED UNI-PAK 21 TAB) 10 MG (21) TBPK tablet Use as directed. 21 each 0   tamsulosin  (FLOMAX ) 0.4 MG CAPS capsule Take 1 capsule (0.4 mg total) by mouth daily. 7 capsule 0   traMADol  (ULTRAM ) 50 MG tablet Take 1-2 tablets (50-100 mg total) by mouth every 6 (six) hours as needed for moderate pain (pain score 4-6). 15 tablet 0   zolpidem   (AMBIEN ) 10 MG tablet Take 0.5-1 tablets (5-10 mg total) by mouth at bedtime as needed for sleep. 30 tablet 3   ezetimibe  (ZETIA ) 10 MG tablet Take 1 tablet by mouth once daily 90 tablet 3   Finerenone  (KERENDIA ) 10 MG TABS Take 1 tablet (10 mg total) by mouth daily. 28 tablet 11   LIVALO  1 MG TABS Take 1 tablet by mouth once daily 30 tablet 5   No facility-administered medications prior to visit.    ROS: Review of Systems  Constitutional:  Negative for appetite change, fatigue and unexpected weight change.  HENT:  Negative for congestion, nosebleeds, sneezing, sore throat and trouble swallowing.   Eyes:  Negative for itching and visual disturbance.  Respiratory:  Negative for cough.   Cardiovascular:  Negative for chest pain, palpitations and leg swelling.  Gastrointestinal:  Negative for abdominal distention, blood in stool, diarrhea and nausea.  Genitourinary:  Negative for frequency and hematuria.  Musculoskeletal:  Positive for arthralgias. Negative for back pain, gait problem, joint swelling and neck pain.  Skin:  Negative for rash.  Neurological:  Negative for dizziness, tremors, speech difficulty and weakness.  Hematological:  Does not bruise/bleed easily.  Psychiatric/Behavioral:  Positive for decreased concentration. Negative for agitation, dysphoric  mood and sleep disturbance. The patient is not nervous/anxious.     Objective:  BP (!) 150/86   Pulse 63   Temp 98.6 F (37 C) (Oral)   Ht 5' 6.25 (1.683 m)   Wt 144 lb 9.6 oz (65.6 kg)   SpO2 97%   BMI 23.16 kg/m   BP Readings from Last 3 Encounters:  03/14/24 (!) 150/86  12/26/23 117/60  12/12/23 134/86    Wt Readings from Last 3 Encounters:  03/14/24 144 lb 9.6 oz (65.6 kg)  12/26/23 149 lb (67.6 kg)  12/12/23 146 lb (66.2 kg)    Physical Exam Constitutional:      General: He is not in acute distress.    Appearance: He is well-developed.     Comments: NAD  Eyes:     Conjunctiva/sclera: Conjunctivae  normal.     Pupils: Pupils are equal, round, and reactive to light.  Neck:     Thyroid : No thyromegaly.     Vascular: No JVD.  Cardiovascular:     Rate and Rhythm: Normal rate and regular rhythm.     Heart sounds: Normal heart sounds. No murmur heard.    No friction rub. No gallop.  Pulmonary:     Effort: Pulmonary effort is normal. No respiratory distress.     Breath sounds: Normal breath sounds. No wheezing or rales.  Chest:     Chest wall: No tenderness.  Abdominal:     General: Bowel sounds are normal. There is no distension.     Palpations: Abdomen is soft. There is no mass.     Tenderness: There is no abdominal tenderness. There is no guarding or rebound.  Musculoskeletal:        General: No tenderness. Normal range of motion.     Cervical back: Normal range of motion.  Lymphadenopathy:     Cervical: No cervical adenopathy.  Skin:    General: Skin is warm and dry.     Findings: No rash.  Neurological:     Mental Status: He is alert and oriented to person, place, and time.     Cranial Nerves: No cranial nerve deficit.     Motor: No abnormal muscle tone.     Coordination: Coordination normal.     Gait: Gait normal.     Deep Tendon Reflexes: Reflexes are normal and symmetric.  Psychiatric:        Behavior: Behavior normal.        Thought Content: Thought content normal.        Judgment: Judgment normal.     Lab Results  Component Value Date   WBC 5.4 03/13/2024   HGB 13.7 03/13/2024   HCT 41.6 03/13/2024   PLT 234.0 03/13/2024   GLUCOSE 116 (H) 03/13/2024   CHOL 157 09/06/2022   TRIG 93.0 09/06/2022   HDL 48.90 09/06/2022   LDLCALC 89 09/06/2022   ALT 16 03/13/2024   AST 15 03/13/2024   NA 138 03/13/2024   K 4.3 03/13/2024   CL 101 03/13/2024   CREATININE 1.24 03/13/2024   BUN 26 (H) 03/13/2024   CO2 26 03/13/2024   TSH 5.42 06/28/2023   PSA 2.17 03/26/2020   HGBA1C 7.6 (H) 03/13/2024   MICROALBUR 5.7 (H) 03/13/2024    DG Abd 1 View Result Date:  09/05/2023 CLINICAL DATA:  Right ureteral calculus, preop. EXAM: ABDOMEN - 1 VIEW COMPARISON:  Most recent radiograph 08/30/2023.  CT 05/31/2023 FINDINGS: 7 mm calcification projecting over the right sacrum is likely distal  migration of ureteral calculus. Additional stable pelvic calcifications correspond to phleboliths and arterial vascular calcifications. Nonobstructive bowel gas pattern. Small volume of colonic stool. IMPRESSION: A 7 mm calcification projecting over the right sacrum is likely distal migration of ureteral calculus. Electronically Signed   By: Andrea Gasman M.D.   On: 09/05/2023 10:10    Assessment & Plan:   Problem List Items Addressed This Visit     CRI (chronic renal insufficiency), stage 2 (mild) - Primary    Microalb/Creat Ratio 50.4 - increase Kerendia  to 20 mg a day. Nephrology ref       Relevant Orders   Ambulatory referral to Nephrology   Comprehensive metabolic panel with GFR   DM (diabetes mellitus), type 2 with complications (HCC)    Microalb/Creat Ratio 50.4 - start Kerendia  - increase Kerendia  to 20 mg a day. Nephrology ref  New high glucose even 1 week after stopping steroids. CBG was 300. CBG 187 this am. Will watch - better      Relevant Medications   LIVALO  1 MG TABS   Other Relevant Orders   Ambulatory referral to Nephrology   Comprehensive metabolic panel with GFR   Hyperlipidemia   Poor statin tolerance Ok to re-start Livalo   if tolerated Not taking Zetia       Relevant Medications   amLODipine  (NORVASC ) 5 MG tablet   LIVALO  1 MG TABS   Hypertension   Poor control. SBP=150-180 at home. Added Norvasc  5 mg/d On Olmesartan  20 mg bid Microalb/Creat Ratio 50.4 - start Kerendia  - increase Kerendia  to 20 mg a day.  Nephrology ref       Relevant Medications   amLODipine  (NORVASC ) 5 MG tablet   LIVALO  1 MG TABS   Memory loss   Stable  Neurology ref was offered On Aricept  Discussed the need for a hearing test/possibly hearing aids       Microalbuminuria due to type 2 diabetes mellitus (HCC)   Microalb/Creat Ratio 50.4 - start Kerendia  - increase Kerendia  to 20 mg a day. Nephrology ref       Relevant Medications   LIVALO  1 MG TABS   Other Relevant Orders   Ambulatory referral to Nephrology   Comprehensive metabolic panel with GFR      Meds ordered this encounter  Medications   Finerenone  (KERENDIA ) 20 MG TABS    Sig: Take 1 tablet (20 mg total) by mouth daily.    Dispense:  90 tablet    Refill:  1   amLODipine  (NORVASC ) 5 MG tablet    Sig: Take 1 tablet (5 mg total) by mouth daily.    Dispense:  90 tablet    Refill:  3   LIVALO  1 MG TABS    Sig: Take 1 tablet (1 mg total) by mouth daily.    Dispense:  90 tablet    Refill:  3      Follow-up: Return in about 6 months (around 09/11/2024) for a follow-up visit.  Marolyn Noel, MD

## 2024-03-14 NOTE — Assessment & Plan Note (Signed)
  Microalb/Creat Ratio 50.4 - start Kerendia  - increase Kerendia  to 20 mg a day. Nephrology ref  New high glucose even 1 week after stopping steroids. CBG was 300. CBG 187 this am. Will watch - better

## 2024-03-14 NOTE — Assessment & Plan Note (Addendum)
  Microalb/Creat Ratio 50.4 - increase Kerendia  to 20 mg a day. Nephrology ref

## 2024-03-14 NOTE — Assessment & Plan Note (Signed)
 Poor control. SBP=150-180 at home. Added Norvasc  5 mg/d On Olmesartan  20 mg bid Microalb/Creat Ratio 50.4 - start Kerendia  - increase Kerendia  to 20 mg a day.  Nephrology ref

## 2024-03-14 NOTE — Assessment & Plan Note (Signed)
 Stable  Neurology ref was offered On Aricept  Discussed the need for a hearing test/possibly hearing aids

## 2024-03-14 NOTE — Assessment & Plan Note (Signed)
 Microalb/Creat Ratio 50.4 - start Kerendia  - increase Kerendia  to 20 mg a day. Nephrology ref

## 2024-03-14 NOTE — Assessment & Plan Note (Addendum)
 Poor statin tolerance Ok to re-start Livalo   if tolerated Not taking Zetia 

## 2024-03-19 ENCOUNTER — Encounter: Payer: Self-pay | Admitting: Internal Medicine

## 2024-03-21 ENCOUNTER — Encounter: Payer: Self-pay | Admitting: Pharmacist

## 2024-03-21 NOTE — Progress Notes (Signed)
 Pharmacy Quality Measure Review  This patient is appearing on a report for being at risk of failing the adherence measure for diabetes medications this calendar year.   Medication: Metformin  1000 mg Last fill date: 03/08/24 for 90 day supply  Insurance report was not up to date. No action needed at this time.   Darrelyn Drum, PharmD, BCPS, CPP Clinical Pharmacist Practitioner [facility] [phone]

## 2024-03-27 ENCOUNTER — Encounter: Payer: Self-pay | Admitting: Internal Medicine

## 2024-03-27 ENCOUNTER — Ambulatory Visit: Admitting: Internal Medicine

## 2024-03-27 ENCOUNTER — Ambulatory Visit: Payer: Self-pay | Admitting: Internal Medicine

## 2024-03-27 VITALS — BP 142/80 | HR 82 | Temp 98.3°F | Ht 66.25 in | Wt 145.0 lb

## 2024-03-27 DIAGNOSIS — N320 Bladder-neck obstruction: Secondary | ICD-10-CM

## 2024-03-27 DIAGNOSIS — R809 Proteinuria, unspecified: Secondary | ICD-10-CM | POA: Diagnosis not present

## 2024-03-27 DIAGNOSIS — R413 Other amnesia: Secondary | ICD-10-CM | POA: Diagnosis not present

## 2024-03-27 DIAGNOSIS — E1129 Type 2 diabetes mellitus with other diabetic kidney complication: Secondary | ICD-10-CM | POA: Diagnosis not present

## 2024-03-27 DIAGNOSIS — I10 Essential (primary) hypertension: Secondary | ICD-10-CM

## 2024-03-27 DIAGNOSIS — I25118 Atherosclerotic heart disease of native coronary artery with other forms of angina pectoris: Secondary | ICD-10-CM

## 2024-03-27 DIAGNOSIS — E1159 Type 2 diabetes mellitus with other circulatory complications: Secondary | ICD-10-CM | POA: Diagnosis not present

## 2024-03-27 DIAGNOSIS — N2889 Other specified disorders of kidney and ureter: Secondary | ICD-10-CM

## 2024-03-27 DIAGNOSIS — E118 Type 2 diabetes mellitus with unspecified complications: Secondary | ICD-10-CM | POA: Diagnosis not present

## 2024-03-27 DIAGNOSIS — Z7984 Long term (current) use of oral hypoglycemic drugs: Secondary | ICD-10-CM | POA: Diagnosis not present

## 2024-03-27 DIAGNOSIS — I209 Angina pectoris, unspecified: Secondary | ICD-10-CM

## 2024-03-27 LAB — COMPREHENSIVE METABOLIC PANEL WITH GFR
ALT: 17 U/L (ref 0–53)
AST: 15 U/L (ref 0–37)
Albumin: 4.7 g/dL (ref 3.5–5.2)
Alkaline Phosphatase: 46 U/L (ref 39–117)
BUN: 17 mg/dL (ref 6–23)
CO2: 27 meq/L (ref 19–32)
Calcium: 10.1 mg/dL (ref 8.4–10.5)
Chloride: 100 meq/L (ref 96–112)
Creatinine, Ser: 0.92 mg/dL (ref 0.40–1.50)
GFR: 82.46 mL/min (ref >60.00)
Glucose, Bld: 151 mg/dL — ABNORMAL HIGH (ref 70–99)
Potassium: 4.5 meq/L (ref 3.5–5.1)
Sodium: 138 meq/L (ref 135–145)
Total Bilirubin: 0.4 mg/dL (ref 0.2–1.2)
Total Protein: 6.9 g/dL (ref 6.0–8.3)

## 2024-03-27 LAB — PSA: PSA: 3.45 ng/mL (ref 0.10–4.00)

## 2024-03-27 NOTE — Assessment & Plan Note (Signed)
 Hard physical work

## 2024-03-27 NOTE — Assessment & Plan Note (Signed)
 Check PSA. ?

## 2024-03-27 NOTE — Assessment & Plan Note (Signed)
 Stable On Aricept  Discussed the need for a hearing test/possibly hearing aids

## 2024-03-27 NOTE — Assessment & Plan Note (Signed)
 Heat stroke prevention was discussed

## 2024-03-27 NOTE — Assessment & Plan Note (Signed)
 Better control. SBP=140 at home. On Olmesartan  20 mg bid, Norvasc  5 mg/d Microalb/Creat Ratio 50.4 - start Kerendia  (not taking) Nephrology ref

## 2024-03-27 NOTE — Assessment & Plan Note (Signed)
  Microalb/Creat Ratio 50.4 - start Kerendia  - increase Kerendia  to 20 mg a day. Nephrology ref  New high glucose even 1 week after stopping steroids. CBG was 300. CBG 187 this am. Will watch - better

## 2024-03-27 NOTE — Assessment & Plan Note (Signed)
  Microalb/Creat Ratio 50.4 - increase Kerendia  to 20 mg a day. Nephrology ref was made Not taking Kerendia  - pls start

## 2024-03-27 NOTE — Patient Instructions (Signed)
 March 14, 2024   [facility] [address] [place] [address]      Dear Ryan Ferguson:   After careful review of the referral order placed by your physician, your referral has been processed for routing. See below for additional details. If you have questions regarding why this referral was placed, please contact the referring provider at [phone].      Appointment Scheduling:   We recognize that you are likely looking forward to scheduling your appointment.  Your referral has been sent to the appropriate office for scheduling. Usually, it takes 3-5 business days to process referrals once they are received.  Please allow this time for the office handling your referral to contact you directly to arrange your appointment.  If you have not been contacted within 5 business days, please call [phone] to make an appointment.            Information for Referral #: [id number]   Diagnoses:   N18.2 (ICD-10-CM) - CRI (chronic renal insufficiency), stage 2 (mild)   E11.8 (ICD-10-CM) - DM (diabetes mellitus), type 2 with complications (HCC)   E11.29,R80.9 (ICD-10-CM) - Microalbuminuria due to type 2 diabetes mellitus (HCC)   Procedures: REF45 (Custom) - AMB REFERRAL TO NEPHROLOGY (Canceled) REF45 (Custom) - AMB REFERRAL TO NEPHROLOGY Authorization #:     Referring Provider Information Seraiah Nowack V [address] [place] [address]  [phone] Referring To Provider Information [facility] [address] [place] [address] [phone]  Referral Start Date: 03/14/2024 Referral End Date: 03/14/2025       . Type Date User Summary Attachment  General 03/14/2024  3:37 PM Orlando Ronal POUR Faxed to -  Note: Faxed to [facility] [address] [place] [address] [phone] . Type Date User Summary Attachment  Provider Comments 03/14/2024  9:44 AM Kymani Laursen, Karlynn GAILS, MD Provider Comments -  Note: Dx DM/CRI,  nephropathy. Uncontrolled HTN. Dr Gearline Thx . Type Date User Summary Attachment  Provider Comments 03/14/2024  9:44 AM Marquin Patino, Karlynn GAILS, MD Provider Comments -  Note: Dx DM/CRI, nephropathy. Dr Gearline [place]

## 2024-03-27 NOTE — Assessment & Plan Note (Signed)
 F/u w/Cardiology He still [place] a lot of time [place] outside. On Pitavastatin  - will hold due to memory issue Consider Repatha PharmD consult Avoid high heat

## 2024-03-27 NOTE — Progress Notes (Signed)
 Subjective:  Patient ID: Ryan Ferguson, male    DOB: Feb 10, 1951  Age: 73 y.o. MRN: [id number]  CC: No chief complaint on file.   HPI Ryan Ferguson presents for HTN SBP at home 140s F/u on DM, CRI  Outpatient Medications Prior to Visit  Medication Sig Dispense Refill   amLODipine  (NORVASC ) 5 MG tablet Take 1 tablet (5 mg total) by mouth daily. 90 tablet 3   aspirin  EC 81 MG tablet Take 1 tablet (81 mg total) by mouth daily. 90 tablet 3   blood glucose meter kit and supplies KIT Use One Touch Verio to check blood sugars 2 times a day. 1 each 0   cholecalciferol (VITAMIN D) 1000 UNITS tablet Take 1,000 Units by mouth daily.     donepezil  (ARICEPT ) 10 MG tablet Take 1 tablet (10 mg total) by mouth at bedtime. 90 tablet 3   famotidine  (PEPCID ) 40 MG tablet Take 1 tablet by mouth once daily 90 tablet 0   Finerenone  (KERENDIA ) 20 MG TABS Take 1 tablet (20 mg total) by mouth daily. 90 tablet 1   levothyroxine  (SYNTHROID ) 112 MCG tablet TAKE 1 TABLET BY MOUTH IN THE MORNING 90 tablet 0   LIVALO  1 MG TABS Take 1 tablet (1 mg total) by mouth daily. 90 tablet 3   metFORMIN  (GLUCOPHAGE ) 1000 MG tablet Take 1 tablet by mouth twice daily 180 tablet 3   nitroGLYCERIN  (NITROSTAT ) 0.4 MG SL tablet DISSOLVE ONE TABLET UNDER THE TONGUE EVERY 5 MINUTES AS NEEDED FOR CHEST PAIN.  DO NOT EXCEED A TOTAL OF 3 DOSES IN 15 MINUTES 50 tablet 0   olmesartan  (BENICAR ) 20 MG tablet Take 1 tablet (20 mg total) by mouth 2 (two) times daily. 180 tablet 3   ondansetron  (ZOFRAN  ODT) 4 MG disintegrating tablet Take 1 tablet (4 mg total) by mouth every 8 (eight) hours as needed for nausea or vomiting. 20 tablet 0   ONE TOUCH ULTRA TEST test strip USE TEST STRIPS TO TEST BLOOD SUGAR ONCE DAILY  4   predniSONE  (STERAPRED UNI-PAK 21 TAB) 10 MG (21) TBPK tablet Use as directed. 21 each 0   tamsulosin  (FLOMAX ) 0.4 MG CAPS capsule Take 1 capsule (0.4 mg total) by mouth daily. 7 capsule 0   traMADol  (ULTRAM ) 50 MG tablet  Take 1-2 tablets (50-100 mg total) by mouth every 6 (six) hours as needed for moderate pain (pain score 4-6). 15 tablet 0   zolpidem  (AMBIEN ) 10 MG tablet Take 0.5-1 tablets (5-10 mg total) by mouth at bedtime as needed for sleep. 30 tablet 3   No facility-administered medications prior to visit.    ROS: Review of Systems  Constitutional:  Negative for appetite change, fatigue and unexpected weight change.  HENT:  Negative for congestion, nosebleeds, sneezing, sore throat and trouble swallowing.   Eyes:  Negative for itching and visual disturbance.  Respiratory:  Negative for cough.   Cardiovascular:  Negative for chest pain, palpitations and leg swelling.  Gastrointestinal:  Negative for abdominal distention, blood in stool, diarrhea and nausea.  Genitourinary:  Negative for frequency and hematuria.  Musculoskeletal:  Negative for back pain, gait problem, joint swelling and neck pain.  Skin:  Negative for rash.  Neurological:  Negative for dizziness, tremors, speech difficulty and weakness.  Psychiatric/Behavioral:  Negative for agitation, dysphoric mood and sleep disturbance. The patient is not nervous/anxious.     Objective:  BP (!) 142/80 (BP Location: Left Arm, Patient Position: Sitting, Cuff Size: Normal)  Pulse 82   Temp 98.3 F (36.8 C) (Oral)   Ht 5' 6.25 (1.683 m)   Wt 145 lb (65.8 kg)   SpO2 98%   BMI 23.23 kg/m   BP Readings from Last 3 Encounters:  03/27/24 (!) 142/80  03/14/24 (!) 150/86  12/26/23 117/60    Wt Readings from Last 3 Encounters:  03/27/24 145 lb (65.8 kg)  03/14/24 144 lb 9.6 oz (65.6 kg)  12/26/23 149 lb (67.6 kg)    Physical Exam Constitutional:      General: He is not in acute distress.    Appearance: He is well-developed.     Comments: NAD  Eyes:     Conjunctiva/sclera: Conjunctivae normal.     Pupils: Pupils are equal, round, and reactive to light.  Neck:     Thyroid : No thyromegaly.     Vascular: No JVD.  Cardiovascular:      Rate and Rhythm: Normal rate and regular rhythm.     Heart sounds: Normal heart sounds. No murmur heard.    No friction rub. No gallop.  Pulmonary:     Effort: Pulmonary effort is normal. No respiratory distress.     Breath sounds: Normal breath sounds. No wheezing or rales.  Chest:     Chest wall: No tenderness.  Abdominal:     General: Bowel sounds are normal. There is no distension.     Palpations: Abdomen is soft. There is no mass.     Tenderness: There is no abdominal tenderness. There is no guarding or rebound.  Musculoskeletal:        General: No tenderness. Normal range of motion.     Cervical back: Normal range of motion.  Lymphadenopathy:     Cervical: No cervical adenopathy.  Skin:    General: Skin is warm and dry.     Findings: No rash.  Neurological:     Mental Status: He is alert and oriented to person, place, and time.     Cranial Nerves: No cranial nerve deficit.     Motor: No abnormal muscle tone.     Coordination: Coordination normal.     Gait: Gait normal.     Deep Tendon Reflexes: Reflexes are normal and symmetric.  Psychiatric:        Behavior: Behavior normal.        Thought Content: Thought content normal.        Judgment: Judgment normal.     Lab Results  Component Value Date   WBC 5.4 03/13/2024   HGB 13.7 03/13/2024   HCT 41.6 03/13/2024   PLT 234.0 03/13/2024   GLUCOSE 116 (H) 03/13/2024   CHOL 157 09/06/2022   TRIG 93.0 09/06/2022   HDL 48.90 09/06/2022   LDLCALC 89 09/06/2022   ALT 16 03/13/2024   AST 15 03/13/2024   NA 138 03/13/2024   K 4.3 03/13/2024   CL 101 03/13/2024   CREATININE 1.24 03/13/2024   BUN 26 (H) 03/13/2024   CO2 26 03/13/2024   TSH 5.42 06/28/2023   PSA 2.17 03/26/2020   HGBA1C 7.6 (H) 03/13/2024   MICROALBUR 5.7 (H) 03/13/2024    DG Abd 1 View Result Date: 09/05/2023 CLINICAL DATA:  Right ureteral calculus, preop. EXAM: ABDOMEN - 1 VIEW COMPARISON:  Most recent radiograph 08/30/2023.  CT 05/31/2023 FINDINGS:  7 mm calcification projecting over the right sacrum is likely distal migration of ureteral calculus. Additional stable pelvic calcifications correspond to phleboliths and arterial vascular calcifications. Nonobstructive bowel gas pattern. Small volume  of colonic stool. IMPRESSION: A 7 mm calcification projecting over the right sacrum is likely distal migration of ureteral calculus. Electronically Signed   By: Andrea Gasman M.D.   On: 09/05/2023 10:10    Assessment & Plan:   Problem List Items Addressed This Visit     Angina pectoris associated with type 2 diabetes mellitus (HCC)   Heat stroke prevention was discussed      Bladder neck obstruction   Check PSA      Relevant Orders   PSA   CAD (coronary artery disease), native coronary artery   F/u w/Cardiology He still spends a lot of time in [place] woods working outside. On Pitavastatin  - will hold due to memory issue Consider Repatha PharmD consult Avoid high heat       CRI (chronic renal insufficiency), stage 2 (mild)    Microalb/Creat Ratio 50.4 - increase Kerendia  to 20 mg a day. Nephrology ref was made Not taking Kerendia  - pls start      Relevant Orders   Comprehensive metabolic panel with GFR   PSA   DM (diabetes mellitus), type 2 with complications (HCC)    Microalb/Creat Ratio 50.4 - start Kerendia  - increase Kerendia  to 20 mg a day. Nephrology ref  New high glucose even 1 week after stopping steroids. CBG was 300. CBG 187 this am. Will watch - better      Hypertension   Better control. SBP=140 at home. On Olmesartan  20 mg bid, Norvasc  5 mg/d Microalb/Creat Ratio 50.4 - start Kerendia  (not taking) Nephrology ref       Memory loss   Stable On Aricept  Discussed the need for a hearing test/possibly hearing aids      Microalbuminuria   Hard physical work      Microalbuminuria due to type 2 diabetes mellitus (HCC) - Primary   Microalb/Creat Ratio 50.4 - re-start Kerendia  - .  Nephrology ref          No orders of the defined types were placed in this encounter.     Follow-up: Return in about 3 months (around 06/27/2024) for a follow-up visit.  Marolyn Noel, MD

## 2024-03-27 NOTE — Assessment & Plan Note (Signed)
 Microalb/Creat Ratio 50.4 - re-start Kerendia  - . Nephrology ref

## 2024-03-31 ENCOUNTER — Other Ambulatory Visit: Payer: Self-pay | Admitting: Internal Medicine

## 2024-04-11 ENCOUNTER — Other Ambulatory Visit (HOSPITAL_COMMUNITY): Payer: Self-pay

## 2024-04-11 ENCOUNTER — Telehealth: Payer: Self-pay

## 2024-04-11 NOTE — Telephone Encounter (Signed)
 Pharmacy Patient Advocate Encounter   Received notification from Patient Pharmacy that prior authorization for Livalo  1mg  tab is required/requested.   Insurance verification completed.   The patient is insured through [facility] ADVANTAGE/RX ADVANCE.   Per test claim: PA required; PA submitted to above mentioned insurance via Latent Key/confirmation #/EOC [id number] Status is pending

## 2024-04-12 ENCOUNTER — Other Ambulatory Visit (HOSPITAL_COMMUNITY): Payer: Self-pay

## 2024-04-12 NOTE — Telephone Encounter (Signed)
 Pharmacy Patient Advocate Encounter  Received notification from [facility] ADVANTAGE/RX ADVANCE that Prior Authorization for Livalo  1mg  tab has been APPROVED from 04/11/24 to 06/20/24. Ran test claim, Copay is $250 for a 90 day supply. This test claim was processed through [facility]- copay amounts may vary at other pharmacies due to pharmacy/plan contracts, or as the patient moves through the different stages of their insurance plan.   PA #/Case ID/Reference #: [id number]  Left a message at [facility] to notify of the approval.

## 2024-04-13 NOTE — Telephone Encounter (Signed)
 Copied from CRM [id number]. Topic: General - Other >> Apr 13, 2024  1:03 PM Thersia BROCKS wrote: Reason for CRM: Patient wife, called in regarding needing to speak with someone regarding a message sent about samples, would like a callback today  [phone]

## 2024-04-16 DIAGNOSIS — R809 Proteinuria, unspecified: Secondary | ICD-10-CM | POA: Diagnosis not present

## 2024-04-16 DIAGNOSIS — N182 Chronic kidney disease, stage 2 (mild): Secondary | ICD-10-CM | POA: Diagnosis not present

## 2024-04-16 DIAGNOSIS — E1122 Type 2 diabetes mellitus with diabetic chronic kidney disease: Secondary | ICD-10-CM | POA: Diagnosis not present

## 2024-04-16 DIAGNOSIS — N200 Calculus of kidney: Secondary | ICD-10-CM | POA: Diagnosis not present

## 2024-04-16 DIAGNOSIS — I129 Hypertensive chronic kidney disease with stage 1 through stage 4 chronic kidney disease, or unspecified chronic kidney disease: Secondary | ICD-10-CM | POA: Diagnosis not present

## 2024-04-17 DIAGNOSIS — E119 Type 2 diabetes mellitus without complications: Secondary | ICD-10-CM | POA: Diagnosis not present

## 2024-04-17 DIAGNOSIS — M2041 Other hammer toe(s) (acquired), right foot: Secondary | ICD-10-CM | POA: Diagnosis not present

## 2024-04-17 DIAGNOSIS — M19079 Primary osteoarthritis, unspecified ankle and foot: Secondary | ICD-10-CM | POA: Diagnosis not present

## 2024-04-17 DIAGNOSIS — L853 Xerosis cutis: Secondary | ICD-10-CM | POA: Diagnosis not present

## 2024-04-17 DIAGNOSIS — I739 Peripheral vascular disease, unspecified: Secondary | ICD-10-CM | POA: Diagnosis not present

## 2024-04-17 DIAGNOSIS — L600 Ingrowing nail: Secondary | ICD-10-CM | POA: Diagnosis not present

## 2024-04-17 LAB — LAB REPORT - SCANNED
Albumin, Urine POC: 15.3
Creatinine, POC: 28.9 mg/dL
EGFR: 87
Microalb Creat Ratio: 53

## 2024-04-19 ENCOUNTER — Other Ambulatory Visit: Payer: Self-pay | Admitting: Internal Medicine

## 2024-04-19 MED ORDER — PITAVASTATIN MAGNESIUM 4 MG PO TABS: 4.0000 mg | ORAL_TABLET | Freq: Every day | ORAL | 11 refills | Status: DC

## 2024-04-30 DIAGNOSIS — N182 Chronic kidney disease, stage 2 (mild): Secondary | ICD-10-CM | POA: Diagnosis not present

## 2024-04-30 DIAGNOSIS — I129 Hypertensive chronic kidney disease with stage 1 through stage 4 chronic kidney disease, or unspecified chronic kidney disease: Secondary | ICD-10-CM | POA: Diagnosis not present

## 2024-05-01 DIAGNOSIS — L600 Ingrowing nail: Secondary | ICD-10-CM | POA: Diagnosis not present

## 2024-05-01 DIAGNOSIS — B351 Tinea unguium: Secondary | ICD-10-CM | POA: Diagnosis not present

## 2024-05-01 DIAGNOSIS — I739 Peripheral vascular disease, unspecified: Secondary | ICD-10-CM | POA: Diagnosis not present

## 2024-05-01 DIAGNOSIS — M19079 Primary osteoarthritis, unspecified ankle and foot: Secondary | ICD-10-CM | POA: Diagnosis not present

## 2024-05-01 DIAGNOSIS — L03119 Cellulitis of unspecified part of limb: Secondary | ICD-10-CM | POA: Diagnosis not present

## 2024-05-01 DIAGNOSIS — L853 Xerosis cutis: Secondary | ICD-10-CM | POA: Diagnosis not present

## 2024-05-01 DIAGNOSIS — M2041 Other hammer toe(s) (acquired), right foot: Secondary | ICD-10-CM | POA: Diagnosis not present

## 2024-05-01 DIAGNOSIS — E119 Type 2 diabetes mellitus without complications: Secondary | ICD-10-CM | POA: Diagnosis not present

## 2024-05-01 DIAGNOSIS — L84 Corns and callosities: Secondary | ICD-10-CM | POA: Diagnosis not present

## 2024-06-28 ENCOUNTER — Ambulatory Visit: Admitting: Internal Medicine

## 2024-06-28 ENCOUNTER — Encounter: Payer: Self-pay | Admitting: Internal Medicine

## 2024-06-28 ENCOUNTER — Ambulatory Visit: Payer: Self-pay | Admitting: Internal Medicine

## 2024-06-28 VITALS — BP 136/70 | HR 61 | Temp 97.9°F | Ht 66.0 in | Wt 147.0 lb

## 2024-06-28 DIAGNOSIS — R350 Frequency of micturition: Secondary | ICD-10-CM | POA: Diagnosis not present

## 2024-06-28 DIAGNOSIS — L659 Nonscarring hair loss, unspecified: Secondary | ICD-10-CM | POA: Diagnosis not present

## 2024-06-28 DIAGNOSIS — N400 Enlarged prostate without lower urinary tract symptoms: Secondary | ICD-10-CM | POA: Insufficient documentation

## 2024-06-28 DIAGNOSIS — E118 Type 2 diabetes mellitus with unspecified complications: Secondary | ICD-10-CM | POA: Diagnosis not present

## 2024-06-28 DIAGNOSIS — I10 Essential (primary) hypertension: Secondary | ICD-10-CM

## 2024-06-28 DIAGNOSIS — N2889 Other specified disorders of kidney and ureter: Secondary | ICD-10-CM | POA: Diagnosis not present

## 2024-06-28 DIAGNOSIS — N401 Enlarged prostate with lower urinary tract symptoms: Secondary | ICD-10-CM | POA: Diagnosis not present

## 2024-06-28 LAB — COMPREHENSIVE METABOLIC PANEL WITH GFR
ALT: 21 U/L (ref 3–53)
AST: 16 U/L (ref 5–37)
Albumin: 4.5 g/dL (ref 3.5–5.2)
Alkaline Phosphatase: 54 U/L (ref 39–117)
BUN: 29 mg/dL — ABNORMAL HIGH (ref 6–23)
CO2: 27 meq/L (ref 19–32)
Calcium: 9.8 mg/dL (ref 8.4–10.5)
Chloride: 101 meq/L (ref 96–112)
Creatinine, Ser: 1.11 mg/dL (ref 0.40–1.50)
GFR: 65.71 mL/min
Glucose, Bld: 146 mg/dL — ABNORMAL HIGH (ref 70–99)
Potassium: 4.1 meq/L (ref 3.5–5.1)
Sodium: 137 meq/L (ref 135–145)
Total Bilirubin: 0.4 mg/dL (ref 0.2–1.2)
Total Protein: 7.1 g/dL (ref 6.0–8.3)

## 2024-06-28 LAB — HEMOGLOBIN A1C: Hgb A1c MFr Bld: 6.6 % — ABNORMAL HIGH (ref 4.6–6.5)

## 2024-06-28 MED ORDER — FINASTERIDE 5 MG PO TABS: 5.0000 mg | ORAL_TABLET | Freq: Every day | ORAL | 3 refills | Status: AC

## 2024-06-28 NOTE — Assessment & Plan Note (Addendum)
" °  Microalb/Creat Ratio 50.4 - increase Kerendia  to 20 mg a day. Nephrology ref was made Taking Kerendia  - propbably "

## 2024-06-28 NOTE — Patient Instructions (Addendum)
 Cinnamon and apple cider vinegar capsules   Recent large-scale observational studies provide strong evidence that the shingles vaccine is associated with a reduced risk of developing dementia. It may also help slow cognitive decline in individuals who already have dementia.  Key Findings from Recent Research Multiple natural experiment studies, which leveraged unique vaccination policies to minimize bias, have consistently found a protective link:  Reduced Risk: One large study, published in [employer], analyzed health records from over 280,000 older adults in [place] and found that those who received the live-attenuated shingles vaccine (Zostavax, now discontinued in the [place] ) were approximately 20% less likely to develop dementia over a seven-year follow-up period. Slower Progression: A follow-up study published in Cell indicated that for those already living with dementia, the vaccine appeared to slow the progression of the disease and reduced dementia-related deaths by nearly half over nine years. Vascular Dementia Protection: Other research presented at [facility] 2025 indicated that the vaccine lowered the risk of vascular dementia by 50%. Newer Vaccine: A separate study published in [employer] suggested that the newer, more effective recombinant vaccine (Shingrix, currently used in the [place] ) also offers significant protection against dementia, with an association of lower risk than the older vaccine type. Stronger Effect in Women: Several studies noted that the protective effect against dementia was more pronounced in women than in men, possibly due to differences in immune responses or dementia pathogenesis.  Why Might the Vaccine Help? Researchers are still working to determine the exact mechanism, but current theories center on the idea that preventing the shingles virus (varicella-zoster) from reactivating helps protect the brain:  Reduced Inflammation: The most likely explanation is that the vaccine  prevents shingles infections and subsequent inflammation in the nervous system, which is a known risk factor for neurodegenerative diseases like dementia. Immune System Modulation: It's also possible that the vaccine boosts the immune system more broadly, helping the body combat other processes related to cognitive decline.  Important Considerations Observational Data: While the evidence is strong, these findings primarily come from observational studies, which show an association, not a definitive cause-and-effect relationship. A large-scale randomized controlled trial is the gold standard for conclusive proof, but such trials are difficult to conduct for dementia due to the time and cost involved. Public Health Recommendation: The [place].S. Centers for Disease Control and Prevention (CDC) already recommends the two-dose Shingrix vaccine for all healthy adults aged 53 and older primarily to prevent shingles and its painful complications. The potential added benefit for dementia prevention provides another compelling reason to get vaccinated.

## 2024-06-28 NOTE — Assessment & Plan Note (Signed)
 C/o CBG 140 fasting , post-prandial 180-190 in Jan 2026 Add Cinnamon and apple cider vinegar capsules

## 2024-06-28 NOTE — Assessment & Plan Note (Signed)
 Start Finasteride .

## 2024-06-28 NOTE — Assessment & Plan Note (Signed)
 Better control. SBP=140 at home. On Olmesartan  20 mg bid, Norvasc  5 mg/d Microalb/Creat Ratio 50.4 - start Kerendia  (not taking) Nephrology ref

## 2024-06-28 NOTE — Assessment & Plan Note (Signed)
 Start Finasteride . On Flomax  Treat DM

## 2024-06-28 NOTE — Progress Notes (Signed)
 "  Subjective:  Patient ID: Ryan Ferguson, male    DOB: 03-22-1951  Age: 74 y.o. MRN: [id number]  CC: Follow-up (Patient has questions about blood pressure, prostate and a couple more questions. )   HPI Ryan Ferguson presents for DM, HTN, hypothyroidism C/o CBG 140 fasting , post-prandial 180-190 in Jan 2026  Outpatient Medications Prior to Visit  Medication Sig Dispense Refill   amLODipine  (NORVASC ) 5 MG tablet Take 1 tablet (5 mg total) by mouth daily. 90 tablet 3   aspirin  EC 81 MG tablet Take 1 tablet (81 mg total) by mouth daily. 90 tablet 3   blood glucose meter kit and supplies KIT Use One Touch Verio to check blood sugars 2 times a day. 1 each 0   cholecalciferol (VITAMIN D) 1000 UNITS tablet Take 1,000 Units by mouth daily.     donepezil  (ARICEPT ) 10 MG tablet Take 1 tablet (10 mg total) by mouth at bedtime. 90 tablet 3   famotidine  (PEPCID ) 40 MG tablet Take 1 tablet by mouth once daily 90 tablet 0   Finerenone  (KERENDIA ) 20 MG TABS Take 1 tablet (20 mg total) by mouth daily. 90 tablet 1   levothyroxine  (SYNTHROID ) 112 MCG tablet TAKE 1 TABLET BY MOUTH IN THE MORNING 90 tablet 3   LIVALO  1 MG TABS Take 1 tablet (1 mg total) by mouth daily. 90 tablet 3   metFORMIN  (GLUCOPHAGE ) 1000 MG tablet Take 1 tablet by mouth twice daily 180 tablet 3   nitroGLYCERIN  (NITROSTAT ) 0.4 MG SL tablet DISSOLVE ONE TABLET UNDER THE TONGUE EVERY 5 MINUTES AS NEEDED FOR CHEST PAIN.  DO NOT EXCEED A TOTAL OF 3 DOSES IN 15 MINUTES 50 tablet 0   olmesartan  (BENICAR ) 20 MG tablet Take 1 tablet (20 mg total) by mouth 2 (two) times daily. 180 tablet 3   ondansetron  (ZOFRAN  ODT) 4 MG disintegrating tablet Take 1 tablet (4 mg total) by mouth every 8 (eight) hours as needed for nausea or vomiting. 20 tablet 0   ONE TOUCH ULTRA TEST test strip USE TEST STRIPS TO TEST BLOOD SUGAR ONCE DAILY  4   Pitavastatin  Magnesium  4 MG TABS Take 1 tablet (4 mg total) by mouth daily. 30 tablet 11   predniSONE  (STERAPRED  UNI-PAK 21 TAB) 10 MG (21) TBPK tablet Use as directed. 21 each 0   tamsulosin  (FLOMAX ) 0.4 MG CAPS capsule Take 1 capsule (0.4 mg total) by mouth daily. 7 capsule 0   traMADol  (ULTRAM ) 50 MG tablet Take 1-2 tablets (50-100 mg total) by mouth every 6 (six) hours as needed for moderate pain (pain score 4-6). 15 tablet 0   zolpidem  (AMBIEN ) 10 MG tablet Take 0.5-1 tablets (5-10 mg total) by mouth at bedtime as needed for sleep. 30 tablet 3   No facility-administered medications prior to visit.    ROS: Review of Systems  Constitutional:  Negative for appetite change, fatigue and unexpected weight change.  HENT:  Negative for congestion, nosebleeds, sneezing, sore throat and trouble swallowing.   Eyes:  Negative for itching and visual disturbance.  Respiratory:  Negative for cough.   Cardiovascular:  Negative for chest pain, palpitations and leg swelling.  Gastrointestinal:  Negative for abdominal distention, blood in stool, diarrhea and nausea.  Genitourinary:  Negative for frequency and hematuria.  Musculoskeletal:  Negative for back pain, gait problem, joint swelling and neck pain.  Skin:  Negative for rash.  Neurological:  Negative for dizziness, tremors, speech difficulty and weakness.  Psychiatric/Behavioral:  Negative  for agitation, dysphoric mood and sleep disturbance. The patient is not nervous/anxious.     Objective:  BP 136/70   Pulse 61   Temp 97.9 F (36.6 C) (Oral)   Ht 5' 6 (1.676 m)   Wt 147 lb (66.7 kg)   SpO2 96%   BMI 23.73 kg/m   BP Readings from Last 3 Encounters:  06/28/24 136/70  03/27/24 (!) 142/80  03/14/24 (!) 150/86    Wt Readings from Last 3 Encounters:  06/28/24 147 lb (66.7 kg)  03/27/24 145 lb (65.8 kg)  03/14/24 144 lb 9.6 oz (65.6 kg)    Physical Exam Constitutional:      General: He is not in acute distress.    Appearance: Normal appearance. He is well-developed.     Comments: NAD  Eyes:     Conjunctiva/sclera: Conjunctivae normal.      Pupils: Pupils are equal, round, and reactive to light.  Neck:     Thyroid : No thyromegaly.     Vascular: No JVD.  Cardiovascular:     Rate and Rhythm: Normal rate and regular rhythm.     Heart sounds: Normal heart sounds. No murmur heard.    No friction rub. No gallop.  Pulmonary:     Effort: Pulmonary effort is normal. No respiratory distress.     Breath sounds: Normal breath sounds. No wheezing or rales.  Chest:     Chest wall: No tenderness.  Abdominal:     General: Bowel sounds are normal. There is no distension.     Palpations: Abdomen is soft. There is no mass.     Tenderness: There is no abdominal tenderness. There is no guarding or rebound.  Musculoskeletal:        General: No tenderness. Normal range of motion.     Cervical back: Normal range of motion.  Lymphadenopathy:     Cervical: No cervical adenopathy.  Skin:    General: Skin is warm and dry.     Findings: No rash.  Neurological:     Mental Status: He is alert and oriented to person, place, and time.     Cranial Nerves: No cranial nerve deficit.     Motor: No abnormal muscle tone.     Coordination: Coordination normal.     Gait: Gait normal.     Deep Tendon Reflexes: Reflexes are normal and symmetric.  Psychiatric:        Behavior: Behavior normal.        Thought Content: Thought content normal.        Judgment: Judgment normal.     Lab Results  Component Value Date   WBC 5.4 03/13/2024   HGB 13.7 03/13/2024   HCT 41.6 03/13/2024   PLT 234.0 03/13/2024   GLUCOSE 151 (H) 03/27/2024   CHOL 157 09/06/2022   TRIG 93.0 09/06/2022   HDL 48.90 09/06/2022   LDLCALC 89 09/06/2022   ALT 17 03/27/2024   AST 15 03/27/2024   NA 138 03/27/2024   K 4.5 03/27/2024   CL 100 03/27/2024   CREATININE 0.92 03/27/2024   BUN 17 03/27/2024   CO2 27 03/27/2024   TSH 5.42 06/28/2023   PSA 3.45 03/27/2024   HGBA1C 7.6 (H) 03/13/2024   MICROALBUR 5.7 (H) 03/13/2024    DG Abd 1 View Result Date:  09/05/2023 CLINICAL DATA:  Right ureteral calculus, preop. EXAM: ABDOMEN - 1 VIEW COMPARISON:  Most recent radiograph 08/30/2023.  CT 05/31/2023 FINDINGS: 7 mm calcification projecting over the right sacrum is  likely distal migration of ureteral calculus. Additional stable pelvic calcifications correspond to phleboliths and arterial vascular calcifications. Nonobstructive bowel gas pattern. Small volume of colonic stool. IMPRESSION: A 7 mm calcification projecting over the right sacrum is likely distal migration of ureteral calculus. Electronically Signed   By: Andrea Gasman M.D.   On: 09/05/2023 10:10    Assessment & Plan:   Problem List Items Addressed This Visit     DM (diabetes mellitus), type 2 with complications (HCC) - Primary   C/o CBG 140 fasting , post-prandial 180-190 in Jan 2026 Add Cinnamon and apple cider vinegar capsules       Relevant Orders   Comprehensive metabolic panel with GFR   Hemoglobin A1c   Hypertension   Better control. SBP=140 at home. On Olmesartan  20 mg bid, Norvasc  5 mg/d Microalb/Creat Ratio 50.4 - start Kerendia  (not taking) Nephrology ref       CRI (chronic renal insufficiency), stage 2 (mild)    Microalb/Creat Ratio 50.4 - increase Kerendia  to 20 mg a day. Nephrology ref was made Taking Kerendia  - propbably      Hair loss   Start Finasteride       BPH (benign prostatic hyperplasia)   Start Finasteride . On Flomax  Treat DM      Relevant Medications   finasteride  (PROSCAR ) 5 MG tablet      Meds ordered this encounter  Medications   finasteride  (PROSCAR ) 5 MG tablet    Sig: Take 1 tablet (5 mg total) by mouth daily.    Dispense:  90 tablet    Refill:  3      Follow-up: Return in about 3 months (around 09/26/2024) for a follow-up visit.  Marolyn Noel, MD "

## 2024-07-12 ENCOUNTER — Encounter: Payer: Self-pay | Admitting: *Deleted

## 2024-07-12 NOTE — Progress Notes (Signed)
 Ryan Ferguson                                          MRN: [id number]   07/12/2024   The [facility] Quality Team Specialist reviewed this patient medical record for the purposes of chart review for care gap closure. The following were reviewed: chart review for care gap closure-controlling blood pressure.    [facility] Quality Team

## 2024-07-17 ENCOUNTER — Other Ambulatory Visit: Payer: Self-pay | Admitting: Internal Medicine

## 2024-07-17 MED ORDER — PITAVASTATIN MAGNESIUM 4 MG PO TABS: 4.0000 mg | ORAL_TABLET | Freq: Every day | ORAL | 11 refills | Status: AC

## 2024-07-24 ENCOUNTER — Telehealth: Payer: Self-pay

## 2024-07-24 NOTE — Telephone Encounter (Signed)
 Copied from CRM [id number]. Topic: Clinical - Medication Question >> Jul 24, 2024  3:14 PM Donna BRAVO wrote: Reason for CRM: Patient wife stating insurance will pay for the FreeStyle Libre 3 Plus sensor, and asking to have prescription sent to:  [facility] [address] [place], [place] - [address] [place] [address] Phone: [phone] Fax: [phone] Hours: Not open 24 hours

## 2024-07-26 ENCOUNTER — Other Ambulatory Visit: Payer: Self-pay | Admitting: Internal Medicine

## 2024-07-26 MED ORDER — FREESTYLE LIBRE 3 READER DEVI: 1 refills | Status: DC

## 2024-07-26 MED ORDER — FREESTYLE LIBRE 3 PLUS SENSOR MISC: 3 refills | Status: AC

## 2024-07-26 NOTE — Telephone Encounter (Signed)
 Okay.  Thanks.

## 2024-07-26 NOTE — Addendum Note (Signed)
 Addended by: Jahlani Lorentz V on: 07/26/2024 07:44 AM   Modules accepted: Orders

## 2024-07-27 ENCOUNTER — Other Ambulatory Visit: Payer: Self-pay | Admitting: Internal Medicine

## 2024-07-27 MED ORDER — FREESTYLE LIBRE 3 READER DEVI: 1 refills | Status: AC

## 2024-07-27 NOTE — Telephone Encounter (Signed)
 Medication rewritten with new sig as old sig was not adequate for insurance coverage (noted by pharmacy)

## 2024-10-01 ENCOUNTER — Ambulatory Visit: Admitting: Internal Medicine

## 2024-11-21 ENCOUNTER — Ambulatory Visit
